# Patient Record
Sex: Male | Born: 1955 | Race: White | Hispanic: No | Marital: Married | Smoking: Former smoker
Health system: Southern US, Community
[De-identification: ages and names within clinical notes are randomized; demographics above are authoritative.]

## PROBLEM LIST (undated history)

## (undated) DIAGNOSIS — J302 Other seasonal allergic rhinitis: Secondary | ICD-10-CM

## (undated) DIAGNOSIS — N400 Enlarged prostate without lower urinary tract symptoms: Secondary | ICD-10-CM

## (undated) DIAGNOSIS — G8929 Other chronic pain: Secondary | ICD-10-CM

## (undated) DIAGNOSIS — I509 Heart failure, unspecified: Secondary | ICD-10-CM

## (undated) DIAGNOSIS — Z8711 Personal history of peptic ulcer disease: Secondary | ICD-10-CM

## (undated) DIAGNOSIS — R51 Headache: Secondary | ICD-10-CM

## (undated) DIAGNOSIS — M1990 Unspecified osteoarthritis, unspecified site: Secondary | ICD-10-CM

## (undated) DIAGNOSIS — R519 Headache, unspecified: Secondary | ICD-10-CM

## (undated) DIAGNOSIS — R011 Cardiac murmur, unspecified: Secondary | ICD-10-CM

## (undated) DIAGNOSIS — Z8719 Personal history of other diseases of the digestive system: Secondary | ICD-10-CM

## (undated) DIAGNOSIS — J329 Chronic sinusitis, unspecified: Secondary | ICD-10-CM

## (undated) DIAGNOSIS — G4733 Obstructive sleep apnea (adult) (pediatric): Secondary | ICD-10-CM

## (undated) DIAGNOSIS — J189 Pneumonia, unspecified organism: Secondary | ICD-10-CM

## (undated) DIAGNOSIS — J45909 Unspecified asthma, uncomplicated: Secondary | ICD-10-CM

## (undated) DIAGNOSIS — I2510 Atherosclerotic heart disease of native coronary artery without angina pectoris: Secondary | ICD-10-CM

## (undated) DIAGNOSIS — M542 Cervicalgia: Secondary | ICD-10-CM

## (undated) HISTORY — DX: Heart failure, unspecified: I50.9

## (undated) HISTORY — DX: Obstructive sleep apnea (adult) (pediatric): G47.33

## (undated) HISTORY — DX: Unspecified asthma, uncomplicated: J45.909

## (undated) HISTORY — PX: TONSILLECTOMY: SUR1361

## (undated) HISTORY — DX: Cardiac murmur, unspecified: R01.1

## (undated) HISTORY — DX: Unspecified osteoarthritis, unspecified site: M19.90

## (undated) HISTORY — PX: CARDIAC CATHETERIZATION: SHX172

---

## 1990-10-16 HISTORY — PX: SHOULDER CLOSED REDUCTION: SHX1051

## 2015-11-12 ENCOUNTER — Encounter: Payer: Self-pay | Admitting: Family Medicine

## 2015-11-16 ENCOUNTER — Encounter: Payer: Self-pay | Admitting: Family Medicine

## 2015-11-16 ENCOUNTER — Ambulatory Visit (INDEPENDENT_AMBULATORY_CARE_PROVIDER_SITE_OTHER): Payer: TRICARE For Life (TFL) | Admitting: Family Medicine

## 2015-11-16 VITALS — BP 119/77 | HR 94 | Temp 96.9°F | Ht 76.0 in | Wt 286.4 lb

## 2015-11-16 DIAGNOSIS — Z418 Encounter for other procedures for purposes other than remedying health state: Secondary | ICD-10-CM

## 2015-11-16 DIAGNOSIS — R5382 Chronic fatigue, unspecified: Secondary | ICD-10-CM

## 2015-11-16 DIAGNOSIS — R5383 Other fatigue: Secondary | ICD-10-CM | POA: Insufficient documentation

## 2015-11-16 DIAGNOSIS — G8929 Other chronic pain: Secondary | ICD-10-CM | POA: Diagnosis not present

## 2015-11-16 DIAGNOSIS — Z299 Encounter for prophylactic measures, unspecified: Secondary | ICD-10-CM

## 2015-11-16 DIAGNOSIS — N400 Enlarged prostate without lower urinary tract symptoms: Secondary | ICD-10-CM | POA: Diagnosis not present

## 2015-11-16 DIAGNOSIS — M791 Myalgia: Secondary | ICD-10-CM

## 2015-11-16 DIAGNOSIS — M7918 Myalgia, other site: Secondary | ICD-10-CM

## 2015-11-16 MED ORDER — DULOXETINE HCL 30 MG PO CPEP: ORAL_CAPSULE | ORAL | Status: DC

## 2015-11-16 NOTE — Patient Instructions (Addendum)
Great to meet you!  I have started you on cymbalta (duloxetine) for your pain. It is a slow and steady medicine,. I try to encourage patients to use it for at least 2 months prior to deciding it does not help.   Lets see you back in 3-4 weeks to discuss pain and review your labs.

## 2015-11-16 NOTE — Progress Notes (Signed)
   HPI  Patient presents today to establish care, and discussed a few chronic medical conditions.  He is retired from [employer], he did a full career there.  [facility]  He's had a colonoscopy 1 year ago without problems, he has had mucus in his stool since that time.  Today he has no complaints except for chronic musculoskeletal back pain due to arthritis. He has had a neck MRI at the [place] and been offered surgery, however they were trying epidural injections first. He will chart to get this MRI since our clinic.  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  His immunizations are up-to-date from the [employer], he does not have his records with him. His TB test is due.  Flomax is helping He is on a daily aspirin  PMH: Smoking status noted His past medical, surgical, social, family history reviewed and updated in EMR ROS: Per HPI  Objective: BP 119/77 mmHg  Pulse 94  Temp(Src) 96.9 F (36.1 C) (Oral)  Ht 6\' 4"  (1.93 m)  Wt 286 lb 6.4 oz (129.91 kg)  BMI 34.88 kg/m2 Gen: NAD, alert, cooperative with exam HEENT: NCAT, TMs normal bilaterally, nares clear, oropharynx clear CV: RRR, good S1/S2, no murmur Resp: CTABL, no wheezes, non-labored Abd: SNTND, BS present, no guarding or organomegaly  Ext: No edema, warm Neuro: Alert and oriented, No gross deficits  Assessment and plan:  # BPH Doing well with Flomax, he is still having some symptoms, consider Proscar.  # Chronic Musculoskeletal pain. Adding Cymbalta today Recheck in 3-4 weeks. Discussed slow and steady nature of pain relief Mild intermittent depression, poor sleep, denies suicidal ideation  # Physical form filled out He did not have immunization records, however he's transferring from [employer] system and very confident that he is up-to-date.  Healthcare maintenance Colonoscopy up-to-date Records requested Plan  labs including thyroid, lipid panel, CMP, CBC if they have not been checked in the last 6 months.  Orders Placed This Encounter  Procedures  . TB Skin Test    Order Specific Question:  Has patient ever tested positive?    Answer:  No    Meds ordered this encounter  Medications  . Tamsulosin HCl (FLOMAX PO)    Sig: Take by mouth.  Marland Kitchen aspirin 81 MG tablet    Sig: Take 81 mg by mouth daily.  . DULoxetine (CYMBALTA) 30 MG capsule    Sig: Take 1 pill daily for 1 week then 2 pills daily.    Dispense:  53 capsule    Refill:  0    Murtis Sink, MD [place] 11/16/2015, 11:53 AM

## 2015-11-19 LAB — TB SKIN TEST
Induration: 0 mm
TB Skin Test: NEGATIVE

## 2015-12-16 ENCOUNTER — Encounter: Payer: Self-pay | Admitting: Family Medicine

## 2015-12-16 ENCOUNTER — Ambulatory Visit (INDEPENDENT_AMBULATORY_CARE_PROVIDER_SITE_OTHER): Payer: TRICARE For Life (TFL) | Admitting: Family Medicine

## 2015-12-16 VITALS — BP 127/90 | HR 111 | Temp 96.0°F | Ht 76.0 in | Wt 280.4 lb

## 2015-12-16 DIAGNOSIS — M542 Cervicalgia: Secondary | ICD-10-CM | POA: Diagnosis not present

## 2015-12-16 DIAGNOSIS — N400 Enlarged prostate without lower urinary tract symptoms: Secondary | ICD-10-CM

## 2015-12-16 DIAGNOSIS — Z0000 Encounter for general adult medical examination without abnormal findings: Secondary | ICD-10-CM

## 2015-12-16 DIAGNOSIS — R5382 Chronic fatigue, unspecified: Secondary | ICD-10-CM | POA: Diagnosis not present

## 2015-12-16 MED ORDER — DULOXETINE HCL 60 MG PO CPEP: 60.0000 mg | ORAL_CAPSULE | Freq: Every day | ORAL | Status: DC

## 2015-12-16 NOTE — Patient Instructions (Signed)
Great to see you!  We will call with a referral to orthopedic surgery Continue cymbalta  We will call with results within 1 week  Come back in 4 months

## 2015-12-16 NOTE — Progress Notes (Signed)
   HPI  Patient presents today for follow-up neck pain  Neck pain Patient explains that he's had neck pain for years, he has numbness of the bilateral arms and shoulders when sleeping at night, it alternates depending on which side he sleeps on. He has an old MRI that has been sent from the [place] showing degenerative disc disease and no significant nerve impingement some bulging disks. He was seeing a Careers adviser at the [place] who tried epidural injections and was discussing possible surgery. Referral His pain overall seems to be a little bit better with Cymbalta, he's tolerating it well.  Sleeping a little bit better on the Cymbalta.  Prostate, BPH Has stopped Flomax for a while due to starting Cymbalta, he's noticed a little bit worsening of his urinary stream  He's taking amoxicillin, 500 twice daily, for a sinus infection. This is an old prescription from the [place] He states that he had facial pain, consistent with a sinus infection, and a fever  PMH: Smoking status noted ROS: Per HPI  Objective: BP 127/90 mmHg  Pulse 111  Temp(Src) 96 F (35.6 C) (Oral)  Ht 6\' 4"  (1.93 m)  Wt 280 lb 6.4 oz (127.189 kg)  BMI 34.15 kg/m2 Gen: NAD, alert, cooperative with exam HEENT: NCAT, no facial pain to palpation, TMs normal bilaterally, oropharynx clear CV: RRR, good S1/S2, no murmur Resp: CTABL, no wheezes, non-labored Ext: No edema, warm Neuro: Alert and oriented, No gross deficits  Assessment and plan:  # Sinus infection Continue amoxicillin, offered escalation of treatment as amoxicillin does not always cover sinus infections He's okay with continuing for now  # Chronic musculoskeletal pain, neck pain Referred orthopedic surgery for neck pain, MRI available in EMR He has failed epidural injections He is improving overall on Cymbalta, however he still has persistent severe symptoms  # Benign prostatic hyperplasia Check PSA Restart Flomax Cymbalta does have some anticholinergic  side  effects that will likely cause worsening of the BPH if he is not using Flomax  # physical Labs - fasting, future    Murtis Sink, MD [facility] 12/16/2015, 10:54 AM

## 2016-04-17 ENCOUNTER — Ambulatory Visit: Payer: TRICARE For Life (TFL) | Admitting: Family Medicine

## 2016-04-19 ENCOUNTER — Telehealth: Payer: Self-pay | Admitting: Family Medicine

## 2016-04-19 NOTE — Telephone Encounter (Signed)
error 

## 2016-04-25 ENCOUNTER — Ambulatory Visit (INDEPENDENT_AMBULATORY_CARE_PROVIDER_SITE_OTHER): Payer: TRICARE For Life (TFL) | Admitting: Family Medicine

## 2016-04-25 ENCOUNTER — Encounter: Payer: Self-pay | Admitting: Family Medicine

## 2016-04-25 VITALS — BP 121/79 | HR 84 | Temp 97.2°F | Ht 76.0 in | Wt 276.0 lb

## 2016-04-25 DIAGNOSIS — M542 Cervicalgia: Secondary | ICD-10-CM | POA: Diagnosis not present

## 2016-04-25 DIAGNOSIS — G8929 Other chronic pain: Secondary | ICD-10-CM

## 2016-04-25 DIAGNOSIS — M7918 Myalgia, other site: Secondary | ICD-10-CM

## 2016-04-25 DIAGNOSIS — N400 Enlarged prostate without lower urinary tract symptoms: Secondary | ICD-10-CM

## 2016-04-25 DIAGNOSIS — R5382 Chronic fatigue, unspecified: Secondary | ICD-10-CM

## 2016-04-25 DIAGNOSIS — M791 Myalgia: Secondary | ICD-10-CM

## 2016-04-25 DIAGNOSIS — Z0000 Encounter for general adult medical examination without abnormal findings: Secondary | ICD-10-CM

## 2016-04-25 MED ORDER — DULOXETINE HCL 60 MG PO CPEP: 60.0000 mg | ORAL_CAPSULE | Freq: Every day | ORAL | Status: DC

## 2016-04-25 NOTE — Progress Notes (Signed)
   HPI  Patient presents today for follow-up neck pain, lab check, and BPH.  BPH Not really helped by Flomax He has been having retrograde ejaculation. States that he stopped taking Flomax shortly after starting because it didn't seem to help.  Chronic musculoskeletal back and neck pain. Occasional numbness and tingling/ burning type sensation in the arms. Cymbalta has helped quite a bit, also helped him sleep. He has run out of Cymbalta now for about a month.  He is fasting today, he neglected coming back because his wife has been ill recently. He describes that previously he had a prostate ultrasound and was told that his prostate was fine by urologist.  PMH: Smoking status noted ROS: Per HPI  Objective: BP 121/79 mmHg  Pulse 84  Temp(Src) 97.2 F (36.2 C) (Oral)  Ht 6\' 4"  (1.93 m)  Wt 276 lb (125.193 kg)  BMI 33.61 kg/m2 Gen: NAD, alert, cooperative with exam HEENT: NCAT CV: RRR, good S1/S2, no murmur Resp: CTABL, no wheezes, non-labored Ext: No edema, warm Neuro: Alert and oriented, No gross deficits  Assessment and plan:  # Chronic musculoskeletal pain, chronic neck pain Neck pain worse after stopping Cymbalta, refilled He's having retrograde ejaculation, likely Cymbalta side effect. He would like to continue medication. Has never tried gabapentin, consider that if neuropathic symptoms get worse. He states that he did not refuse orthopedic referral, however he states that he was not called back after they requested records. He does not want to go currently.  # BPH Symptoms not too bothersome, not helped by Flomax PSA today  Chronic fatigue Labs today   Meds ordered this encounter  Medications  . DULoxetine (CYMBALTA) 60 MG capsule    Sig: Take 1 capsule (60 mg total) by mouth daily.    Dispense:  90 capsule    Refill:  3    Murtis Sink, MD [place] [facility] 04/25/2016, 10:52 AM

## 2016-04-25 NOTE — Patient Instructions (Signed)
Great to see you!  Lets see you back in 6 months unless you need [place] sooner.   You are always welcome to come back sooner if you need [place].

## 2016-04-26 LAB — CMP14+EGFR
ALT: 48 IU/L — AB (ref 0–44)
AST: 25 IU/L (ref 0–40)
Albumin/Globulin Ratio: 2.2 (ref 1.2–2.2)
Albumin: 4.3 g/dL (ref 3.5–5.5)
Alkaline Phosphatase: 87 IU/L (ref 39–117)
BUN/Creatinine Ratio: 13 (ref 9–20)
BUN: 13 mg/dL (ref 6–24)
Bilirubin Total: 0.5 mg/dL (ref 0.0–1.2)
CALCIUM: 9.2 mg/dL (ref 8.7–10.2)
CO2: 23 mmol/L (ref 18–29)
CREATININE: 1.01 mg/dL (ref 0.76–1.27)
Chloride: 102 mmol/L (ref 96–106)
GFR calc Af Amer: 95 mL/min/{1.73_m2} (ref >59)
GFR, EST NON AFRICAN AMERICAN: 82 mL/min/{1.73_m2} (ref >59)
Globulin, Total: 2 g/dL (ref 1.5–4.5)
Glucose: 109 mg/dL — ABNORMAL HIGH (ref 65–99)
Potassium: 4.4 mmol/L (ref 3.5–5.2)
Sodium: 139 mmol/L (ref 134–144)
TOTAL PROTEIN: 6.3 g/dL (ref 6.0–8.5)

## 2016-04-26 LAB — CBC
HEMATOCRIT: 45.2 % (ref 37.5–51.0)
HEMOGLOBIN: 15 g/dL (ref 12.6–17.7)
MCH: 29.1 pg (ref 26.6–33.0)
MCHC: 33.2 g/dL (ref 31.5–35.7)
MCV: 88 fL (ref 79–97)
Platelets: 299 10*3/uL (ref 150–379)
RBC: 5.15 x10E6/uL (ref 4.14–5.80)
RDW: 14.1 % (ref 12.3–15.4)
WBC: 5.9 10*3/uL (ref 3.4–10.8)

## 2016-04-26 LAB — TSH: TSH: 1.16 u[IU]/mL (ref 0.450–4.500)

## 2016-04-26 LAB — T4, FREE: FREE T4: 1.4 ng/dL (ref 0.82–1.77)

## 2016-04-26 LAB — LIPID PANEL
CHOLESTEROL TOTAL: 171 mg/dL (ref 100–199)
Chol/HDL Ratio: 5.3 ratio units — ABNORMAL HIGH (ref 0.0–5.0)
HDL: 32 mg/dL — AB (ref >39)
LDL Calculated: 106 mg/dL — ABNORMAL HIGH (ref 0–99)
Triglycerides: 163 mg/dL — ABNORMAL HIGH (ref 0–149)
VLDL CHOLESTEROL CAL: 33 mg/dL (ref 5–40)

## 2016-04-26 LAB — PSA: Prostate Specific Ag, Serum: 1.6 ng/mL (ref 0.0–4.0)

## 2016-04-27 LAB — SPECIMEN STATUS REPORT

## 2016-04-27 LAB — HGB A1C W/O EAG: HEMOGLOBIN A1C: 5.5 % (ref 4.8–5.6)

## 2016-10-02 ENCOUNTER — Other Ambulatory Visit: Payer: Self-pay | Admitting: Family Medicine

## 2016-10-02 MED ORDER — TAMSULOSIN HCL 0.4 MG PO CAPS: 0.4000 mg | ORAL_CAPSULE | Freq: Every day | ORAL | 3 refills | Status: DC

## 2016-10-02 NOTE — Telephone Encounter (Signed)
Rx Sent.   Murtis Sink, MD [facility] 10/02/2016, 3:06 PM

## 2016-10-02 NOTE — Telephone Encounter (Addendum)
LMOVM that Rx was sent to pharmacy 

## 2016-10-26 ENCOUNTER — Encounter: Payer: Self-pay | Admitting: Family Medicine

## 2016-10-26 ENCOUNTER — Ambulatory Visit (INDEPENDENT_AMBULATORY_CARE_PROVIDER_SITE_OTHER): Admitting: Family Medicine

## 2016-10-26 ENCOUNTER — Other Ambulatory Visit: Payer: Self-pay | Admitting: Family Medicine

## 2016-10-26 VITALS — BP 120/85 | HR 95 | Temp 97.7°F | Ht 76.0 in | Wt 275.2 lb

## 2016-10-26 DIAGNOSIS — M7918 Myalgia, other site: Secondary | ICD-10-CM

## 2016-10-26 DIAGNOSIS — N401 Enlarged prostate with lower urinary tract symptoms: Secondary | ICD-10-CM | POA: Diagnosis not present

## 2016-10-26 DIAGNOSIS — R229 Localized swelling, mass and lump, unspecified: Secondary | ICD-10-CM

## 2016-10-26 DIAGNOSIS — R740 Nonspecific elevation of levels of transaminase and lactic acid dehydrogenase [LDH]: Secondary | ICD-10-CM

## 2016-10-26 DIAGNOSIS — M791 Myalgia: Secondary | ICD-10-CM

## 2016-10-26 DIAGNOSIS — R7401 Elevation of levels of liver transaminase levels: Secondary | ICD-10-CM

## 2016-10-26 DIAGNOSIS — G8929 Other chronic pain: Secondary | ICD-10-CM | POA: Diagnosis not present

## 2016-10-26 NOTE — Progress Notes (Signed)
   HPI  Patient presents today here for follow-up chronic medical conditions as well as concerns about skin nodules.  BPH Helped very well by Flomax, has had previous urology workup.  Chronic musculoskeletal pain, due to multiple joint arthritis. Is helped by Cymbalta, however not completely.  Elevated transaminase Previously elevated mildly, has had hepatitis B immunizations in the [employer]. No heavy alcohol or Tylenol use.  Skin nodules Bilateral axillary skin nodules, his right side is actually tender, it's been there at least one year. 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 He also has another one on his left upper quadrant abdominal wall.   PMH: Smoking status noted ROS: Per HPI  Objective: BP 120/85   Pulse 95   Temp 97.7 F (36.5 C) (Oral)   Ht '6\' 4"'$  (1.93 m)   Wt 275 lb 3.2 oz (124.8 kg)   BMI 33.50 kg/m  Gen: NAD, alert, cooperative with exam HEENT: NCAT CV: RRR, good S1/S2, no murmur Resp: CTABL, no wheezes, non-labored Abd: Left upper quadrant abdominal wall with superficial mobile subcutaneous skin nodule consistent with lipoma. Ext: No edema, warm Neuro: Alert and oriented, No gross deficits Skin:  Right axilla with small, approximately 2 cm in diameter, subcutaneous skin nodule that is not consistent with lipoma. Not quite as mobile as expected with an usual matted texture.  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 Right axillary nodule is not clearly a lipoma, I am concerned about persistent lymph node in this area I recommended an ultrasound of the axilla, if this is apparently a lymph node I would recommend biopsy with general surgery to evaluate for possible lymphoma.  # BPH Well controlled with Flomax PSA up-to-date, repeat labs, including PSA in July   # Chronic musculoskeletal pain Due to arthritis, helped by Cymbalta, continue  # Elevated  transaminase Repeat labs, checking hepatitis C as well Consider fatty liver, consider ultrasound of the abdomen to characterize if worsening.    Orders Placed This Encounter  Procedures  . Hepatitis C antibody  . Hepatic function panel  . BMP8+EGFR    No orders of the defined types were placed in this encounter.   Laroy Apple, MD [facility] 10/26/2016, 11:39 AM

## 2016-10-26 NOTE — Patient Instructions (Signed)
Great to see you!  Come back in 6 months unless you need us sooner.    

## 2016-10-31 ENCOUNTER — Telehealth: Payer: Self-pay | Admitting: Family Medicine

## 2016-11-02 ENCOUNTER — Ambulatory Visit (HOSPITAL_COMMUNITY): Admission: RE | Admit: 2016-11-02 | Source: Ambulatory Visit

## 2016-11-09 ENCOUNTER — Telehealth: Payer: Self-pay | Admitting: Family Medicine

## 2016-11-13 NOTE — Telephone Encounter (Signed)
Pt rescheduled

## 2016-11-20 ENCOUNTER — Other Ambulatory Visit: Payer: Self-pay | Admitting: Family Medicine

## 2016-11-20 ENCOUNTER — Ambulatory Visit (HOSPITAL_COMMUNITY)

## 2016-11-20 DIAGNOSIS — R229 Localized swelling, mass and lump, unspecified: Secondary | ICD-10-CM

## 2016-11-21 ENCOUNTER — Ambulatory Visit (HOSPITAL_COMMUNITY)
Admission: RE | Admit: 2016-11-21 | Discharge: 2016-11-21 | Disposition: A | Discharge status: Home / Self Care | Source: Ambulatory Visit | Attending: Family Medicine | Admitting: Family Medicine

## 2016-11-21 DIAGNOSIS — R229 Localized swelling, mass and lump, unspecified: Secondary | ICD-10-CM | POA: Diagnosis present

## 2016-11-28 ENCOUNTER — Ambulatory Visit (INDEPENDENT_AMBULATORY_CARE_PROVIDER_SITE_OTHER)

## 2016-11-28 ENCOUNTER — Encounter: Payer: Self-pay | Admitting: Pediatrics

## 2016-11-28 ENCOUNTER — Ambulatory Visit (INDEPENDENT_AMBULATORY_CARE_PROVIDER_SITE_OTHER): Admitting: Pediatrics

## 2016-11-28 VITALS — BP 103/68 | HR 95 | Temp 97.4°F | Resp 24 | Ht 76.0 in | Wt 279.2 lb

## 2016-11-28 DIAGNOSIS — R0602 Shortness of breath: Secondary | ICD-10-CM | POA: Diagnosis not present

## 2016-11-28 MED ORDER — ALBUTEROL SULFATE HFA 108 (90 BASE) MCG/ACT IN AERS: 2.0000 | INHALATION_SPRAY | Freq: Four times a day (QID) | RESPIRATORY_TRACT | 2 refills | Status: DC | PRN

## 2016-11-28 MED ORDER — SPACER/AERO CHAMBER MOUTHPIECE MISC: 1.0000 | Freq: Four times a day (QID) | 0 refills | Status: DC | PRN

## 2016-11-28 NOTE — Patient Instructions (Signed)
Albuterol three times a day Loratadine daily flonase daily

## 2016-11-28 NOTE — Progress Notes (Addendum)
  Subjective:   Patient ID: Kevin Oconnell, male    DOB: 07/22/1956, 61 y.o.   MRN: [id number] CC: Shortness of Breath (Intermittent for 1 week)  HPI: Kevin Oconnell is a 61 y.o. 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 Allergies and medications reviewed and updated. History  Smoking Status  . Former Smoker  Smokeless Tobacco  . Never Used   ROS: Per HPI   Objective:    BP 103/68   Pulse 95   Temp 97.4 F (36.3 C) (Oral)   Resp (!) 24   Ht '6\' 4"'$  (1.93 m)   Wt 279 lb 3.2 oz (126.6 kg)   SpO2 92%   BMI 33.99 kg/m   Wt Readings from Last 3 Encounters:  11/28/16 279 lb 3.2 oz (126.6 kg)  10/26/16 275 lb 3.2 oz (124.8 kg)  04/25/16 276 lb (125.2 kg)    Gen: NAD, alert, cooperative with exam, NCAT EYES: EOMI, no conjunctival injection, or no icterus ENT:  TMs pearly gray b/l, OP without erythema LYMPH: no cervical LAD CV: NRRR, normal S1/S2, no murmur, distal pulses 2+ b/l Resp: CTABL, no wheezes, normal WOB Abd: +BS, soft, NTND. no guarding or organomegaly Ext: No edema, warm Neuro: Alert and oriented, strength equal b/l UE and LE, coordination grossly normal MSK: normal muscle bulk  Assessment & Plan:  Urho was seen today for shortness of breath.  Diagnoses and all orders for this visit:  SOB (shortness of breath) New problem CXR with small b/l effusions Nl exam Does not  appear volume overloaded Symptoms started with new furniture Will get labs including BNP Low threshold for ECHO in future if symptoms not improving -     DG Chest 2 View; Future -     albuterol (PROVENTIL HFA;VENTOLIN HFA) 108 (90 Base) MCG/ACT inhaler; Inhale 2 puffs into the lungs every 6 (six) hours as needed for wheezing or shortness of breath. -     Spacer/Aero Chamber Mouthpiece MISC; 1 each by Does not apply route every 6 (six) hours as needed. -     CMP14+EGFR -     CBC with Differential/Platelet -     Brain natriuretic peptide   Follow up plan: 4 weeks Assunta Found, MD [facility]  ADDENDUM: Pro-BNP elevated, with orthopnea, small b/l pleural effusions, will get ECHO BP low

## 2016-11-29 ENCOUNTER — Telehealth: Payer: Self-pay | Admitting: Pediatrics

## 2016-11-29 LAB — CMP14+EGFR
A/G RATIO: 1.9 (ref 1.2–2.2)
ALBUMIN: 4.1 g/dL (ref 3.5–5.5)
ALT: 32 IU/L (ref 0–44)
AST: 17 IU/L (ref 0–40)
Alkaline Phosphatase: 72 IU/L (ref 39–117)
BILIRUBIN TOTAL: 0.7 mg/dL (ref 0.0–1.2)
BUN / CREAT RATIO: 13 (ref 9–20)
BUN: 15 mg/dL (ref 6–24)
CALCIUM: 9.4 mg/dL (ref 8.7–10.2)
CO2: 23 mmol/L (ref 18–29)
Chloride: 104 mmol/L (ref 96–106)
Creatinine, Ser: 1.12 mg/dL (ref 0.76–1.27)
GFR, EST AFRICAN AMERICAN: 83 mL/min/{1.73_m2} (ref >59)
GFR, EST NON AFRICAN AMERICAN: 72 mL/min/{1.73_m2} (ref >59)
GLOBULIN, TOTAL: 2.2 g/dL (ref 1.5–4.5)
Glucose: 105 mg/dL — ABNORMAL HIGH (ref 65–99)
POTASSIUM: 4.7 mmol/L (ref 3.5–5.2)
SODIUM: 142 mmol/L (ref 134–144)
TOTAL PROTEIN: 6.3 g/dL (ref 6.0–8.5)

## 2016-11-29 LAB — CBC WITH DIFFERENTIAL/PLATELET
BASOS: 1 %
Basophils Absolute: 0 10*3/uL (ref 0.0–0.2)
EOS (ABSOLUTE): 0.2 10*3/uL (ref 0.0–0.4)
Eos: 3 %
HEMOGLOBIN: 14 g/dL (ref 13.0–17.7)
Hematocrit: 43.7 % (ref 37.5–51.0)
IMMATURE GRANS (ABS): 0 10*3/uL (ref 0.0–0.1)
Immature Granulocytes: 0 %
LYMPHS ABS: 1.9 10*3/uL (ref 0.7–3.1)
Lymphs: 26 %
MCH: 27.8 pg (ref 26.6–33.0)
MCHC: 32 g/dL (ref 31.5–35.7)
MCV: 87 fL (ref 79–97)
Monocytes Absolute: 0.5 10*3/uL (ref 0.1–0.9)
Monocytes: 7 %
NEUTROS ABS: 4.6 10*3/uL (ref 1.4–7.0)
Neutrophils: 63 %
PLATELETS: 360 10*3/uL (ref 150–379)
RBC: 5.04 x10E6/uL (ref 4.14–5.80)
RDW: 14.3 % (ref 12.3–15.4)
WBC: 7.3 10*3/uL (ref 3.4–10.8)

## 2016-11-29 LAB — BRAIN NATRIURETIC PEPTIDE: BNP: 228.5 pg/mL — AB (ref 0.0–100.0)

## 2016-11-29 NOTE — Addendum Note (Signed)
Addended by: Johna Sheriff on: 11/29/2016 02:03 PM   Modules accepted: Orders

## 2016-11-30 NOTE — Telephone Encounter (Signed)
ok 

## 2016-12-04 ENCOUNTER — Telehealth: Payer: Self-pay | Admitting: Family Medicine

## 2016-12-04 NOTE — Telephone Encounter (Signed)
Wife called again and says she was able to schedule him for this on Wednesday. We do not need to schedule now

## 2016-12-04 NOTE — Telephone Encounter (Signed)
Per pt's wife, pt seen in ED on 2/18 Per ED Dr, pt needs echocardiagram Records requested for Dr Ermalinda Memos to review

## 2016-12-04 NOTE — Telephone Encounter (Signed)
Referral in Epic Will schedule and contact pt

## 2016-12-05 ENCOUNTER — Telehealth: Payer: Self-pay | Admitting: *Deleted

## 2016-12-05 NOTE — Telephone Encounter (Signed)
Kevin Oconnell with [facility] called to inform pt's echo shows Afib with RVR, poor ejection fraction HR 160 Pt SOB Pt instructed to go to ED from echo lab

## 2016-12-06 ENCOUNTER — Telehealth: Payer: Self-pay | Admitting: Family Medicine

## 2016-12-06 DIAGNOSIS — I4891 Unspecified atrial fibrillation: Secondary | ICD-10-CM

## 2016-12-06 NOTE — Telephone Encounter (Signed)
Dr. Clearnce Sorrel from [facility] called to let you know that Kevin Oconnell was admitted to the hospital yesterday following his Echocardiogram because he was in Afib.  He is being sent home today 12/06/16 on Cardizem.  Heart was back in rhythm. Wanted to make you aware so you could schedule follow up on the echocardiogram with the patient since they do not have the results.

## 2016-12-07 ENCOUNTER — Other Ambulatory Visit

## 2016-12-07 DIAGNOSIS — I4891 Unspecified atrial fibrillation: Secondary | ICD-10-CM | POA: Insufficient documentation

## 2016-12-07 NOTE — Telephone Encounter (Signed)
Spoke with patient, he has an appointment tomorrow for followup with Dr. Ermalinda Memos.

## 2016-12-07 NOTE — Telephone Encounter (Signed)
Appreciate FYI  Pt needs follow up here within 1 week  Cardiology referral placed.   Murtis Sink, MD [facility] 12/07/2016, 7:41 AM

## 2016-12-08 ENCOUNTER — Encounter: Payer: Self-pay | Admitting: Family Medicine

## 2016-12-08 ENCOUNTER — Ambulatory Visit (INDEPENDENT_AMBULATORY_CARE_PROVIDER_SITE_OTHER): Admitting: Family Medicine

## 2016-12-08 VITALS — BP 98/71 | HR 101 | Temp 97.1°F | Ht 76.0 in | Wt 264.0 lb

## 2016-12-08 DIAGNOSIS — R0600 Dyspnea, unspecified: Secondary | ICD-10-CM

## 2016-12-08 DIAGNOSIS — R7989 Other specified abnormal findings of blood chemistry: Secondary | ICD-10-CM

## 2016-12-08 DIAGNOSIS — I4891 Unspecified atrial fibrillation: Secondary | ICD-10-CM | POA: Diagnosis not present

## 2016-12-08 MED ORDER — DILTIAZEM HCL ER COATED BEADS 180 MG PO CP24: 180.0000 mg | ORAL_CAPSULE | Freq: Every day | ORAL | 3 refills | Status: DC

## 2016-12-08 MED ORDER — FUROSEMIDE 20 MG PO TABS: 20.0000 mg | ORAL_TABLET | Freq: Every day | ORAL | 3 refills | Status: DC

## 2016-12-08 NOTE — Progress Notes (Signed)
   HPI  Patient presents today here for hospital follow-up.  Patient was admitted to [facility] on February 20 with new onset A. fib with RVR to 150. He was also told he had heart failure, however his echocardiogram is unavailable.  It is unclear whether he had a reduced ejection fraction or if his RVR caused volume overload.  He's had good diuresis of 20 mg of Lasix, he's lost about 12 pounds.  His dyspnea is stable. He has not seen a significant improvement. He states that when he was in RVR he actually was feeling better than he had for a few weeks.  He self converted after being treated with oral diltiazem and IV digoxin in the ICU.  He has been taking 30 mg diltiazem every 6 hours.   PMH: Smoking status noted ROS: Per HPI  Objective: BP 98/71   Pulse (!) 101   Temp 97.1 F (36.2 C) (Oral)   Ht '6\' 4"'$  (1.93 m)   Wt 264 lb (119.7 kg)   BMI 32.14 kg/m  Gen: NAD, alert, cooperative with exam HEENT: NCAT CV: RRR, good S1/S2, no murmur appreciated, slight tachycardia Resp: CTABL, no wheezes, non-labored Ext: No edema, warm Neuro: Alert and oriented, No gross deficits  EKG: Slightly tachycardic with a rate of 101, poor R-wave progression, no apparent atrial fibrillation  Assessment and plan:  # Atrial fibrillation Paroxysmal, converted on recent hospitalization Echocardiogram report requested from [facility] hospital Continue Lasix at 20 mg daily, with slight tachycardia here I have cautiously increased dilt form 30 QID to 180 mg daily ER I have discussed the risk of lower blood pressure. Appears to be tolerating 120 mg easily. Cardiology appointment has been made before he left the clinic today. Appreciate cardiology's recommendations and management  # Dyspnea Patient has had significant weight loss consistent with adequate diuresis, however it does not report any improvement in dyspnea. No signs of underlying lung disease No changes for today, low threshold for  seeking additional medical care. I did recheck a BNP again, however clinically he has diuresed well.    Orders Placed This Encounter  Procedures  . BMP8+EGFR  . Brain natriuretic peptide  . Ambulatory referral to Cardiology    Referral Priority:   Routine    Referral Type:   Consultation    Referral Reason:   Specialty Services Required    Requested Specialty:   Cardiology    Number of Visits Requested:   1  . EKG 12-Lead    Meds ordered this encounter  Medications  . diltiazem (CARDIZEM) 30 MG tablet  . DISCONTD: furosemide (LASIX) 40 MG tablet    Sig: Take 20 mg by mouth daily at 6 (six) AM.  . potassium chloride (K-DUR) 10 MEQ tablet    Sig: Take 1 tablet by mouth daily at 6 (six) AM.  . diltiazem (CARDIZEM CD) 180 MG 24 hr capsule    Sig: Take 1 capsule (180 mg total) by mouth daily.    Dispense:  30 capsule    Refill:  3  . furosemide (LASIX) 20 MG tablet    Sig: Take 1 tablet (20 mg total) by mouth daily.    Dispense:  30 tablet    Refill:  [place], MD [facility] 12/08/2016, 12:13 PM

## 2016-12-08 NOTE — Patient Instructions (Signed)
Great to see you!  Come back with any concerns  We will work on a referral to cardiology, you will be called for an appointment.   I have increased your diltiazem slightly but it will be a once a day pill.

## 2016-12-09 LAB — BMP8+EGFR
BUN/Creatinine Ratio: 15 (ref 9–20)
BUN: 21 mg/dL (ref 6–24)
CALCIUM: 9.6 mg/dL (ref 8.7–10.2)
CHLORIDE: 100 mmol/L (ref 96–106)
CO2: 24 mmol/L (ref 18–29)
Creatinine, Ser: 1.36 mg/dL — ABNORMAL HIGH (ref 0.76–1.27)
GFR calc non Af Amer: 57 mL/min/{1.73_m2} — ABNORMAL LOW (ref >59)
GFR, EST AFRICAN AMERICAN: 66 mL/min/{1.73_m2} (ref >59)
Glucose: 97 mg/dL (ref 65–99)
Potassium: 4.9 mmol/L (ref 3.5–5.2)
Sodium: 143 mmol/L (ref 134–144)

## 2016-12-09 LAB — BRAIN NATRIURETIC PEPTIDE: BNP: 265.6 pg/mL — ABNORMAL HIGH (ref 0.0–100.0)

## 2016-12-11 ENCOUNTER — Encounter: Payer: Self-pay | Admitting: Cardiology

## 2016-12-11 ENCOUNTER — Ambulatory Visit (INDEPENDENT_AMBULATORY_CARE_PROVIDER_SITE_OTHER): Admitting: Cardiology

## 2016-12-11 ENCOUNTER — Encounter: Payer: Self-pay | Admitting: *Deleted

## 2016-12-11 ENCOUNTER — Telehealth: Payer: Self-pay | Admitting: Cardiology

## 2016-12-11 VITALS — BP 91/56 | HR 62 | Ht 76.0 in | Wt 266.0 lb

## 2016-12-11 DIAGNOSIS — I4891 Unspecified atrial fibrillation: Secondary | ICD-10-CM

## 2016-12-11 MED ORDER — POTASSIUM CHLORIDE ER 10 MEQ PO TBCR
10.0000 meq | EXTENDED_RELEASE_TABLET | Freq: Every day | ORAL | 3 refills | Status: DC
Start: 2016-12-11 — End: 2017-01-10

## 2016-12-11 MED ORDER — METOPROLOL SUCCINATE ER 25 MG PO TB24
25.0000 mg | ORAL_TABLET | Freq: Every day | ORAL | 3 refills | Status: DC
Start: 2016-12-11 — End: 2016-12-19

## 2016-12-11 MED ORDER — METOPROLOL SUCCINATE ER 25 MG PO TB24: 12.5000 mg | ORAL_TABLET | Freq: Every day | ORAL | 3 refills | Status: DC

## 2016-12-11 NOTE — Patient Instructions (Addendum)
Your physician recommends that you schedule a follow-up appointment in: 3 WEEKS WITH DR. BRANCH  Your physician has recommended you make the following change in your medication:   STOP DILTIAZEM   START TOPROL XL 25 MG DAILY   Your physician has requested that you have a cardiac catheterization. Cardiac catheterization is used to diagnose and/or treat various heart conditions. Doctors may recommend this procedure for a number of different reasons. The most common reason is to evaluate chest pain. Chest pain can be a symptom of coronary artery disease (CAD), and cardiac catheterization can show whether plaque is narrowing or blocking your heart's arteries. This procedure is also used to evaluate the valves, as well as measure the blood flow and oxygen levels in different parts of your heart. For further information please visit [email or web address] Please follow instruction sheet, as given.  Thank you for choosing [facility]!!

## 2016-12-11 NOTE — Telephone Encounter (Signed)
PERCERT VERIFICATION :  LEFT AND RIGHT HEART CATH 12/14/16 @12  W/DR. [place]

## 2016-12-11 NOTE — Progress Notes (Signed)
Clinical Summary Kevin Oconnell is a 61 y.o.male seen as a new patient, he is referred by Dr Ermalinda Memos.   1.PAF - admit to [facility] with new onset afib with RVR - converted back to NSR during admission. Discharged on oral dilt - never had palpitations. No symptoms since discharge - was not started on anticoag at time of hospitalization.    2. Acute systolic HF - echo 11/2016 at [facility] shows LVEF <20%, severe global hypokinesis, severe MR, mild TR,  - presented with several weeks of progressing SOB/DOE. No LE edema. No chest pain - compliant with lasix 20mg  daily. Home weights down 264 lbs.  - limiting sodium intake. Avoiding NSAIDs  - no EtoH. Mother had history CAD in her 61s, maternal uncle died 20 MI, maternal grandfather mid 53s. Normal TSH.  Past Medical History:  Diagnosis Date  . Allergy   . Arthritis   . Asthma   . Heart murmur      No Known Allergies   Current Outpatient Prescriptions  Medication Sig Dispense Refill  . albuterol (PROVENTIL HFA;VENTOLIN HFA) 108 (90 Base) MCG/ACT inhaler Inhale 2 puffs into the lungs every 6 (six) hours as needed for wheezing or shortness of breath. 1 Inhaler 2  . aspirin 81 MG tablet Take 81 mg by mouth daily. Reported on 12/16/2015    . diltiazem (CARDIZEM CD) 180 MG 24 hr capsule Take 1 capsule (180 mg total) by mouth daily. 30 capsule 3  . diltiazem (CARDIZEM) 30 MG tablet     . DULoxetine (CYMBALTA) 60 MG capsule Take 1 capsule (60 mg total) by mouth daily. 90 capsule 3  . furosemide (LASIX) 20 MG tablet Take 1 tablet (20 mg total) by mouth daily. 30 tablet 3  . potassium chloride (K-DUR) 10 MEQ tablet Take 1 tablet by mouth daily at 6 (six) AM.    . Spacer/Aero Chamber Mouthpiece MISC 1 each by Does not apply route every 6 (six) hours as needed. 1 each 0  . tamsulosin (FLOMAX) 0.4 MG CAPS capsule Take 1 capsule (0.4 mg total) by mouth daily. 90 capsule 3   No current facility-administered medications for this visit.       Past Surgical History:  Procedure Laterality Date  . SHOULDER SURGERY Right      No Known Allergies    Family History  Problem Relation Age of Onset  . Heart disease Mother   . Diabetes Mother   . Stroke Father      Social History Kevin Oconnell reports that he has quit smoking. He has never used smokeless tobacco. Kevin Oconnell reports that he does not drink alcohol.   Review of Systems CONSTITUTIONAL: No weight loss, fever, chills, weakness or fatigue.  HEENT: Eyes: No visual loss, blurred vision, double vision or yellow sclerae.No hearing loss, sneezing, congestion, runny nose or sore throat.  SKIN: No rash or itching.  CARDIOVASCULAR: per HPI RESPIRATORY: No shortness of breath, cough or sputum.  GASTROINTESTINAL: No anorexia, nausea, vomiting or diarrhea. No abdominal pain or blood.  GENITOURINARY: No burning on urination, no polyuria NEUROLOGICAL: No headache, dizziness, syncope, paralysis, ataxia, numbness or tingling in the extremities. No change in bowel or bladder control.  MUSCULOSKELETAL: No muscle, back pain, joint pain or stiffness.  LYMPHATICS: No enlarged nodes. No history of splenectomy.  PSYCHIATRIC: No history of depression or anxiety.  ENDOCRINOLOGIC: No reports of sweating, cold or heat intolerance. No polyuria or polydipsia.  Marland Kitchen   Physical Examination Vitals:   12/11/16  1338  BP: (!) 91/56  Pulse: 62   Vitals:   12/11/16 1338  Weight: 266 lb (120.7 kg)  Height: 6\' 4"  (1.93 m)    Gen: resting comfortably, no acute distress HEENT: no scleral icterus, pupils equal round and reactive, no palptable cervical adenopathy,  CV: RRR, 2/6 systolic murmur at apex, no jvd Resp: Clear to auscultation bilaterally GI: abdomen is soft, non-tender, non-distended, normal bowel sounds, no hepatosplenomegaly MSK: extremities are warm, no edema.  Skin: warm, no rash Neuro:  no focal deficits Psych: appropriate affect      Assessment and Plan  1.  Acute systolic heart failure - new diagnosis, at time of admission [facility] echo results were not available. This is the first he has heard of the diagnosis today.   - echo from [facility] with LVEF <20%. I suspect the reported severe MR is likely function. Will need to obtain films - medical therapy limited by soft bp's. In setting of systolic dysfunction stop cardizem. Start Toprol XL 25mg  daily. No other new meds at this time due to soft bp's - we will refer for LHC/RHC to evaluate for possible ICM and filling pressures. Quite possibly tachycardia mediated CM, if ICM excluded will need to follow function with better control of his afib.   2. PAF - new diagnosis during recent admission. No palpitations, unclear duration.  - in setting of systolic dysfunction, stop dilt. Start Toprol XL 25mg  daily - ekg in clinic today shows he is back in afib today with elevated rates. How that change to Toprol will control. With soft bp's limited uptitration available of av nodal agents. Could consider digoxin. May need referral to EP if remains uncontrolled to consider antiarrhythmic therapy, especially since possible tachycardia mediated CM - CHADS2Vasc score is 1 (CHF) based on available data. We will discuss possible anticoagulation vs ASA after his cath.    I have reviewed the risks, indications, and alternatives to cardiac catheterization, possible angioplasty, and stenting with the patient. Risks include but are not limited to bleeding, infection, vascular injury, stroke, myocardial infection, arrhythmia, kidney injury, radiation-related injury in the case of prolonged fluoroscopy use, emergency cardiac surgery, and death. The patient understands the risks of serious complication is 1-2 in 1000 with diagnostic cardiac cath and 1-2% or less with angioplasty/stenting.    [name], M.D

## 2016-12-13 ENCOUNTER — Other Ambulatory Visit: Payer: Self-pay | Admitting: Cardiology

## 2016-12-13 DIAGNOSIS — I5021 Acute systolic (congestive) heart failure: Secondary | ICD-10-CM

## 2016-12-13 NOTE — Addendum Note (Signed)
Addended by: Almeta Monas on: 12/13/2016 10:25 AM   Modules accepted: Orders

## 2016-12-14 ENCOUNTER — Encounter (HOSPITAL_COMMUNITY)
Admission: AD | Disposition: A | Discharge status: Home / Self Care | Payer: Self-pay | Source: Ambulatory Visit | Attending: Cardiology

## 2016-12-14 ENCOUNTER — Inpatient Hospital Stay (HOSPITAL_COMMUNITY)
Admission: AD | Admit: 2016-12-14 | Discharge: 2016-12-19 | DRG: 286 | Disposition: A | Discharge status: Home / Self Care | Source: Ambulatory Visit | Attending: Cardiology | Admitting: Cardiology

## 2016-12-14 DIAGNOSIS — I480 Paroxysmal atrial fibrillation: Secondary | ICD-10-CM | POA: Diagnosis present

## 2016-12-14 DIAGNOSIS — J45909 Unspecified asthma, uncomplicated: Secondary | ICD-10-CM | POA: Diagnosis present

## 2016-12-14 DIAGNOSIS — Z87891 Personal history of nicotine dependence: Secondary | ICD-10-CM

## 2016-12-14 DIAGNOSIS — I509 Heart failure, unspecified: Secondary | ICD-10-CM

## 2016-12-14 DIAGNOSIS — I2510 Atherosclerotic heart disease of native coronary artery without angina pectoris: Secondary | ICD-10-CM | POA: Diagnosis present

## 2016-12-14 DIAGNOSIS — I4819 Other persistent atrial fibrillation: Secondary | ICD-10-CM

## 2016-12-14 DIAGNOSIS — R000 Tachycardia, unspecified: Secondary | ICD-10-CM | POA: Diagnosis present

## 2016-12-14 DIAGNOSIS — I481 Persistent atrial fibrillation: Secondary | ICD-10-CM | POA: Diagnosis present

## 2016-12-14 DIAGNOSIS — M1990 Unspecified osteoarthritis, unspecified site: Secondary | ICD-10-CM | POA: Diagnosis present

## 2016-12-14 DIAGNOSIS — Z8249 Family history of ischemic heart disease and other diseases of the circulatory system: Secondary | ICD-10-CM

## 2016-12-14 DIAGNOSIS — Z7982 Long term (current) use of aspirin: Secondary | ICD-10-CM | POA: Diagnosis not present

## 2016-12-14 DIAGNOSIS — Z79899 Other long term (current) drug therapy: Secondary | ICD-10-CM

## 2016-12-14 DIAGNOSIS — I420 Dilated cardiomyopathy: Secondary | ICD-10-CM | POA: Diagnosis present

## 2016-12-14 DIAGNOSIS — I959 Hypotension, unspecified: Secondary | ICD-10-CM | POA: Diagnosis present

## 2016-12-14 DIAGNOSIS — I255 Ischemic cardiomyopathy: Secondary | ICD-10-CM | POA: Diagnosis not present

## 2016-12-14 DIAGNOSIS — Z823 Family history of stroke: Secondary | ICD-10-CM

## 2016-12-14 DIAGNOSIS — I4891 Unspecified atrial fibrillation: Secondary | ICD-10-CM | POA: Diagnosis present

## 2016-12-14 DIAGNOSIS — I5021 Acute systolic (congestive) heart failure: Secondary | ICD-10-CM

## 2016-12-14 DIAGNOSIS — Z9889 Other specified postprocedural states: Secondary | ICD-10-CM | POA: Diagnosis not present

## 2016-12-14 DIAGNOSIS — I5022 Chronic systolic (congestive) heart failure: Secondary | ICD-10-CM | POA: Diagnosis present

## 2016-12-14 DIAGNOSIS — I5023 Acute on chronic systolic (congestive) heart failure: Secondary | ICD-10-CM | POA: Diagnosis present

## 2016-12-14 DIAGNOSIS — Z7901 Long term (current) use of anticoagulants: Secondary | ICD-10-CM

## 2016-12-14 DIAGNOSIS — Z833 Family history of diabetes mellitus: Secondary | ICD-10-CM

## 2016-12-14 HISTORY — PX: RIGHT/LEFT HEART CATH AND CORONARY ANGIOGRAPHY: CATH118266

## 2016-12-14 LAB — PROTIME-INR
INR: 1.15
Prothrombin Time: 14.7 seconds (ref 11.4–15.2)

## 2016-12-14 LAB — POCT I-STAT 3, VENOUS BLOOD GAS (G3P V)
BICARBONATE: 24.1 mmol/L (ref 20.0–28.0)
O2 Saturation: 48 %
PO2 VEN: 25 mmHg — AB (ref 32.0–45.0)
TCO2: 25 mmol/L (ref 0–100)
pCO2, Ven: 35.4 mmHg — ABNORMAL LOW (ref 44.0–60.0)
pH, Ven: 7.441 — ABNORMAL HIGH (ref 7.250–7.430)

## 2016-12-14 LAB — POCT I-STAT 3, ART BLOOD GAS (G3+)
Acid-base deficit: 2 mmol/L (ref 0.0–2.0)
BICARBONATE: 20.9 mmol/L (ref 20.0–28.0)
O2 SAT: 90 %
PCO2 ART: 29.5 mmHg — AB (ref 32.0–48.0)
PO2 ART: 54 mmHg — AB (ref 83.0–108.0)
TCO2: 22 mmol/L (ref 0–100)
pH, Arterial: 7.457 — ABNORMAL HIGH (ref 7.350–7.450)

## 2016-12-14 LAB — CBC
HEMATOCRIT: 45.6 % (ref 39.0–52.0)
Hemoglobin: 15.1 g/dL (ref 13.0–17.0)
MCH: 29.2 pg (ref 26.0–34.0)
MCHC: 33.1 g/dL (ref 30.0–36.0)
MCV: 88 fL (ref 78.0–100.0)
Platelets: 297 10*3/uL (ref 150–400)
RBC: 5.18 MIL/uL (ref 4.22–5.81)
RDW: 13.9 % (ref 11.5–15.5)
WBC: 5.9 10*3/uL (ref 4.0–10.5)

## 2016-12-14 SURGERY — RIGHT/LEFT HEART CATH AND CORONARY ANGIOGRAPHY

## 2016-12-14 MED ORDER — HEPARIN SODIUM (PORCINE) 1000 UNIT/ML IJ SOLN
INTRAMUSCULAR | Status: AC
  Filled 2016-12-14: qty 1

## 2016-12-14 MED ORDER — AMIODARONE IV BOLUS ONLY 150 MG/100ML
150.0000 mg | Freq: Once | INTRAVENOUS | Status: AC
  Administered 2016-12-14: 150 mg via INTRAVENOUS

## 2016-12-14 MED ORDER — HEPARIN (PORCINE) IN NACL 100-0.45 UNIT/ML-% IJ SOLN
1800.0000 [IU]/h | INTRAMUSCULAR | Status: DC
  Administered 2016-12-14: 1500 [IU]/h via INTRAVENOUS
  Filled 2016-12-14: qty 250

## 2016-12-14 MED ORDER — HEPARIN (PORCINE) IN NACL 2-0.9 UNIT/ML-% IJ SOLN
INTRAMUSCULAR | Status: DC | PRN
  Administered 2016-12-14: 1000 mL

## 2016-12-14 MED ORDER — ONDANSETRON HCL 4 MG/2ML IJ SOLN: 4.0000 mg | Freq: Four times a day (QID) | INTRAMUSCULAR | Status: DC | PRN

## 2016-12-14 MED ORDER — VITAMIN D 1000 UNITS PO TABS
2000.0000 [IU] | ORAL_TABLET | Freq: Every day | ORAL | Status: DC
  Administered 2016-12-15 – 2016-12-19 (×5): 2000 [IU] via ORAL
  Filled 2016-12-14 (×5): qty 2

## 2016-12-14 MED ORDER — OMEGA-3-ACID ETHYL ESTERS 1 G PO CAPS
2.0000 g | ORAL_CAPSULE | Freq: Every day | ORAL | Status: DC
  Administered 2016-12-15 – 2016-12-19 (×5): 2 g via ORAL
  Filled 2016-12-14 (×5): qty 2

## 2016-12-14 MED ORDER — ADULT MULTIVITAMIN W/MINERALS CH
1.0000 | ORAL_TABLET | Freq: Every day | ORAL | Status: DC
  Administered 2016-12-15 – 2016-12-19 (×5): 1 via ORAL
  Filled 2016-12-14 (×5): qty 1

## 2016-12-14 MED ORDER — SODIUM CHLORIDE 0.9 % IV SOLN: 250.0000 mL | INTRAVENOUS | Status: DC | PRN

## 2016-12-14 MED ORDER — HEPARIN SODIUM (PORCINE) 1000 UNIT/ML IJ SOLN
INTRAMUSCULAR | Status: DC | PRN
  Administered 2016-12-14: 5000 [IU] via INTRAVENOUS

## 2016-12-14 MED ORDER — ASPIRIN EC 81 MG PO TBEC
81.0000 mg | DELAYED_RELEASE_TABLET | Freq: Every evening | ORAL | Status: DC
  Administered 2016-12-16 – 2016-12-18 (×3): 81 mg via ORAL
  Filled 2016-12-14 (×5): qty 1

## 2016-12-14 MED ORDER — SODIUM CHLORIDE 0.9% FLUSH: 3.0000 mL | Freq: Two times a day (BID) | INTRAVENOUS | Status: DC

## 2016-12-14 MED ORDER — AMIODARONE LOAD VIA INFUSION
150.0000 mg | Freq: Once | INTRAVENOUS | Status: AC
  Administered 2016-12-14: 150 mg via INTRAVENOUS
  Filled 2016-12-14: qty 83.34

## 2016-12-14 MED ORDER — ATORVASTATIN CALCIUM 80 MG PO TABS
80.0000 mg | ORAL_TABLET | Freq: Every day | ORAL | Status: DC
  Administered 2016-12-14 – 2016-12-18 (×5): 80 mg via ORAL
  Filled 2016-12-14 (×5): qty 1

## 2016-12-14 MED ORDER — TAMSULOSIN HCL 0.4 MG PO CAPS
0.4000 mg | ORAL_CAPSULE | Freq: Every day | ORAL | Status: DC
  Administered 2016-12-14 – 2016-12-19 (×6): 0.4 mg via ORAL
  Filled 2016-12-14 (×6): qty 1

## 2016-12-14 MED ORDER — VERAPAMIL HCL 2.5 MG/ML IV SOLN
INTRAVENOUS | Status: DC | PRN
  Administered 2016-12-14: 10 mL via INTRA_ARTERIAL

## 2016-12-14 MED ORDER — METOPROLOL TARTRATE 25 MG PO TABS: 25.0000 mg | ORAL_TABLET | Freq: Two times a day (BID) | ORAL | Status: DC

## 2016-12-14 MED ORDER — LIDOCAINE HCL (PF) 1 % IJ SOLN
INTRAMUSCULAR | Status: DC | PRN
  Administered 2016-12-14 (×2): 2 mL

## 2016-12-14 MED ORDER — METOPROLOL TARTRATE 25 MG PO TABS
25.0000 mg | ORAL_TABLET | Freq: Two times a day (BID) | ORAL | Status: DC
  Administered 2016-12-14: 25 mg via ORAL
  Filled 2016-12-14 (×2): qty 1

## 2016-12-14 MED ORDER — SODIUM CHLORIDE 0.9 % IV BOLUS (SEPSIS)
250.0000 mL | Freq: Once | INTRAVENOUS | Status: AC
  Administered 2016-12-14: 250 mL via INTRAVENOUS

## 2016-12-14 MED ORDER — POTASSIUM CHLORIDE ER 10 MEQ PO TBCR
10.0000 meq | EXTENDED_RELEASE_TABLET | Freq: Every day | ORAL | Status: DC
  Administered 2016-12-15 – 2016-12-19 (×5): 10 meq via ORAL
  Filled 2016-12-14 (×10): qty 1

## 2016-12-14 MED ORDER — IOPAMIDOL (ISOVUE-370) INJECTION 76%
INTRAVENOUS | Status: AC
  Filled 2016-12-14: qty 100

## 2016-12-14 MED ORDER — SODIUM CHLORIDE 0.9 % IV SOLN
INTRAVENOUS | Status: DC
  Administered 2016-12-14: 11:00:00 via INTRAVENOUS

## 2016-12-14 MED ORDER — FUROSEMIDE 20 MG PO TABS
20.0000 mg | ORAL_TABLET | Freq: Every day | ORAL | Status: DC
  Administered 2016-12-15 – 2016-12-19 (×6): 20 mg via ORAL
  Filled 2016-12-14 (×6): qty 1

## 2016-12-14 MED ORDER — ASPIRIN 81 MG PO CHEW
CHEWABLE_TABLET | ORAL | Status: AC
  Filled 2016-12-14: qty 1

## 2016-12-14 MED ORDER — SODIUM CHLORIDE 0.9% FLUSH
3.0000 mL | Freq: Two times a day (BID) | INTRAVENOUS | Status: DC
  Administered 2016-12-16 – 2016-12-18 (×5): 3 mL via INTRAVENOUS

## 2016-12-14 MED ORDER — FLUTICASONE PROPIONATE 50 MCG/ACT NA SUSP
1.0000 | Freq: Every day | NASAL | Status: DC
  Administered 2016-12-14 – 2016-12-18 (×4): 1 via NASAL
  Filled 2016-12-14: qty 16

## 2016-12-14 MED ORDER — HEPARIN (PORCINE) IN NACL 2-0.9 UNIT/ML-% IJ SOLN
INTRAMUSCULAR | Status: AC
  Filled 2016-12-14: qty 1000

## 2016-12-14 MED ORDER — VERAPAMIL HCL 2.5 MG/ML IV SOLN
INTRAVENOUS | Status: AC
  Filled 2016-12-14: qty 2

## 2016-12-14 MED ORDER — LORATADINE 10 MG PO TABS
10.0000 mg | ORAL_TABLET | Freq: Every day | ORAL | Status: DC
  Administered 2016-12-14 – 2016-12-19 (×6): 10 mg via ORAL
  Filled 2016-12-14 (×6): qty 1

## 2016-12-14 MED ORDER — LIDOCAINE HCL (PF) 1 % IJ SOLN
INTRAMUSCULAR | Status: AC
  Filled 2016-12-14: qty 30

## 2016-12-14 MED ORDER — SODIUM CHLORIDE 0.9% FLUSH: 3.0000 mL | INTRAVENOUS | Status: DC | PRN

## 2016-12-14 MED ORDER — ACETAMINOPHEN 325 MG PO TABS
650.0000 mg | ORAL_TABLET | ORAL | Status: DC | PRN
  Administered 2016-12-18: 650 mg via ORAL
  Filled 2016-12-14: qty 2

## 2016-12-14 MED ORDER — AMIODARONE HCL IN DEXTROSE 360-4.14 MG/200ML-% IV SOLN
30.0000 mg/h | INTRAVENOUS | Status: DC
  Administered 2016-12-14 – 2016-12-17 (×5): 30 mg/h via INTRAVENOUS
  Filled 2016-12-14 (×8): qty 200

## 2016-12-14 MED ORDER — ALBUTEROL SULFATE (2.5 MG/3ML) 0.083% IN NEBU
3.0000 mL | INHALATION_SOLUTION | Freq: Four times a day (QID) | RESPIRATORY_TRACT | Status: DC | PRN
  Administered 2016-12-14 – 2016-12-18 (×7): 3 mL via RESPIRATORY_TRACT
  Filled 2016-12-14 (×7): qty 3

## 2016-12-14 MED ORDER — DULOXETINE HCL 60 MG PO CPEP
60.0000 mg | ORAL_CAPSULE | Freq: Every evening | ORAL | Status: DC
  Administered 2016-12-14 – 2016-12-18 (×5): 60 mg via ORAL
  Filled 2016-12-14 (×5): qty 1

## 2016-12-14 MED ORDER — ASPIRIN 81 MG PO CHEW
81.0000 mg | CHEWABLE_TABLET | ORAL | Status: AC
  Administered 2016-12-14: 81 mg via ORAL

## 2016-12-14 MED ORDER — AMIODARONE HCL IN DEXTROSE 360-4.14 MG/200ML-% IV SOLN
60.0000 mg/h | INTRAVENOUS | Status: DC
  Administered 2016-12-14 (×2): 60 mg/h via INTRAVENOUS
  Filled 2016-12-14: qty 200

## 2016-12-14 MED ORDER — IOPAMIDOL (ISOVUE-370) INJECTION 76%
INTRAVENOUS | Status: DC | PRN
  Administered 2016-12-14: 115 mL via INTRA_ARTERIAL

## 2016-12-14 SURGICAL SUPPLY — 11 items
CATH 5FR JL3.5 JR4 ANG PIG MP (CATHETERS) ×3 IMPLANT
CATH BALLN WEDGE 5F 110CM (CATHETERS) ×3 IMPLANT
DEVICE RAD COMP TR BAND LRG (VASCULAR PRODUCTS) ×3 IMPLANT
GLIDESHEATH SLEND SS 6F .021 (SHEATH) ×3 IMPLANT
GUIDEWIRE INQWIRE 1.5J.035X260 (WIRE) ×1 IMPLANT
INQWIRE 1.5J .035X260CM (WIRE) ×3
KIT HEART LEFT (KITS) ×3 IMPLANT
PACK CARDIAC CATHETERIZATION (CUSTOM PROCEDURE TRAY) ×3 IMPLANT
SYR MEDRAD MARK V 150ML (SYRINGE) ×3 IMPLANT
TRANSDUCER W/STOPCOCK (MISCELLANEOUS) ×3 IMPLANT
TUBING CIL FLEX 10 FLL-RA (TUBING) ×3 IMPLANT

## 2016-12-14 NOTE — Interval H&P Note (Signed)
History and Physical Interval Note:  12/14/2016 12:41 PM  Kevin Oconnell  has presented today for surgery, with the diagnosis of hf  The various methods of treatment have been discussed with the patient and family. After consideration of risks, benefits and other options for treatment, the patient has consented to  Procedure(s): Right/Left Heart Cath and Coronary Angiography (N/A) as a surgical intervention .  The patient's history has been reviewed, patient examined, no change in status, stable for surgery.  I have reviewed the patient's chart and labs.  Questions were answered to the patient's satisfaction.     Kevin Oconnell 12/14/2016 12:42 PM Cath Lab Visit (complete for each Cath Lab visit)  Clinical Evaluation Leading to the Procedure:   ACS: No.  Non-ACS:    Anginal Classification: No Symptoms  Anti-ischemic medical therapy: Minimal Therapy (1 class of medications)  Non-Invasive Test Results: No non-invasive testing performed  Prior CABG: No previous CABG

## 2016-12-14 NOTE — Progress Notes (Signed)
Patient BP 78/51. Patient states feels good with no complaints. Spoke with Dr. [place] States to give a Bolus of Normal Saline. Patient receiving that at this time. Will continue to monitor patient.

## 2016-12-14 NOTE — Progress Notes (Signed)
Site area: rt ac brachial venous sheath Site Prior to Removal:  Level  0 Pressure Applied For:  10 minutes Manual:   yes Patient Status During Pull:  stable Post Pull Site:  Level  0 Post Pull Instructions Given:  yes Post Pull Pulses Present: yes Dressing Applied:  Gauze and tegaderm Bedrest begins @  Comments:

## 2016-12-14 NOTE — Progress Notes (Signed)
Pt states that he is SOB. PRN neb given. No acute respiratory distress noted.

## 2016-12-14 NOTE — Progress Notes (Signed)
BP is still soft and HR up to 120 so will repeat 250 ml NS then another bolus of amiodarone 150 mg over 20-30 min.  Parameters for BP and toprol written.

## 2016-12-14 NOTE — Progress Notes (Signed)
ANTICOAGULATION CONSULT NOTE - Initial Consult  Pharmacy Consult for heparin Indication: Afib, ACS/STEMI - begin 8 hr s/p sheath removal  No Known Allergies  Patient Measurements: Height: 6\' 4"  (193 cm) Weight: 257 lb 8 oz (116.8 kg) IBW/kg (Calculated) : 86.8 Heparin Dosing Weight: 111 kg  Vital Signs: Temp: 98.2 F (36.8 C) (03/01 1556) Temp Source: Oral (03/01 1556) BP: 98/79 (03/01 1556) Pulse Rate: 88 (03/01 1435)  Labs:  Recent Labs  12/14/16 1038  HGB 15.1  HCT 45.6  PLT 297  LABPROT 14.7  INR 1.15    Estimated Creatinine Clearance: 82.7 mL/min (by C-G formula based on SCr of 1.36 mg/dL (H)).   Medical History: Past Medical History:  Diagnosis Date  . Allergy   . Arthritis   . Asthma   . Heart murmur     Medications:  Prescriptions Prior to Admission  Medication Sig Dispense Refill Last Dose  . aspirin EC 81 MG tablet Take 81 mg by mouth every evening.   12/13/2016 at 2100  . Cholecalciferol (VITAMIN D) 2000 units tablet Take 2,000 Units by mouth daily.   Past Month at Unknown time  . DULoxetine (CYMBALTA) 60 MG capsule Take 1 capsule (60 mg total) by mouth daily. (Patient taking differently: Take 60 mg by mouth every evening. ) 90 capsule 3 12/13/2016 at 2100  . furosemide (LASIX) 20 MG tablet Take 1 tablet (20 mg total) by mouth daily. 30 tablet 3 12/14/2016 at 0630  . metoprolol succinate (TOPROL XL) 25 MG 24 hr tablet Take 1 tablet (25 mg total) by mouth daily. (Patient taking differently: Take 25 mg by mouth every evening. ) 30 tablet 3 12/13/2016 at 1930  . Multiple Vitamin (MULTIVITAMIN WITH MINERALS) TABS tablet Take 1 tablet by mouth daily.   Past Month at Unknown time  . Omega-3 Fatty Acids (FISH OIL) 1000 MG CAPS Take 1,000 mg by mouth.   Past Week at Unknown time  . potassium chloride (K-DUR) 10 MEQ tablet Take 1 tablet (10 mEq total) by mouth daily at 6 (six) AM. (Patient taking differently: Take 10 mEq by mouth daily. ) 30 tablet 3 12/14/2016 at  0630  . Spacer/Aero Chamber Mouthpiece MISC 1 each by Does not apply route every 6 (six) hours as needed. (Patient taking differently: 1 each by Does not apply route every 6 (six) hours as needed (for use with inhaler). ) 1 each 0 12/13/2016 at Unknown time  . tamsulosin (FLOMAX) 0.4 MG CAPS capsule Take 1 capsule (0.4 mg total) by mouth daily. (Patient taking differently: Take 0.4 mg by mouth every evening. ) 90 capsule 3 12/13/2016 at 2100  . vitamin C (ASCORBIC ACID) 500 MG tablet Take 500 mg by mouth daily.   Past Month at Unknown time  . albuterol (PROVENTIL HFA;VENTOLIN HFA) 108 (90 Base) MCG/ACT inhaler Inhale 2 puffs into the lungs every 6 (six) hours as needed for wheezing or shortness of breath. 1 Inhaler 2 More than a month at Unknown time    Assessment: 61 yo s/p cardiac cath.   Pharmacy consulted to dose heparin 8 hrs s/p sheath removal for ACS/STEMI and AFib.  Pt with new onset afib w/ RVR.  Wt 116.8 kg, HDW 111 kg.  INR 1.15 CBC WNL, Creat 1.36 Sheath removed 1406 with no hematoma noted.   Goal of Therapy:  Heparin level 0.3-0.7 units/ml Monitor platelets by anticoagulation protocol: Yes   Plan: Start heparin 8 hrs s/p sheath pull at 2200 with no bolus at rate  of 1500 units/hr and check heparin level in 7 hrs with 0500 am labs Daily HL/CBC while on heparin F/u transition to oral agent  Herby Abraham, Pharm.D. [phone] 12/14/2016 4:18 PM

## 2016-12-14 NOTE — H&P (View-Only) (Signed)
Clinical Summary Mr. Deschenes is a 61 y.o.male seen as a new patient, he is referred by Dr Ermalinda Memos.   1.PAF - admit to [facility] with new onset afib with RVR - converted back to NSR during admission. Discharged on oral dilt - never had palpitations. No symptoms since discharge - was not started on anticoag at time of hospitalization.    2. Acute systolic HF - echo 11/2016 at [facility] shows LVEF <20%, severe global hypokinesis, severe MR, mild TR,  - presented with several weeks of progressing SOB/DOE. No LE edema. No chest pain - compliant with lasix 20mg  daily. Home weights down 264 lbs.  - limiting sodium intake. Avoiding NSAIDs  - no EtoH. Mother had history CAD in her 61s, maternal uncle died 20 MI, maternal grandfather mid 53s. Normal TSH.  Past Medical History:  Diagnosis Date  . Allergy   . Arthritis   . Asthma   . Heart murmur      No Known Allergies   Current Outpatient Prescriptions  Medication Sig Dispense Refill  . albuterol (PROVENTIL HFA;VENTOLIN HFA) 108 (90 Base) MCG/ACT inhaler Inhale 2 puffs into the lungs every 6 (six) hours as needed for wheezing or shortness of breath. 1 Inhaler 2  . aspirin 81 MG tablet Take 81 mg by mouth daily. Reported on 12/16/2015    . diltiazem (CARDIZEM CD) 180 MG 24 hr capsule Take 1 capsule (180 mg total) by mouth daily. 30 capsule 3  . diltiazem (CARDIZEM) 30 MG tablet     . DULoxetine (CYMBALTA) 60 MG capsule Take 1 capsule (60 mg total) by mouth daily. 90 capsule 3  . furosemide (LASIX) 20 MG tablet Take 1 tablet (20 mg total) by mouth daily. 30 tablet 3  . potassium chloride (K-DUR) 10 MEQ tablet Take 1 tablet by mouth daily at 6 (six) AM.    . Spacer/Aero Chamber Mouthpiece MISC 1 each by Does not apply route every 6 (six) hours as needed. 1 each 0  . tamsulosin (FLOMAX) 0.4 MG CAPS capsule Take 1 capsule (0.4 mg total) by mouth daily. 90 capsule 3   No current facility-administered medications for this visit.       Past Surgical History:  Procedure Laterality Date  . SHOULDER SURGERY Right      No Known Allergies    Family History  Problem Relation Age of Onset  . Heart disease Mother   . Diabetes Mother   . Stroke Father      Social History Mr. Samp reports that he has quit smoking. He has never used smokeless tobacco. Mr. Palmisano reports that he does not drink alcohol.   Review of Systems CONSTITUTIONAL: No weight loss, fever, chills, weakness or fatigue.  HEENT: Eyes: No visual loss, blurred vision, double vision or yellow sclerae.No hearing loss, sneezing, congestion, runny nose or sore throat.  SKIN: No rash or itching.  CARDIOVASCULAR: per HPI RESPIRATORY: No shortness of breath, cough or sputum.  GASTROINTESTINAL: No anorexia, nausea, vomiting or diarrhea. No abdominal pain or blood.  GENITOURINARY: No burning on urination, no polyuria NEUROLOGICAL: No headache, dizziness, syncope, paralysis, ataxia, numbness or tingling in the extremities. No change in bowel or bladder control.  MUSCULOSKELETAL: No muscle, back pain, joint pain or stiffness.  LYMPHATICS: No enlarged nodes. No history of splenectomy.  PSYCHIATRIC: No history of depression or anxiety.  ENDOCRINOLOGIC: No reports of sweating, cold or heat intolerance. No polyuria or polydipsia.  Marland Kitchen   Physical Examination Vitals:   12/11/16  1338  BP: (!) 91/56  Pulse: 62   Vitals:   12/11/16 1338  Weight: 266 lb (120.7 kg)  Height: 6\' 4"  (1.93 m)    Gen: resting comfortably, no acute distress HEENT: no scleral icterus, pupils equal round and reactive, no palptable cervical adenopathy,  CV: RRR, 2/6 systolic murmur at apex, no jvd Resp: Clear to auscultation bilaterally GI: abdomen is soft, non-tender, non-distended, normal bowel sounds, no hepatosplenomegaly MSK: extremities are warm, no edema.  Skin: warm, no rash Neuro:  no focal deficits Psych: appropriate affect      Assessment and Plan  1.  Acute systolic heart failure - new diagnosis, at time of admission [facility] echo results were not available. This is the first he has heard of the diagnosis today.   - echo from [facility] with LVEF <20%. I suspect the reported severe MR is likely function. Will need to obtain films - medical therapy limited by soft bp's. In setting of systolic dysfunction stop cardizem. Start Toprol XL 25mg  daily. No other new meds at this time due to soft bp's - we will refer for LHC/RHC to evaluate for possible ICM and filling pressures. Quite possibly tachycardia mediated CM, if ICM excluded will need to follow function with better control of his afib.   2. PAF - new diagnosis during recent admission. No palpitations, unclear duration.  - in setting of systolic dysfunction, stop dilt. Start Toprol XL 25mg  daily - ekg in clinic today shows he is back in afib today with elevated rates. How that change to Toprol will control. With soft bp's limited uptitration available of av nodal agents. Could consider digoxin. May need referral to EP if remains uncontrolled to consider antiarrhythmic therapy, especially since possible tachycardia mediated CM - CHADS2Vasc score is 1 (CHF) based on available data. We will discuss possible anticoagulation vs ASA after his cath.    I have reviewed the risks, indications, and alternatives to cardiac catheterization, possible angioplasty, and stenting with the patient. Risks include but are not limited to bleeding, infection, vascular injury, stroke, myocardial infection, arrhythmia, kidney injury, radiation-related injury in the case of prolonged fluoroscopy use, emergency cardiac surgery, and death. The patient understands the risks of serious complication is 1-2 in 1000 with diagnostic cardiac cath and 1-2% or less with angioplasty/stenting.    [name], M.D

## 2016-12-15 ENCOUNTER — Encounter (HOSPITAL_COMMUNITY): Payer: Self-pay | Admitting: *Deleted

## 2016-12-15 ENCOUNTER — Inpatient Hospital Stay (HOSPITAL_COMMUNITY)

## 2016-12-15 DIAGNOSIS — I2510 Atherosclerotic heart disease of native coronary artery without angina pectoris: Secondary | ICD-10-CM

## 2016-12-15 DIAGNOSIS — Z7901 Long term (current) use of anticoagulants: Secondary | ICD-10-CM

## 2016-12-15 DIAGNOSIS — I4819 Other persistent atrial fibrillation: Secondary | ICD-10-CM

## 2016-12-15 DIAGNOSIS — I481 Persistent atrial fibrillation: Secondary | ICD-10-CM

## 2016-12-15 DIAGNOSIS — I420 Dilated cardiomyopathy: Secondary | ICD-10-CM

## 2016-12-15 LAB — BASIC METABOLIC PANEL
ANION GAP: 11 (ref 5–15)
BUN: 18 mg/dL (ref 6–20)
CHLORIDE: 107 mmol/L (ref 101–111)
CO2: 20 mmol/L — ABNORMAL LOW (ref 22–32)
Calcium: 9 mg/dL (ref 8.9–10.3)
Creatinine, Ser: 1.02 mg/dL (ref 0.61–1.24)
GFR calc Af Amer: >60 mL/min (ref >60)
GFR calc non Af Amer: >60 mL/min (ref >60)
Glucose, Bld: 129 mg/dL — ABNORMAL HIGH (ref 65–99)
POTASSIUM: 4 mmol/L (ref 3.5–5.1)
SODIUM: 138 mmol/L (ref 135–145)

## 2016-12-15 LAB — CBC
HEMATOCRIT: 41.3 % (ref 39.0–52.0)
HEMOGLOBIN: 13.4 g/dL (ref 13.0–17.0)
MCH: 28.6 pg (ref 26.0–34.0)
MCHC: 32.4 g/dL (ref 30.0–36.0)
MCV: 88.1 fL (ref 78.0–100.0)
Platelets: 242 10*3/uL (ref 150–400)
RBC: 4.69 MIL/uL (ref 4.22–5.81)
RDW: 14.2 % (ref 11.5–15.5)
WBC: 5.3 10*3/uL (ref 4.0–10.5)

## 2016-12-15 LAB — TSH: TSH: 2.243 u[IU]/mL (ref 0.350–4.500)

## 2016-12-15 LAB — HEPARIN LEVEL (UNFRACTIONATED): HEPARIN UNFRACTIONATED: 0.18 [IU]/mL — AB (ref 0.30–0.70)

## 2016-12-15 LAB — HIV ANTIBODY (ROUTINE TESTING W REFLEX): HIV Screen 4th Generation wRfx: NONREACTIVE

## 2016-12-15 LAB — BRAIN NATRIURETIC PEPTIDE: B NATRIURETIC PEPTIDE 5: 208.7 pg/mL — AB (ref 0.0–100.0)

## 2016-12-15 MED ORDER — APIXABAN 5 MG PO TABS: 5.0000 mg | ORAL_TABLET | Freq: Two times a day (BID) | ORAL | Status: DC

## 2016-12-15 MED ORDER — DIGOXIN 0.25 MG/ML IJ SOLN
0.2500 mg | Freq: Once | INTRAMUSCULAR | Status: AC
  Administered 2016-12-15: 0.25 mg via INTRAVENOUS
  Filled 2016-12-15: qty 2

## 2016-12-15 MED ORDER — APIXABAN 5 MG PO TABS
5.0000 mg | ORAL_TABLET | Freq: Two times a day (BID) | ORAL | Status: DC
  Administered 2016-12-15 – 2016-12-19 (×9): 5 mg via ORAL
  Filled 2016-12-15 (×9): qty 1

## 2016-12-15 MED ORDER — ZOLPIDEM TARTRATE 5 MG PO TABS
10.0000 mg | ORAL_TABLET | Freq: Every evening | ORAL | Status: DC | PRN
  Administered 2016-12-15 – 2016-12-18 (×4): 10 mg via ORAL
  Filled 2016-12-15 (×4): qty 2

## 2016-12-15 MED ORDER — METOPROLOL TARTRATE 25 MG PO TABS
25.0000 mg | ORAL_TABLET | Freq: Two times a day (BID) | ORAL | Status: DC
  Administered 2016-12-15: 25 mg via ORAL
  Filled 2016-12-15: qty 1

## 2016-12-15 NOTE — Discharge Instructions (Signed)

## 2016-12-15 NOTE — Progress Notes (Signed)
ANTICOAGULATION CONSULT NOTE   Pharmacy Consult for heparin Indication: Afib  No Known Allergies  Patient Measurements: Height: 6\' 4"  (193 cm) Weight: 257 lb 8 oz (116.8 kg) IBW/kg (Calculated) : 86.8 Heparin Dosing Weight: 111 kg  Vital Signs: Temp: 97.9 F (36.6 C) (03/01 2306) Temp Source: Oral (03/01 2306) BP: 87/67 (03/01 2359) Pulse Rate: 92 (03/01 2359)  Labs:  Recent Labs  12/14/16 1038 12/15/16 0409  HGB 15.1 13.4  HCT 45.6 41.3  PLT 297 242  LABPROT 14.7  --   INR 1.15  --   HEPARINUNFRC  --  0.18*  CREATININE  --  1.02    Estimated Creatinine Clearance: 110.3 mL/min (by C-G formula based on SCr of 1.02 mg/dL).  Assessment: 61 y.o. male with Afib s/p cath yesterday, awaiting possible PCI, for heparin  Goal of Therapy:  Heparin level 0.3-0.7 units/ml Monitor platelets by anticoagulation protocol: Yes   Plan: Increase Heparin 1800 units/hr Check heparin level in 8 hours.    Geannie Risen, PharmD, BCPS 12/15/2016 5:08 AM

## 2016-12-15 NOTE — Progress Notes (Addendum)
Progress Note  Patient Name: Kevin Oconnell Date of Encounter: 12/15/2016  Primary Cardiologist: Dina Rich, M.D.  Primary care: Dr. Ermalinda Memos, [place]  Subjective   Slightly dyspneic. No chest discomfort. Feels better than previous.  Inpatient Medications    Scheduled Meds: . aspirin EC  81 mg Oral QPM  . atorvastatin  80 mg Oral q1800  . cholecalciferol  2,000 Units Oral Daily  . DULoxetine  60 mg Oral QPM  . fluticasone  1 spray Each Nare Daily  . furosemide  20 mg Oral Daily  . loratadine  10 mg Oral Daily  . metoprolol tartrate  25 mg Oral BID  . multivitamin with minerals  1 tablet Oral Daily  . omega-3 acid ethyl esters  2 g Oral Daily  . potassium chloride  10 mEq Oral Q0600  . sodium chloride flush  3 mL Intravenous Q12H  . tamsulosin  0.4 mg Oral Daily   Continuous Infusions: . amiodarone 30 mg/hr (12/14/16 2232)  . heparin 1,800 Units/hr (12/15/16 0532)   PRN Meds: sodium chloride, acetaminophen, albuterol, ondansetron (ZOFRAN) IV, sodium chloride flush, zolpidem   Vital Signs    Vitals:   12/14/16 2359 12/15/16 0459 12/15/16 0539 12/15/16 0831  BP: (!) 87/67 92/81  (!) 81/64  Pulse: 92 63  (!) 110  Resp: (!) 0 15    Temp:  97.6 F (36.4 C)    TempSrc:  Oral    SpO2: 95% 93%    Weight:   258 lb 8 oz (117.3 kg)   Height:        Intake/Output Summary (Last 24 hours) at 12/15/16 1053 Last data filed at 12/15/16 0700  Gross per 24 hour  Intake          2095.98 ml  Output              525 ml  Net          1570.98 ml   Filed Weights   12/14/16 1034 12/14/16 1556 12/15/16 0539  Weight: 259 lb (117.5 kg) 257 lb 8 oz (116.8 kg) 258 lb 8 oz (117.3 kg)    Telemetry    Atrial fibrillation with rapid ventricular response - Personally Reviewed  ECG    Atrial fib with moderate rate control on 12/15/16. No evidence of prior infarction.. - Personally Reviewed  Physical Exam  Moderate obesity. GEN: No acute distress.   Neck: No JVD Cardiac:  IIRR, no murmurs, rubs, or gallops.  Respiratory: Clear to auscultation bilaterally. GI: Soft, nontender, non-distended  MS: No edema; No deformity. Neuro:  Nonfocal  Psych: Normal affect   Labs    Chemistry Recent Labs Lab 12/08/16 1211 12/15/16 0409  NA 143 138  K 4.9 4.0  CL 100 107  CO2 24 20*  GLUCOSE 97 129*  BUN 21 18  CREATININE 1.36* 1.02  CALCIUM 9.6 9.0  GFRNONAA 57* >60  GFRAA 66 >60  ANIONGAP  --  11     Hematology Recent Labs Lab 12/14/16 1038 12/15/16 0409  WBC 5.9 5.3  RBC 5.18 4.69  HGB 15.1 13.4  HCT 45.6 41.3  MCV 88.0 88.1  MCH 29.2 28.6  MCHC 33.1 32.4  RDW 13.9 14.2  PLT 297 242    Cardiac EnzymesNo results for input(s): TROPONINI in the last 168 hours. No results for input(s): TROPIPOC in the last 168 hours.   BNP Recent Labs Lab 12/08/16 1211  BNP 265.6*     DDimer No results for input(s): DDIMER  in the last 168 hours.   Radiology    No results found.  Cardiac Studies   Cardiac catheterization performed 12/14/16: Diagnostic Diagram      LVEF by cath 15%. Echo pending   Patient Profile     61 y.o. male with strong family history of coronary artery disease, presented with a one-month history of "asthma" and found to have severe reduction in LV systolic function EF 15%, congestive heart failure, and significant coronary artery disease by angiography 12/14/16.  Assessment & Plan    1. Severe left ventricular systolic dysfunction, EF 15%, with volume overload and acute on chronic systolic heart failure. Plan diuresis as tolerated by blood pressure. PA and Lat CXR today. Echo done at [facility] 2. 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 3. Atrial fibrillation with rapid ventricular response, duration is unknown. Plan is to address atrial fibrillation first. Continue loading with amiodarone through the weekend. Hopefully rate will come under better control.  Plan TEE cardioversion early next week. Rate control over weekend as we load amiodarone. Switch to Eliquis and DC IV heparin. Digoxin 0.25 mg 1 IV today. 4. Severe two-vessel coronary artery disease, plan at this time is to control rhythm with amiodarone and cardioversion, followed by guideline mandated therapy of systolic dysfunction. Coronary artery disease will be addressed in weeks to months after acute heart failure and atrial fibrillation are managed. Optimally, LV function should be reassessed after 90 days of appropriate heart failure therapy and prior to complex high risk coronary PCI (CHIP) or CABG.  Signed, Lesleigh Noe, MD  12/15/2016, 10:53 AM

## 2016-12-16 ENCOUNTER — Encounter (HOSPITAL_COMMUNITY): Payer: Self-pay | Admitting: *Deleted

## 2016-12-16 LAB — BASIC METABOLIC PANEL
ANION GAP: 8 (ref 5–15)
BUN: 18 mg/dL (ref 6–20)
CALCIUM: 8.9 mg/dL (ref 8.9–10.3)
CO2: 25 mmol/L (ref 22–32)
Chloride: 107 mmol/L (ref 101–111)
Creatinine, Ser: 1.18 mg/dL (ref 0.61–1.24)
Glucose, Bld: 120 mg/dL — ABNORMAL HIGH (ref 65–99)
POTASSIUM: 3.6 mmol/L (ref 3.5–5.1)
Sodium: 140 mmol/L (ref 135–145)

## 2016-12-16 MED ORDER — METOPROLOL SUCCINATE ER 25 MG PO TB24
25.0000 mg | ORAL_TABLET | Freq: Three times a day (TID) | ORAL | Status: DC
  Administered 2016-12-16 – 2016-12-19 (×10): 25 mg via ORAL
  Filled 2016-12-16 (×10): qty 1

## 2016-12-16 MED ORDER — DIGOXIN 0.25 MG/ML IJ SOLN
0.2500 mg | Freq: Four times a day (QID) | INTRAMUSCULAR | Status: AC
  Administered 2016-12-16 (×2): 0.25 mg via INTRAVENOUS
  Filled 2016-12-16 (×2): qty 2

## 2016-12-16 NOTE — Progress Notes (Addendum)
Progress Note  Patient Name: Kevin Oconnell Date of Encounter: 12/16/2016  Primary Cardiologist: Dina Rich, M.D.  Primary care: Dr. Ermalinda Memos, [place]  Subjective   He is feeling better at the present time with less shortness of breath.  No chest pain  Inpatient Medications    Scheduled Meds: . apixaban  5 mg Oral BID  . aspirin EC  81 mg Oral QPM  . atorvastatin  80 mg Oral q1800  . cholecalciferol  2,000 Units Oral Daily  . DULoxetine  60 mg Oral QPM  . fluticasone  1 spray Each Nare Daily  . furosemide  20 mg Oral Daily  . loratadine  10 mg Oral Daily  . metoprolol tartrate  25 mg Oral BID  . multivitamin with minerals  1 tablet Oral Daily  . omega-3 acid ethyl esters  2 g Oral Daily  . potassium chloride  10 mEq Oral Q0600  . sodium chloride flush  3 mL Intravenous Q12H  . tamsulosin  0.4 mg Oral Daily   Continuous Infusions: . amiodarone 30 mg/hr (12/15/16 2216)   PRN Meds: sodium chloride, acetaminophen, albuterol, ondansetron (ZOFRAN) IV, sodium chloride flush, zolpidem   Vital Signs    Vitals:   12/15/16 2213 12/15/16 2220 12/16/16 0527 12/16/16 0621  BP: 100/60 100/68  92/64  Pulse: (!) 48 (!) 120  86  Resp: (!) 22 (!) 23  15  Temp:   98 F (36.7 C)   TempSrc:   Oral   SpO2: 95% 96%  97%  Weight:   116.6 kg (257 lb 1.6 oz)   Height:        Intake/Output Summary (Last 24 hours) at 12/16/16 0821 Last data filed at 12/16/16 0655  Gross per 24 hour  Intake           639.69 ml  Output             1700 ml  Net         -1060.31 ml   Filed Weights   12/14/16 1556 12/15/16 0539 12/16/16 0527  Weight: 116.8 kg (257 lb 8 oz) 117.3 kg (258 lb 8 oz) 116.6 kg (257 lb 1.6 oz)    Telemetry    Atrial fibrillation with Increased ventricular response - Personally Reviewed   Physical Exam  Moderate obesity. GEN: Obese male lying in bed in no acute distress  Neck: No JVD Cardiac: IIRR, no murmurs, rubs, or gallops.  Respiratory: Clear to  auscultation bilaterally. MS: No edema; No deformity. Neuro:  Nonfocal  Psych: Normal affect   Labs    Chemistry  Recent Labs Lab 12/15/16 0409 12/16/16 0444  NA 138 140  K 4.0 3.6  CL 107 107  CO2 20* 25  GLUCOSE 129* 120*  BUN 18 18  CREATININE 1.02 1.18  CALCIUM 9.0 8.9  GFRNONAA >60 >60  GFRAA >60 >60  ANIONGAP 11 8     Hematology  Recent Labs Lab 12/14/16 1038 12/15/16 0409  WBC 5.9 5.3  RBC 5.18 4.69  HGB 15.1 13.4  HCT 45.6 41.3  MCV 88.0 88.1  MCH 29.2 28.6  MCHC 33.1 32.4  RDW 13.9 14.2  PLT 297 242   BNP  Recent Labs Lab 12/15/16 1120  BNP 208.7*    Cardiac Studies   Cardiac catheterization performed 12/14/16: Diagnostic Diagram      LVEF by cath 15%. Echo pending   Patient Profile     61 y.o. male with strong family history of coronary artery disease,  presented with a one-month history of "asthma" and found to have severe reduction in LV systolic function EF 15%, congestive heart failure, and significant coronary artery disease by angiography 12/14/16.  Assessment & Plan    1. Severe left ventricular systolic dysfunction, EF 15%, with volume overload and acute on chronic systolic heart failure. Continue diuresis, weights are essentially unchanged by scale  2. 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 3. Atrial fibrillation with rapid ventricular response, -he has been changed to Eliquis.  We'll continue IV amiodarone loading over the weekend and planned cardioversion on Monday, additional digoxin to help with rate control and try to titrate beta blockers-He is currently on metoprolol tartrate and because of heart failure will change to metoprolol succinate and increase to 3 times daily and watch blood pressure. 4. Severe two-vessel coronary artery disease, plan at this time is to control rhythm with amiodarone and cardioversion, followed by guideline mandated therapy of systolic  dysfunction. Coronary artery disease to be addressed later   Signed, Georga Hacking, MD  12/16/2016, 8:21 AM

## 2016-12-17 LAB — BASIC METABOLIC PANEL
ANION GAP: 7 (ref 5–15)
BUN: 18 mg/dL (ref 6–20)
CALCIUM: 8.9 mg/dL (ref 8.9–10.3)
CO2: 25 mmol/L (ref 22–32)
CREATININE: 1.17 mg/dL (ref 0.61–1.24)
Chloride: 109 mmol/L (ref 101–111)
GFR calc non Af Amer: >60 mL/min (ref >60)
Glucose, Bld: 119 mg/dL — ABNORMAL HIGH (ref 65–99)
Potassium: 3.7 mmol/L (ref 3.5–5.1)
SODIUM: 141 mmol/L (ref 135–145)

## 2016-12-17 MED ORDER — AMIODARONE HCL 200 MG PO TABS
400.0000 mg | ORAL_TABLET | Freq: Two times a day (BID) | ORAL | Status: DC
  Administered 2016-12-17 – 2016-12-19 (×4): 400 mg via ORAL
  Filled 2016-12-17 (×4): qty 2

## 2016-12-17 NOTE — Progress Notes (Signed)
Progress Note  Patient Name: Kevin Oconnell Date of Encounter: 12/17/2016  Primary Cardiologist: Dina Rich, M.D.  Primary care: Dr. Ermalinda Memos, [place]  Subjective   Inverted in normal sinus rhythm overnight.  He is still having some bursts of rhythm either VT or rapid atrial fibrillation.  Says he feels somewhat better.  Inpatient Medications    Scheduled Meds: . apixaban  5 mg Oral BID  . aspirin EC  81 mg Oral QPM  . atorvastatin  80 mg Oral q1800  . cholecalciferol  2,000 Units Oral Daily  . DULoxetine  60 mg Oral QPM  . fluticasone  1 spray Each Nare Daily  . furosemide  20 mg Oral Daily  . loratadine  10 mg Oral Daily  . metoprolol succinate  25 mg Oral TID PC  . multivitamin with minerals  1 tablet Oral Daily  . omega-3 acid ethyl esters  2 g Oral Daily  . potassium chloride  10 mEq Oral Q0600  . sodium chloride flush  3 mL Intravenous Q12H  . tamsulosin  0.4 mg Oral Daily   Continuous Infusions: . amiodarone 30 mg/hr (12/17/16 1136)   PRN Meds: sodium chloride, acetaminophen, albuterol, ondansetron (ZOFRAN) IV, sodium chloride flush, zolpidem   Vital Signs    Vitals:   12/17/16 0700 12/17/16 0800 12/17/16 0900 12/17/16 1000  BP:      Pulse: 85 (!) 102 97 94  Resp: 12 (!) 21 (!) 29 (!) 22  Temp:      TempSrc:      SpO2: 98% 93% 97% 94%  Weight:      Height:        Intake/Output Summary (Last 24 hours) at 12/17/16 1143 Last data filed at 12/17/16 1000  Gross per 24 hour  Intake           1005.1 ml  Output             2380 ml  Net          -1374.9 ml   Filed Weights   12/15/16 0539 12/16/16 0527 12/17/16 0410  Weight: 117.3 kg (258 lb 8 oz) 116.6 kg (257 lb 1.6 oz) 117.4 kg (258 lb 14.4 oz)    Telemetry    Sinus rhythm, some nonsustained atrial fibrillation bursts are noted with rapid response.   Physical Exam  Moderate obesity. GEN: Obese male lying in bed in no acute distress  Neck: No JVD Cardiac: Regular rhythm, no murmurs or  gallop, normal S1 and S2  Respiratory: Clear to auscultation bilaterally. MS: No edema; No deformity. Neuro:  Nonfocal  Psych: Normal affect   Labs    Chemistry  Recent Labs Lab 12/15/16 0409 12/16/16 0444 12/17/16 0322  NA 138 140 141  K 4.0 3.6 3.7  CL 107 107 109  CO2 20* 25 25  GLUCOSE 129* 120* 119*  BUN 18 18 18   CREATININE 1.02 1.18 1.17  CALCIUM 9.0 8.9 8.9  GFRNONAA >60 >60 >60  GFRAA >60 >60 >60  ANIONGAP 11 8 7      Hematology  Recent Labs Lab 12/14/16 1038 12/15/16 0409  WBC 5.9 5.3  RBC 5.18 4.69  HGB 15.1 13.4  HCT 45.6 41.3  MCV 88.0 88.1  MCH 29.2 28.6  MCHC 33.1 32.4  RDW 13.9 14.2  PLT 297 242   BNP  Recent Labs Lab 12/15/16 1120  BNP 208.7*    Cardiac Studies   Cardiac catheterization performed 12/14/16: Diagnostic Diagram      LVEF  by cath 15%. Echo pending   Patient Profile     61 y.o. male with strong family history of coronary artery disease, presented with a one-month history of "asthma" and found to have severe reduction in LV systolic function EF 15%, congestive heart failure, and significant coronary artery disease by angiography 12/14/16.  Assessment & Plan    1. Severe left ventricular systolic dysfunction, EF 15%, with volume overload and acute on chronic systolic heart failure. Continue diuresis, weights are essentially unchanged by scale  2. 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 3. Atrial fibrillation with rapid ventricular response, -He has converted to sinus rhythm but is still having some bursts of atrial fibrillation.  I'm going to continue amiodarone intravenously today and then if stable overnight convert to oral therapy in the morning. 4. Severe two-vessel coronary artery disease, plan at this time is to control rhythm with amiodarone and cardioversion, followed by guideline mandated therapy of systolic dysfunction. Coronary artery disease to be  addressed later   Signed, Georga Hacking, MD  12/17/2016, 11:43 AM

## 2016-12-17 NOTE — Progress Notes (Signed)
Patient doing well, NSR since approximately 0830. Vitals remain stable. Started on PO Amiodarone. Family present throughout the day. No questions or concerns at this time. Patient comfortable, call bell in reach.

## 2016-12-17 NOTE — Progress Notes (Signed)
Patient IV no longer functioning, have put an IV team consult in. Paged on-call physician Dr. Swaziland with cardiology and he has given a verbal order for IV amiodarone to be changed to PO. Order has been put in and waiting on pharmacy verification.

## 2016-12-18 ENCOUNTER — Encounter (HOSPITAL_COMMUNITY)
Admission: AD | Disposition: A | Discharge status: Home / Self Care | Payer: Self-pay | Source: Ambulatory Visit | Attending: Cardiology

## 2016-12-18 ENCOUNTER — Telehealth: Payer: Self-pay | Admitting: Cardiology

## 2016-12-18 ENCOUNTER — Telehealth: Payer: Self-pay | Admitting: Family Medicine

## 2016-12-18 DIAGNOSIS — I4891 Unspecified atrial fibrillation: Secondary | ICD-10-CM

## 2016-12-18 LAB — BASIC METABOLIC PANEL
ANION GAP: 6 (ref 5–15)
BUN: 19 mg/dL (ref 6–20)
CALCIUM: 9 mg/dL (ref 8.9–10.3)
CO2: 27 mmol/L (ref 22–32)
CREATININE: 1.26 mg/dL — AB (ref 0.61–1.24)
Chloride: 106 mmol/L (ref 101–111)
Glucose, Bld: 110 mg/dL — ABNORMAL HIGH (ref 65–99)
Potassium: 3.9 mmol/L (ref 3.5–5.1)
SODIUM: 139 mmol/L (ref 135–145)

## 2016-12-18 SURGERY — ECHOCARDIOGRAM, TRANSESOPHAGEAL
Anesthesia: Moderate Sedation

## 2016-12-18 MED ORDER — FUROSEMIDE 10 MG/ML IJ SOLN
20.0000 mg | Freq: Two times a day (BID) | INTRAMUSCULAR | Status: AC
  Administered 2016-12-18 – 2016-12-19 (×2): 20 mg via INTRAVENOUS
  Filled 2016-12-18 (×2): qty 2

## 2016-12-18 NOTE — Progress Notes (Signed)
    Subjective:  No chest pain. The patient still short of breath with activity. He has not had orthopnea the last few days.  Objective:  Vital Signs in the last 24 hours: Temp:  [97.9 F (36.6 C)-99 F (37.2 C)] 98.1 F (36.7 C) (03/05 0736) Pulse Rate:  [69-95] 69 (03/05 0736) Resp:  [12-31] 15 (03/05 0736) BP: (106-136)/(76-89) 136/76 (03/05 0736) SpO2:  [91 %-98 %] 98 % (03/05 0736) Weight:  [258 lb 12.8 oz (117.4 kg)] 258 lb 12.8 oz (117.4 kg) (03/05 0500)  Intake/Output from previous day: 03/04 0701 - 03/05 0700 In: 990.3 [P.O.:840; I.V.:150.3] Out: 1080 [Urine:1080]  Physical Exam: Pt is alert and oriented, NAD HEENT: normal Neck: JVP - moderately elevated Lungs: CTA bilaterally CV: RRR without murmur or gallop Abd: soft, NT, Positive BS, no hepatomegaly Ext: no C/C/E Skin: warm/dry no rash   Lab Results: No results for input(s): WBC, HGB, PLT in the last 72 hours.  Recent Labs  12/17/16 0322 12/18/16 0254  NA 141 139  K 3.7 3.9  CL 109 106  CO2 25 27  GLUCOSE 119* 110*  BUN 18 19  CREATININE 1.17 1.26*   No results for input(s): TROPONINI in the last 72 hours.  Invalid input(s): CK, MB  Tele: Normal sinus rhythm, personally reviewed, heart rate approximately 95 bpm  Assessment/Plan:  1. Acute systolic heart failure. The patient has severe LV dysfunction with LVEF 15%. Hopefully a significant degree of his LV dysfunction is related to atrial fibrillation with RVR. He does have significant coronary obstructive disease and I suspect his LV dysfunction is multifactorial. The patient is still volume overloaded. He is tolerating a low dose of metoprolol succinate. We'll give him IV Lasix this morning for further diuresis. His blood pressure is borderline and I do not think he will tolerate an ACE or ARB. We will try to discharge him tomorrow depending on his clinical course. We had a lengthy discussion about his prognosis, management options, and both short  and long-term plan with respect to his coronary artery disease and congestive heart failure. His daughter is present at the bedside for this discussion.  2. Atrial fibrillation with RVR: Now in sinus rhythm on amiodarone. Tolerating Royetta Crochet for anticoagulation. Continue oral amiodarone load over a proximally 2 weeks, then will reduce his outpatient dose.  3. Severe 2 vessel coronary artery disease: I reviewed the patient's cardiac catheterization films. He has chronic occlusion of the left circumflex with some angiographic features favorable for CTO-PCI. He also has moderate to severe LAD stenosis. It would be appropriate to revascularize after he has stabilized with respect his atrial fibrillation and congestive heart failure.  Disposition: IV diuresis today, possible discharge tomorrow, repeat echocardiogram in 3 months to assess LV function, consider PCI electively after reassessment of LV function. He will need close outpatient follow-up for his heart failure. He will need to be out of work for the next 3 months.   Tonny Bollman, M.D. 12/18/2016, 9:55 AM

## 2016-12-18 NOTE — Telephone Encounter (Signed)
Please advise 

## 2016-12-18 NOTE — Telephone Encounter (Signed)
Detailed message left on voice mail.  Looks like patient is still currently admitted.  Explained that the discharging docs at [facility] will need to give him the work note as they will know length of time he is to remain out.

## 2016-12-18 NOTE — Telephone Encounter (Signed)
Spoke to pt's wife and she states her husband was sent to the cath lab by Dr. Wyline Mood (cardiologist) and then admitted to the hospital. Advised pt's wife she needs to get a work note from the Cardiologist since he is the one treating him for Cardiac issues and that's why he is in the hospital. Pt's wife voiced understanding.

## 2016-12-18 NOTE — Telephone Encounter (Signed)
Pt is currently admitted, I am ok with a note for work. He will likely need FMLA paperwork as well.   Murtis Sink, MD [facility] 12/18/2016, 9:54 AM

## 2016-12-18 NOTE — Telephone Encounter (Signed)
lmtcb

## 2016-12-18 NOTE — Telephone Encounter (Signed)
Kevin Oconnell called stating that patient Kevin Oconnell) needs a note faxed to his work Regarding him being out of work.  Fax # [phone] attention: Mrs. Laurell Josephs

## 2016-12-18 NOTE — Care Management Note (Addendum)
Case Management Note  Patient Details  Name: Kevin Oconnell MRN: [id number] Date of Birth: [birth year]  Subjective/Objective:   Pt presented for Acute Systolic Heart Failure. Pt plan for home once stable. Benefits check completed for Eliquis.                 Action/Plan: S/W STEVE @ [employer] # [phone]   ELIQUIS  5 MG BID   COVER- YES  CO-PAY- $ 28.00  PRIOR APPROVAL- NO   PHARMACY: [facility]   MAIL-ORDER FOR 90 DAY SUPPLY $ 24.00  Expected Discharge Date:                  Expected Discharge Plan:  Home/Self Care  In-House Referral:  NA  Discharge planning Services  CM Consult, Medication Assistance  Post Acute Care Choice:  NA Choice offered to:  NA  DME Arranged:  N/A DME Agency:  NA  HH Arranged:  NA HH Agency:  NA  Status of Service:  Completed, signed off  If discussed at Long Length of Stay Meetings, dates discussed:    Additional Comments: 1057 12-19-16 Tomi Bamberger, RN,BSN [phone] CM to provide pt with 30 day free Eliquis Card. Pt uses [facility], clinical and medication is available. Pt states if the cost gets too expensive he will go to [facility] for assistance with Eliquis.No further needs at this time.  Gala Lewandowsky, RN 12/18/2016, 4:25 PM

## 2016-12-19 DIAGNOSIS — I255 Ischemic cardiomyopathy: Principal | ICD-10-CM

## 2016-12-19 MED ORDER — APIXABAN 5 MG PO TABS: 5.0000 mg | ORAL_TABLET | Freq: Two times a day (BID) | ORAL | 0 refills | Status: DC

## 2016-12-19 MED ORDER — ATORVASTATIN CALCIUM 80 MG PO TABS: 80.0000 mg | ORAL_TABLET | Freq: Every day | ORAL | 11 refills | Status: DC

## 2016-12-19 MED ORDER — METOPROLOL SUCCINATE ER 25 MG PO TB24: 25.0000 mg | ORAL_TABLET | Freq: Two times a day (BID) | ORAL | Status: DC

## 2016-12-19 MED ORDER — METOPROLOL SUCCINATE ER 25 MG PO TB24: 25.0000 mg | ORAL_TABLET | Freq: Two times a day (BID) | ORAL | 3 refills | Status: DC

## 2016-12-19 MED ORDER — APIXABAN 5 MG PO TABS: 5.0000 mg | ORAL_TABLET | Freq: Two times a day (BID) | ORAL | 11 refills | Status: DC

## 2016-12-19 MED ORDER — AMIODARONE HCL 200 MG PO TABS: 400.0000 mg | ORAL_TABLET | Freq: Two times a day (BID) | ORAL | 11 refills | Status: DC

## 2016-12-19 MED ORDER — ZOLPIDEM TARTRATE 10 MG PO TABS: 10.0000 mg | ORAL_TABLET | Freq: Every evening | ORAL | 0 refills | Status: DC | PRN

## 2016-12-19 NOTE — Progress Notes (Signed)
SUBJECTIVE: No chest pain or dyspnea  Tele: sinus  BP 91/73 (BP Location: Left Arm)   Pulse 71   Temp 97.7 F (36.5 C) (Oral)   Resp 11   Ht 6\' 4"  (1.93 m)   Wt 255 lb 4.8 oz (115.8 kg)   SpO2 96%   BMI 31.08 kg/m   Intake/Output Summary (Last 24 hours) at 12/19/16 1042 Last data filed at 12/19/16 0930  Gross per 24 hour  Intake              720 ml  Output              925 ml  Net             -205 ml    PHYSICAL EXAM General: Well developed, well nourished, in no acute distress. Alert and oriented x 3.  Psych:  Good affect, responds appropriately Neck: No JVD. No masses noted.  Lungs: Clear bilaterally with no wheezes or rhonci noted.  Heart: RRR with no murmurs noted. Abdomen: Bowel sounds are present. Soft, non-tender.  Extremities: No lower extremity edema.   LABS: Basic Metabolic Panel:  Recent Labs  65/78/46 0322 12/18/16 0254  NA 141 139  K 3.7 3.9  CL 109 106  CO2 25 27  GLUCOSE 119* 110*  BUN 18 19  CREATININE 1.17 1.26*  CALCIUM 8.9 9.0   Current Meds: . amiodarone  400 mg Oral BID  . apixaban  5 mg Oral BID  . aspirin EC  81 mg Oral QPM  . atorvastatin  80 mg Oral q1800  . cholecalciferol  2,000 Units Oral Daily  . DULoxetine  60 mg Oral QPM  . fluticasone  1 spray Each Nare Daily  . furosemide  20 mg Oral Daily  . loratadine  10 mg Oral Daily  . metoprolol succinate  25 mg Oral TID PC  . multivitamin with minerals  1 tablet Oral Daily  . omega-3 acid ethyl esters  2 g Oral Daily  . potassium chloride  10 mEq Oral Q0600  . sodium chloride flush  3 mL Intravenous Q12H  . tamsulosin  0.4 mg Oral Daily     ASSESSMENT AND PLAN: 61 y.o. male with strong family history of coronary artery disease, presented with a one-month history of "asthma" and found to have severe reduction in LV systolic function EF 15%, congestive heart failure, and significant coronary artery disease by angiography 12/14/16.   1. Acute systolic heart  failure/ischemic cardiomyopathy: He is known to have severe LV dysfunction this admission, likely mixed with ischemic component and also related to his atrial fibrillation. His volume status is stable this am. Will continue Lasix 20 mg daily. Will continue low dose beta blocker. Will not start ARB/Ace-inh given his relative hypotension. Repeat echo in 3 months.   2. Atrial fibrillation with RVR: Now in sinus rhythm on amiodarone. Tolerating Eliquis. Continue oral amiodarone load with 400 mg po BID for 2 weeks then will reduce his dose.   3. Severe 2 vessel coronary artery disease: He has chronic occlusion of the left circumflex with some angiographic features favorable for CTO-PCI. He also has moderate to severe LAD stenosis. It would be appropriate to revascularize after he has stabilized with respect his atrial fibrillation and congestive heart failure. He will need to follow up with Dr. Wyline Mood in [facility] over next week and then will need to have involvement of Dr. Swaziland from an interventional perspective in several months  to discuss PCI.   Discharge home today. He will need a work excuse for 3 months. He would like to have ambien to use at home.    Kevin Oconnell  3/6/201810:42 AM

## 2016-12-19 NOTE — Progress Notes (Signed)
Spoke with MD concerning pt's low BP's. Pt was given Ambien and Pacerone at bedtime and doctor states to just let pt sleep for now as long as he is asymptomatic. Will continue to monitor.

## 2016-12-19 NOTE — Progress Notes (Signed)
CARDIAC REHAB PHASE I   PRE:  Rate/Rhythm: 81 SR  BP:  Supine: 110/83  Sitting:   Standing:    SaO2: 96%RA  MODE:  Ambulation: 550 ft   POST:  Rate/Rhythm: 99 SR  BP:  Supine: 121/85  Sitting:   Standing:    SaO2: 99%RA 1215-1315 Pt walked 550 ft on RA with steady gait. No CP. Remained in NSR. Tolerated well. Gave pt CHF booklet and discussed when to call MD. Reviewed signs/symptoms of CHF, 2000 mg sodium restriction, and 2LFR.  Reviewed NTG use, ex ed. Pt knows to increase activity slowly and we discussed signs/symptoms for ending ex. Gave CRP 2 [place] brochure for pt to discuss with Dr Wyline Mood for referral since pt to return for possible staged PCI.    Luetta Nutting, RN BSN  12/19/2016 1:19 PM

## 2016-12-19 NOTE — Discharge Summary (Signed)
Discharge Summary    Patient ID: Kevin Oconnell,  MRN: [id number], DOB/AGE: July 29, 1956 61 y.o.  Admit date: 12/14/2016 Discharge date: 12/19/2016  Primary Care Provider: Kevin Fenton Primary Cardiologist: Dr Wyline Mood  Discharge Diagnoses    Principal Problem:   Ischemic cardiomyopathy, Dilated Active Problems:   Atrial fibrillation (HCC)   Chronic systolic CHF (congestive heart failure) (HCC)   Acute systolic heart failure (HCC)   Atrial fibrillation with RVR (HCC)   Persistent atrial fibrillation (HCC)   Coronary artery disease involving native coronary artery of native heart without angina pectoris   Chronic anticoagulation   Allergies No Known Allergies  Diagnostic Studies/Procedures    R/L CATH 12/14/2016  Mid RCA lesion, 35 %stenosed.  Mid RCA to Dist RCA lesion, 30 %stenosed.  Prox LAD to Mid LAD lesion, 80 %stenosed.  Mid LAD lesion, 70 %stenosed.  Ost Cx to Prox Cx lesion, 100 %stenosed.  There is severe left ventricular systolic dysfunction.  LV end diastolic pressure is normal.  The left ventricular ejection fraction is less than 25% by visual estimate.  1. 2 vessel obstructive CAD    - long segmental mid LAD disease 70-80%    - 100% proximal LCx - CTO with left to left collaterals. 2. Severe LV dysfunction with EF 15% 3. Normal right heart and LV filling pressures 4. Cardiac output 3.8 L/min with index 1.54. Plan: patient in Afib today with RVR. Rate 130 at rest. Given severe cardiomyopathy I recommend admission for rate control and initiation of amiodarone. Will anticoagulate with IV heparin. Consider transition to oral anticoagulation. If does not convert on amiodarone would consider DCCV early next week. I suspect his arrhythmia is contributing to his LV dysfunction. Consider options for revascularization once arrhythmia under control. The LAD could be stented relatively easily. Could consider CTO PCI of the LCx in the future.  _____________     History of Present Illness     61 yo male was seen 02/26 as a new patient by Dr Wyline Mood. Strong FH CAD and hx "asthma". Had PAF w/ recent admit [facility], not anticoagulated, echo that admit showed EF <20% w/ severe MR. Outpt L/R cath indicated, performed on 03/01.  Hospital Course     Consultants: None   Cardiac cath performed, results above. Pt tolerated the procedure well, but had hypotension afterwards (SBP 70s) and was in rapid atrial fib. He was given NS boluses and started on amiodarone IV.   He started to gradually improve. He was loaded with IV amio and TEE/DCCV was planned for Monday. He spontaneously converted to SR, but continued to have episodic afib and some short runs of VT as well. He was started on a low dose of Toprol XL. The ectopy gradually decreased and he was maintaining SR.   He had been on po Lasix, but was still volume overloaded and required IV Lasix for diuresis. Weight at discharge was 255 lbs.   His BP remained soft, no ACE/ARB possible at this time. His LVD is felt 2nd to CAD and rapid atrial fib. Hopefully, his EF will improve with medication. Repeat echo in 3 months.   He was started on Eliquis for anticoagulation (CHA2DS2VASc=2 w/ CAD/CHF).  He was not having any anginal symptoms and PCI was deferred. His CFX may be amenable to CTO intervention and his LAD stenosis is mod-severe. Once he has stabilized with respect to rhythm and CHF, consider PCI. Remain out of work for 3 months.   On 03/06, he was  seen by Dr Clifton James and all data were reviewed. His volume status was stable on PO Lasix. He was tolerating Eliquis. He was not having chest pain with ambulation. He was seen by cardiac rehab and ambulated with them. _____________  Discharge Vitals Blood pressure 91/73, pulse 71, temperature 97.7 F (36.5 C), temperature source Oral, resp. rate 11, height 6\' 4"  (1.93 m), weight 255 lb 4.8 oz (115.8 kg), SpO2 96 %.  Filed Weights   12/17/16 0410 12/18/16 0500  12/19/16 0500  Weight: 258 lb 14.4 oz (117.4 kg) 258 lb 12.8 oz (117.4 kg) 255 lb 4.8 oz (115.8 kg)    Labs & Radiologic Studies    CBC    Component Value Date/Time   WBC 5.3 12/15/2016 0409   RBC 4.69 12/15/2016 0409   HGB 13.4 12/15/2016 0409   HCT 41.3 12/15/2016 0409   PLT 242 12/15/2016 0409   MCV 88.1 12/15/2016 0409   MCH 28.6 12/15/2016 0409   MCHC 32.4 12/15/2016 0409   RDW 14.2 12/15/2016 0409    Basic Metabolic Panel  Recent Labs  12/17/16 0322 12/18/16 0254  NA 141 139  K 3.7 3.9  CL 109 106  CO2 25 27  GLUCOSE 119* 110*  BUN 18 19  CREATININE 1.17 1.26*  CALCIUM 8.9 9.0   _____________  Dg Chest 2 View Result Date: 12/15/2016 CLINICAL DATA:  CHF EXAM: CHEST  2 VIEW COMPARISON:  12/05/2016 FINDINGS: Heart size upper normal. Mild vascular congestion. Small bilateral effusions remain. Mild interstitial lung markings most consistent with mild edema. Negative for pneumonia. Calcified aortopulmonary lymph node IMPRESSION: Small bilateral effusions and prominent interstitial markings most consistent with mild congestive heart failure. Electronically Signed   By: Marlan Palau M.D.   On: 12/15/2016 14:01    Disposition   Pt is being discharged home today in good condition.  Follow-up Plans & Appointments     Discharge Instructions    (HEART FAILURE PATIENTS) Call MD:  Anytime you have any of the following symptoms: 1) 3 pound weight gain in 24 hours or 5 pounds in 1 week 2) shortness of breath, with or without a dry hacking cough 3) swelling in the hands, feet or stomach 4) if you have to sleep on extra pillows at night in order to breathe.    Complete by:  As directed    Diet - low sodium heart healthy    Complete by:  As directed    Discharge instructions    Complete by:  As directed    Daily weights   Increase activity slowly    Complete by:  As directed       Discharge Medications   Current Discharge Medication List    START taking these  medications   Details  amiodarone (PACERONE) 200 MG tablet Take 2 tablets (400 mg total) by mouth 2 (two) times daily. 2 tabs bid x 2 weeks, then 2 tabs daily Qty: 75 tablet, Refills: 11    apixaban (ELIQUIS) 5 MG TABS tablet Take 1 tablet (5 mg total) by mouth 2 (two) times daily. Qty: 60 tablet, Refills: 11    atorvastatin (LIPITOR) 80 MG tablet Take 1 tablet (80 mg total) by mouth daily at 6 PM. Qty: 30 tablet, Refills: 11    zolpidem (AMBIEN) 10 MG tablet Take 1 tablet (10 mg total) by mouth at bedtime as needed for sleep. Temporary rx, see PCP if additional help with sleep needed Qty: 20 tablet, Refills: 0  CONTINUE these medications which have CHANGED   Details  metoprolol succinate (TOPROL XL) 25 MG 24 hr tablet Take 1 tablet (25 mg total) by mouth 2 (two) times daily. Qty: 60 tablet, Refills: 3      CONTINUE these medications which have NOT CHANGED   Details  aspirin EC 81 MG tablet Take 81 mg by mouth every evening.    Cholecalciferol (VITAMIN D) 2000 units tablet Take 2,000 Units by mouth daily.    DULoxetine (CYMBALTA) 60 MG capsule Take 1 capsule (60 mg total) by mouth daily. Qty: 90 capsule, Refills: 3    furosemide (LASIX) 20 MG tablet Take 1 tablet (20 mg total) by mouth daily. Qty: 30 tablet, Refills: 3    Multiple Vitamin (MULTIVITAMIN WITH MINERALS) TABS tablet Take 1 tablet by mouth daily.    Omega-3 Fatty Acids (FISH OIL) 1000 MG CAPS Take 1,000 mg by mouth.    potassium chloride (K-DUR) 10 MEQ tablet Take 1 tablet (10 mEq total) by mouth daily at 6 (six) AM. Qty: 30 tablet, Refills: 3    Spacer/Aero Chamber Mouthpiece MISC 1 each by Does not apply route every 6 (six) hours as needed. Qty: 1 each, Refills: 0   Associated Diagnoses: SOB (shortness of breath)    tamsulosin (FLOMAX) 0.4 MG CAPS capsule Take 1 capsule (0.4 mg total) by mouth daily. Qty: 90 capsule, Refills: 3    vitamin C (ASCORBIC ACID) 500 MG tablet Take 500 mg by mouth daily.      albuterol (PROVENTIL HFA;VENTOLIN HFA) 108 (90 Base) MCG/ACT inhaler Inhale 2 puffs into the lungs every 6 (six) hours as needed for wheezing or shortness of breath. Qty: 1 Inhaler, Refills: 2   Associated Diagnoses: SOB (shortness of breath)         Outstanding Labs/Studies   None   Duration of Discharge Encounter   Greater than 30 minutes including physician time.  Melida Quitter NP 12/19/2016, 12:48 PM

## 2016-12-25 ENCOUNTER — Telehealth: Payer: Self-pay | Admitting: Family Medicine

## 2016-12-25 MED ORDER — AMOXICILLIN-POT CLAVULANATE 875-125 MG PO TABS: 1.0000 | ORAL_TABLET | Freq: Two times a day (BID) | ORAL | 0 refills | Status: DC

## 2016-12-25 NOTE — Telephone Encounter (Signed)
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

## 2016-12-25 NOTE — Telephone Encounter (Signed)
What symptoms do you have? Congestion, tinged and bloody . Sinus pain and swelling, low grade fever  How long have you been sick? 1 1/2 days  Have you been seen for this problem? No. Not supposed to come out in this temp due to his heart.  If your provider decides to give you a prescription, which pharmacy would you like for it to be sent to? [facility].   Patient informed that this information will be sent to the clinical staff for review and that they should receive a follow up call.

## 2016-12-25 NOTE — Telephone Encounter (Signed)
Patient with recent CHF diagnosis.  Now with symptoms consistent with acute sinusitis.  We have inclement weather today in our clinic will be closing, there is very little chance of him being able to be seen today.  Given recent severe illness I would go ahead and recommend treatment with Augmentin for acute sinusitis.  I would recommend a very low threshold for medical evaluation in urgent care or the ER if he has any true fever over 100.3, worsening dyspnea, or other signs of infection besides his sinusitis symptoms.  Murtis Sink, MD [facility] 12/25/2016, 11:07 AM

## 2016-12-25 NOTE — Telephone Encounter (Signed)
Wife aware abx sent in & recommendations

## 2017-01-04 ENCOUNTER — Encounter: Payer: Self-pay | Admitting: Cardiology

## 2017-01-04 ENCOUNTER — Ambulatory Visit (INDEPENDENT_AMBULATORY_CARE_PROVIDER_SITE_OTHER): Admitting: Cardiology

## 2017-01-04 VITALS — BP 87/54 | HR 76 | Ht 76.0 in | Wt 254.2 lb

## 2017-01-04 DIAGNOSIS — I2510 Atherosclerotic heart disease of native coronary artery without angina pectoris: Secondary | ICD-10-CM

## 2017-01-04 DIAGNOSIS — I5022 Chronic systolic (congestive) heart failure: Secondary | ICD-10-CM

## 2017-01-04 DIAGNOSIS — I4891 Unspecified atrial fibrillation: Secondary | ICD-10-CM

## 2017-01-04 NOTE — Progress Notes (Signed)
Clinical Summary Mr. Rivkin is a 61 y.o.male seen today for follow up of the following medical problems.   1.PAF - admit to [facility] with new onset afib with RVR - converted back to NSR during admission. Discharged on oral dilt - never had palpitations. No symptoms since discharge - was not started on anticoag at time of hospitalization.    - episode of afib with RVR at time of presentation for outpatient cath. Admitted and started on IV amiodarone - converted to NSR on amio, though did have short runs of afib and NSVT. Started on low dose Toprol XL - on amiodarone 400mg  bid. Now down to 400mg  daily starting on Wednesday.  - no bleeding troubles on eliquis.   2. Acute systolic HF - echo 11/2016 at [facility] shows LVEF <20%, severe global hypokinesis, severe MR, mild TR,  - 12/2016 cath 80% prox to mid LAD, LCX 100%, RCA 30%. RHC: mean PA 21, PCWP 16, CI 1.5 - recent discharge weight 255 lbs.   - soft bp's, not started on ACE/ARB. - denies any LE edema. Weight down significantly. Home weights stabel around 250-252. Avoiding NSAIDs - occasional dizziness he associates with sinus troubles. No orthostatic symptoms.    3. CAD - 2 vessel CAD as reported above - medical management at this time. Goal to optimize CHF management and afib control over the next few months, consider PCI at that time.  - occasional indigestion like pain left sided.    Past Medical History:  Diagnosis Date  . Allergy   . Arthritis   . Asthma   . Heart murmur      No Known Allergies   Current Outpatient Prescriptions  Medication Sig Dispense Refill  . albuterol (PROVENTIL HFA;VENTOLIN HFA) 108 (90 Base) MCG/ACT inhaler Inhale 2 puffs into the lungs every 6 (six) hours as needed for wheezing or shortness of breath. 1 Inhaler 2  . amiodarone (PACERONE) 200 MG tablet Take 2 tablets (400 mg total) by mouth 2 (two) times daily. 2 tabs bid x 2 weeks, then 2 tabs daily 75 tablet 11  .  amoxicillin-clavulanate (AUGMENTIN) 875-125 MG tablet Take 1 tablet by mouth 2 (two) times daily. 20 tablet 0  . apixaban (ELIQUIS) 5 MG TABS tablet Take 1 tablet (5 mg total) by mouth 2 (two) times daily. 60 tablet 11  . aspirin EC 81 MG tablet Take 81 mg by mouth every evening.    Marland Kitchen atorvastatin (LIPITOR) 80 MG tablet Take 1 tablet (80 mg total) by mouth daily at 6 PM. 30 tablet 11  . Cholecalciferol (VITAMIN D) 2000 units tablet Take 2,000 Units by mouth daily.    . DULoxetine (CYMBALTA) 60 MG capsule Take 1 capsule (60 mg total) by mouth daily. (Patient taking differently: Take 60 mg by mouth every evening. ) 90 capsule 3  . furosemide (LASIX) 20 MG tablet Take 1 tablet (20 mg total) by mouth daily. 30 tablet 3  . metoprolol succinate (TOPROL XL) 25 MG 24 hr tablet Take 1 tablet (25 mg total) by mouth 2 (two) times daily. 60 tablet 3  . Multiple Vitamin (MULTIVITAMIN WITH MINERALS) TABS tablet Take 1 tablet by mouth daily.    . Omega-3 Fatty Acids (FISH OIL) 1000 MG CAPS Take 1,000 mg by mouth.    . potassium chloride (K-DUR) 10 MEQ tablet Take 1 tablet (10 mEq total) by mouth daily at 6 (six) AM. (Patient taking differently: Take 10 mEq by mouth daily. ) 30 tablet 3  .  Spacer/Aero Chamber Mouthpiece MISC 1 each by Does not apply route every 6 (six) hours as needed. (Patient taking differently: 1 each by Does not apply route every 6 (six) hours as needed (for use with inhaler). ) 1 each 0  . tamsulosin (FLOMAX) 0.4 MG CAPS capsule Take 1 capsule (0.4 mg total) by mouth daily. (Patient taking differently: Take 0.4 mg by mouth every evening. ) 90 capsule 3  . vitamin C (ASCORBIC ACID) 500 MG tablet Take 500 mg by mouth daily.    Marland Kitchen zolpidem (AMBIEN) 10 MG tablet Take 1 tablet (10 mg total) by mouth at bedtime as needed for sleep. Temporary rx, see PCP if additional help with sleep needed 20 tablet 0   No current facility-administered medications for this visit.      Past Surgical History:    Procedure Laterality Date  . RIGHT/LEFT HEART CATH AND CORONARY ANGIOGRAPHY N/A 12/14/2016   Procedure: Right/Left Heart Cath and Coronary Angiography;  Surgeon: Peter M Swaziland, MD;  Location: [facility] INVASIVE CV LAB;  Service: Cardiovascular;  Laterality: N/A;  . SHOULDER SURGERY Right      No Known Allergies    Family History  Problem Relation Age of Onset  . Heart disease Mother   . Diabetes Mother   . Stroke Father      Social History Mr. Ahonen reports that he quit smoking about 6 years ago. He has never used smokeless tobacco. Mr. Rothlisberger reports that he does not drink alcohol.   Review of Systems CONSTITUTIONAL: No weight loss, fever, chills, weakness or fatigue.  HEENT: Eyes: No visual loss, blurred vision, double vision or yellow sclerae.No hearing loss, sneezing, congestion, runny nose or sore throat.  SKIN: No rash or itching.  CARDIOVASCULAR: per hpi RESPIRATORY: No shortness of breath, cough or sputum.  GASTROINTESTINAL: No anorexia, nausea, vomiting or diarrhea. No abdominal pain or blood.  GENITOURINARY: No burning on urination, no polyuria NEUROLOGICAL: No headache, dizziness, syncope, paralysis, ataxia, numbness or tingling in the extremities. No change in bowel or bladder control.  MUSCULOSKELETAL: No muscle, back pain, joint pain or stiffness.  LYMPHATICS: No enlarged nodes. No history of splenectomy.  PSYCHIATRIC: No history of depression or anxiety.  ENDOCRINOLOGIC: No reports of sweating, cold or heat intolerance. No polyuria or polydipsia.  Marland Kitchen   Physical Examination Vitals:   01/04/17 1337  BP: (!) 87/54  Pulse: 76   Vitals:   01/04/17 1337  Weight: 254 lb 3.2 oz (115.3 kg)  Height: 6\' 4"  (1.93 m)    Gen: resting comfortably, no acute distress HEENT: no scleral icterus, pupils equal round and reactive, no palptable cervical adenopathy,  CV: RRR, no m/r/g, no jvd Resp: Clear to auscultation bilaterally GI: abdomen is soft, non-tender,  non-distended, normal bowel sounds, no hepatosplenomegaly MSK: extremities are warm, no edema.  Skin: warm, no rash Neuro:  no focal deficits Psych: appropriate affect     Assessment and Plan   1. Chronic systolic HF - no current symptoms, appears euvolemic - soft bp's limits medical therapy at this time. Continue current meds - follow LVEF over next few months with afib control, may need revascularization pending progress.  - patient with severe LV systolic dysfunction, low CI by RHC, soft bp's limiting medical therapy. Due to his severe CHF we will refer to CHF clinic to help with his management.  - check BMET/Mg on diuretic   2. PAF - new diagnosis during recent admission. No palpitations, unclear duration.  - on amio 400mg  daily,  continue for 2 weeksn then change to 200mg  daily. EKG today shows SR - CHADS2Vasc score is 2, continue anticoag  3. CAD - continue medical therapy - possible revasc in future depending LVEF response to medical therapy and arrhythmia control     [name], M.D.

## 2017-01-04 NOTE — Patient Instructions (Signed)
Your physician recommends that you schedule a follow-up appointment in: 3-4 WEEKS WITH DR. BRANCH  Your physician has recommended you make the following change in your medication: TAKE AMIODARONE 2 TABLETS (400 MG) DAILY FOR 5 DAYS THEN TAKE 1 TABLET (200 MG) DAILY   You have been referred to HEART FAILURE CLINIC  Your physician recommends that you return for lab work BMET, [employer]

## 2017-01-05 ENCOUNTER — Telehealth: Payer: Self-pay | Admitting: *Deleted

## 2017-01-05 NOTE — Telephone Encounter (Signed)
-----   Message from [name], MD sent at 01/05/2017  1:41 PM EDT ----- Labs show that he may be starting to get get a little bit dehydrated. Please change his lasix to 20mg  daily PRN swelling or 3 pound weight gain. Make sure he documents his weight today and uses that as a reference going forward.    Dominga Ferry MD

## 2017-01-05 NOTE — Telephone Encounter (Signed)
Pt voiced understanding - updated medication list  - routed to pcp  

## 2017-01-08 ENCOUNTER — Encounter: Payer: Self-pay | Admitting: *Deleted

## 2017-01-08 ENCOUNTER — Telehealth: Payer: Self-pay | Admitting: *Deleted

## 2017-01-08 NOTE — Telephone Encounter (Signed)
-----   Message from [name], MD sent at 01/06/2017  7:21 PM EDT ----- Please fax the following letter to [name] at [phone]  To whom it may concern,  Mr. Kevin Oconnell has recently been diagnosed with multiple serious heart conditions. He has severe systolic heart failure, coronary artery disease, and atrial fibrillation. These conditions will require aggressive medical therapy and several clinic appointments over the next several months. With aggressive therapy the hope is his condition will improve, however if he does not respond to therapy then the overall prognosis is poor. Please consider any arrangements that may allow his family members to be present to help with his medical care during this important period of time.    Dina Rich MD

## 2017-01-08 NOTE — Telephone Encounter (Signed)
Letter sent through [facility]

## 2017-01-10 ENCOUNTER — Other Ambulatory Visit: Payer: Self-pay | Admitting: *Deleted

## 2017-01-10 MED ORDER — POTASSIUM CHLORIDE ER 10 MEQ PO TBCR: 10.0000 meq | EXTENDED_RELEASE_TABLET | Freq: Every day | ORAL | 1 refills | Status: DC

## 2017-01-11 ENCOUNTER — Other Ambulatory Visit: Payer: Self-pay

## 2017-01-11 ENCOUNTER — Other Ambulatory Visit: Payer: Self-pay | Admitting: *Deleted

## 2017-01-11 MED ORDER — DULOXETINE HCL 60 MG PO CPEP: 60.0000 mg | ORAL_CAPSULE | Freq: Every day | ORAL | 3 refills | Status: DC

## 2017-01-11 MED ORDER — TAMSULOSIN HCL 0.4 MG PO CAPS: 0.4000 mg | ORAL_CAPSULE | Freq: Every day | ORAL | 3 refills | Status: DC

## 2017-01-11 MED ORDER — METOPROLOL SUCCINATE ER 25 MG PO TB24: 25.0000 mg | ORAL_TABLET | Freq: Two times a day (BID) | ORAL | 3 refills | Status: DC

## 2017-01-11 MED ORDER — APIXABAN 5 MG PO TABS: 5.0000 mg | ORAL_TABLET | Freq: Two times a day (BID) | ORAL | 3 refills | Status: DC

## 2017-01-11 MED ORDER — ATORVASTATIN CALCIUM 80 MG PO TABS: 80.0000 mg | ORAL_TABLET | Freq: Every day | ORAL | 1 refills | Status: DC

## 2017-01-11 MED ORDER — AMIODARONE HCL 200 MG PO TABS: 200.0000 mg | ORAL_TABLET | Freq: Every day | ORAL | 1 refills | Status: DC

## 2017-01-24 ENCOUNTER — Encounter: Payer: Self-pay | Admitting: Cardiology

## 2017-01-24 ENCOUNTER — Ambulatory Visit (INDEPENDENT_AMBULATORY_CARE_PROVIDER_SITE_OTHER): Admitting: Cardiology

## 2017-01-24 VITALS — BP 99/68 | HR 64 | Ht 76.0 in | Wt 253.0 lb

## 2017-01-24 DIAGNOSIS — I4891 Unspecified atrial fibrillation: Secondary | ICD-10-CM | POA: Diagnosis not present

## 2017-01-24 DIAGNOSIS — I5022 Chronic systolic (congestive) heart failure: Secondary | ICD-10-CM | POA: Diagnosis not present

## 2017-01-24 DIAGNOSIS — I2510 Atherosclerotic heart disease of native coronary artery without angina pectoris: Secondary | ICD-10-CM | POA: Diagnosis not present

## 2017-01-24 MED ORDER — LISINOPRIL 2.5 MG PO TABS: 2.5000 mg | ORAL_TABLET | Freq: Every day | ORAL | 1 refills | Status: DC

## 2017-01-24 NOTE — Patient Instructions (Signed)
Your physician recommends that you schedule a follow-up appointment in: 6 WEEKS WITH DR [facility]  Your physician has recommended you make the following change in your medication:   START LISINOPRIL 2.5 MG DAILY  ONLY TAKE POTASSIUM WHEN TAKING LASIX   Your physician recommends that you return for lab work in: 2 WEEKS BMP/MG  Thank you for choosing [facility]!!

## 2017-01-24 NOTE — Progress Notes (Signed)
Clinical Summary Kevin Oconnell is a 61 y.o.male seen today for follow up of the following medical problems.   1.PAF - admit to [facility] with new onset afib with RVR - converted back to NSR during admission. Discharged on oral dilt - never had palpitations. No symptoms since discharge - wasnot started on anticoag at time of hospitalization.    - episode of afib with RVR at time of presentation for outpatient cath. Admitted and started on IV amiodarone - converted to NSR on amio, though did have short runs of afib and NSVT. Started on low dose Toprol XL  - no recent palpitations - no bleeding troubles on eliquis   2. Acute systolic HF - echo 11/2016 at [facility] shows LVEF <20%, severe global hypokinesis, severe MR, mild TR,  - 12/2016 cath 80% prox to mid LAD, LCX 100%, RCA 30%. RHC: mean PA 21, PCWP 16, CI 1.5 - recent discharge weight 255 lbs.  - soft bp's have limited medical therapy,have not started ACE-I/ARB - we recently changed his lasix to 20mg  prn due to uptrend I Cr.   - no recent SOB or DOE. No recent edema - stable weights. Home weights stable around 245-250  - no lightheadness or dizziness  - compliant with with meds. Limiting sodium intake. Avoiding NSAIDs - taking lasix prn, has not needed regularly.  - Home bp/s low 100s/60s.   3. CAD - 2 vessel CAD as reported above - medical management at this time. Goal to optimize CHF management and afib control over the next few months, consider PCI at that time.   - no recent chest pain since last visit   ZO:XWRUE custodial worker and bus driver. Has not gone back to work since his CHF diagnosis.   Past Medical History:  Diagnosis Date  . Allergy   . Arthritis   . Asthma   . Heart murmur      No Known Allergies   Current Outpatient Prescriptions  Medication Sig Dispense Refill  . albuterol (PROVENTIL HFA;VENTOLIN HFA) 108 (90 Base) MCG/ACT inhaler Inhale 2 puffs into the lungs every 6 (six) hours  as needed for wheezing or shortness of breath. 1 Inhaler 2  . amiodarone (PACERONE) 200 MG tablet Take 1 tablet (200 mg total) by mouth daily. TAKE 2 TABLETS DAILY FOR 2 WEEKS THEN TAKE 1 TABLET DAILY 01/04/17 90 tablet 1  . apixaban (ELIQUIS) 5 MG TABS tablet Take 1 tablet (5 mg total) by mouth 2 (two) times daily. 180 tablet 3  . aspirin EC 81 MG tablet Take 81 mg by mouth every evening.    Marland Kitchen atorvastatin (LIPITOR) 80 MG tablet Take 1 tablet (80 mg total) by mouth daily at 6 PM. 90 tablet 1  . Cholecalciferol (VITAMIN D) 2000 units tablet Take 2,000 Units by mouth daily.    . DULoxetine (CYMBALTA) 60 MG capsule Take 1 capsule (60 mg total) by mouth daily. 90 capsule 3  . furosemide (LASIX) 20 MG tablet Take 20 mg by mouth daily as needed. SWELLING OR WEIGHT GAIN OF 3LBS    . metoprolol succinate (TOPROL XL) 25 MG 24 hr tablet Take 1 tablet (25 mg total) by mouth 2 (two) times daily. 180 tablet 3  . Multiple Vitamin (MULTIVITAMIN WITH MINERALS) TABS tablet Take 1 tablet by mouth daily.    . Omega-3 Fatty Acids (FISH OIL) 1000 MG CAPS Take 1,000 mg by mouth every other day.     . potassium chloride (K-DUR) 10 MEQ tablet  Take 1 tablet (10 mEq total) by mouth daily at 6 (six) AM. 90 tablet 1  . Spacer/Aero Chamber Mouthpiece MISC 1 each by Does not apply route every 6 (six) hours as needed. (Patient taking differently: 1 each by Does not apply route every 6 (six) hours as needed (for use with inhaler). ) 1 each 0  . tamsulosin (FLOMAX) 0.4 MG CAPS capsule Take 1 capsule (0.4 mg total) by mouth daily. 90 capsule 3  . vitamin C (ASCORBIC ACID) 500 MG tablet Take 500 mg by mouth daily.    Marland Kitchen zolpidem (AMBIEN) 10 MG tablet Take 1 tablet (10 mg total) by mouth at bedtime as needed for sleep. Temporary rx, see PCP if additional help with sleep needed 20 tablet 0   No current facility-administered medications for this visit.      Past Surgical History:  Procedure Laterality Date  . RIGHT/LEFT HEART CATH  AND CORONARY ANGIOGRAPHY N/A 12/14/2016   Procedure: Right/Left Heart Cath and Coronary Angiography;  Surgeon: Peter M Swaziland, MD;  Location: [facility] INVASIVE CV LAB;  Service: Cardiovascular;  Laterality: N/A;  . SHOULDER SURGERY Right      No Known Allergies    Family History  Problem Relation Age of Onset  . Heart disease Mother   . Diabetes Mother   . Stroke Father      Social History Kevin Oconnell reports that he quit smoking about 6 years ago. He has never used smokeless tobacco. Kevin Oconnell reports that he does not drink alcohol.   Review of Systems CONSTITUTIONAL: No weight loss, fever, chills, weakness or fatigue.  HEENT: Eyes: No visual loss, blurred vision, double vision or yellow sclerae.No hearing loss, sneezing, congestion, runny nose or sore throat.  SKIN: No rash or itching.  CARDIOVASCULAR: per HPI RESPIRATORY: No shortness of breath, cough or sputum.  GASTROINTESTINAL: No anorexia, nausea, vomiting or diarrhea. No abdominal pain or blood.  GENITOURINARY: No burning on urination, no polyuria NEUROLOGICAL: No headache, dizziness, syncope, paralysis, ataxia, numbness or tingling in the extremities. No change in bowel or bladder control.  MUSCULOSKELETAL: No muscle, back pain, joint pain or stiffness.  LYMPHATICS: No enlarged nodes. No history of splenectomy.  PSYCHIATRIC: No history of depression or anxiety.  ENDOCRINOLOGIC: No reports of sweating, cold or heat intolerance. No polyuria or polydipsia.  Marland Kitchen   Physical Examination Vitals:   01/24/17 0916  BP: 99/68  Pulse: 64   Vitals:   01/24/17 0916  Weight: 253 lb (114.8 kg)  Height: 6\' 4"  (1.93 m)    Gen: resting comfortably, no acute distress HEENT: no scleral icterus, pupils equal round and reactive, no palptable cervical adenopathy,  CV: RRR, no m/r/g, no jvd Resp: Clear to auscultation bilaterally GI: abdomen is soft, non-tender, non-distended, normal bowel sounds, no hepatosplenomegaly MSK:  extremities are warm, no edema.  Skin: warm, no rash Neuro:  no focal deficits Psych: appropriate affect    Assessment and Plan  1. Chronic systolic HF - no current symptoms - soft bp's limits medical therapy. We will try starting lisinopril 2.5mg  daily.  - follow LVEF over next few months with afib control, may need revascularization pending progress.  - patient with severe LV systolic dysfunction, low CI by RHC, soft bp's limiting medical therapy. Fairly limited symptoms at this time. Due to his severe CHF we have referred for CHF clinic.  - check BMET/Mg after starting ACE-I  2. PAF - new diagnosis during recent admission. No palpitations, unclear duration.  - on oral  amio. EKG in clinic today shows NSR - CHADS2Vasc score is 2, he will continue anticoag  3. CAD - continue medical therapy - we may consider  revasc in future depending LVEF response to medical therapy and control of his afib.    F/u 1 month    [name], M.D.

## 2017-01-25 ENCOUNTER — Ambulatory Visit (INDEPENDENT_AMBULATORY_CARE_PROVIDER_SITE_OTHER): Admitting: Pediatrics

## 2017-01-25 ENCOUNTER — Other Ambulatory Visit: Payer: Self-pay | Admitting: *Deleted

## 2017-01-25 ENCOUNTER — Encounter: Payer: Self-pay | Admitting: Pediatrics

## 2017-01-25 VITALS — BP 106/73 | HR 64 | Temp 97.1°F | Ht 76.0 in | Wt 253.4 lb

## 2017-01-25 DIAGNOSIS — G4700 Insomnia, unspecified: Secondary | ICD-10-CM | POA: Diagnosis not present

## 2017-01-25 MED ORDER — POTASSIUM CHLORIDE CRYS ER 10 MEQ PO TBCR: EXTENDED_RELEASE_TABLET | ORAL | 3 refills | Status: DC

## 2017-01-25 MED ORDER — ZOLPIDEM TARTRATE 10 MG PO TABS: 10.0000 mg | ORAL_TABLET | Freq: Every evening | ORAL | 2 refills | Status: DC | PRN

## 2017-01-25 NOTE — Progress Notes (Signed)
  Subjective:   Patient ID: Kevin Oconnell, male    DOB: Nov 26, 1955, 61 y.o.   MRN: [id number] CC: Medication Refill (Ambien)  HPI: Kevin Oconnell is a 61 y.o. male presenting for Medication Refill (Ambien)  Pulse normal Doesn't feel it when his heart changes to afib  Has had problems with insomnia for years Worse recently with recent health problems, new dx CHF, afib Worried about taking care of family, bills coming, has been out of work Goes to sleep fine, gets up to use the bathroom and has a hard time going back to sleep Not on lasix anymore Caffeine only one cup in the morning of coffee Has a hard time staying asleep Not always going to the bathroom  No longer on daily lasix, taking only as needed Weight has been stable   Relevant past medical, surgical, family and social history reviewed. Allergies and medications reviewed and updated. History  Smoking Status  . Former Smoker  . Packs/day: 2.00  . Years: 35.00  . Types: Cigarettes  . Start date: 07/12/1975  . Quit date: 02/13/2010  Smokeless Tobacco  . Never Used   ROS: Per HPI   Objective:    BP 106/73   Pulse 64   Temp 97.1 F (36.2 C) (Oral)   Ht 6\' 4"  (1.93 m)   Wt 253 lb 6.4 oz (114.9 kg)   BMI 30.84 kg/m   Wt Readings from Last 3 Encounters:  01/25/17 253 lb 6.4 oz (114.9 kg)  01/24/17 253 lb (114.8 kg)  01/04/17 254 lb 3.2 oz (115.3 kg)    Gen: NAD, alert, cooperative with exam, NCAT EYES: EOMI, no conjunctival injection, or no icterus CV: NRRR, normal S1/S2, no murmur, distal pulses 2+ b/l Resp: CTABL, no wheezes, normal WOB Abd: +BS, soft, NTND Ext: No edema, warm Neuro: Alert and oriented, strength equal b/l UE and LE, coordination grossly normal MSK: normal muscle bulk  Assessment & Plan:  Kevin Oconnell was seen today for medication refill.  Diagnoses and all orders for this visit:  Insomnia, unspecified type Ongoing symptoms Discussed sleep hygiene, trial of melatonin If anxiety any worse let  me know Chronic neck/back pain also contributing Pt open to trying alternate ways to help with sleep Has tried [place] for the past month, not taking every night Gave Rx for ambien as below to take no more than twice a week when needed, needs to be seen for further refills -     zolpidem (AMBIEN) 10 MG tablet; Take 1 tablet (10 mg total) by mouth at bedtime as needed for sleep.  Follow up plan: Return in about 3 months (around 04/26/2017). Rex Kras, MD [place] [facility]

## 2017-01-31 ENCOUNTER — Encounter: Payer: Self-pay | Admitting: *Deleted

## 2017-02-06 ENCOUNTER — Encounter: Payer: Self-pay | Admitting: *Deleted

## 2017-02-07 ENCOUNTER — Telehealth: Payer: Self-pay | Admitting: *Deleted

## 2017-02-07 NOTE — Telephone Encounter (Signed)
-----   Message from [name], MD sent at 02/06/2017  1:05 PM EDT ----- Please print a copy of the cath report and fax to whoever is requesting this information. He has not undergone revascularization at this time because there is not emergent indication. Our initial goal is to treat his atrial fibrillation which had previously been out of control and maximize medications to help improve his heart failure. Pending this response to this initial management he may be considered for revascularization at that time. There is no way to estimate the prognosis or recovery time, as it all depends on how his heart responds to initial medical therapy. I cannot commend on the medical issues of his wife, she would need to discuss with her pcp   Dina Rich MD ----- Message ----- From: Albertine Patricia, CMA Sent: 02/06/2017  10:18 AM To: [name], MD  You wrote a letter for this pt in March stating his cardiac dx - pt says now they are requesting the following information:  Reason pt cannot have surgery at this time and prognosis if pt doesn't have surgery If he does have surgery at some point how long recovery time  Blockages and percentage of blockages  Asking if letter can state that wife has medical issues as well  I explained that I would ask you but that we couldn't state to her medical conditions since she wasn't a patient here.   Kierston Plasencia

## 2017-02-07 NOTE — Telephone Encounter (Signed)
Pt wife made aware that we would fax cath report and this note. Fax number under routing history from previous letter

## 2017-02-15 ENCOUNTER — Telehealth: Payer: Self-pay | Admitting: *Deleted

## 2017-02-15 NOTE — Telephone Encounter (Signed)
Pt aware - routed to pcp  

## 2017-02-15 NOTE — Telephone Encounter (Signed)
-----   Message from [name], MD sent at 02/15/2017  2:47 PM EDT ----- Labs look good   Dominga Ferry MD

## 2017-02-16 ENCOUNTER — Encounter (HOSPITAL_COMMUNITY): Payer: Self-pay | Admitting: Cardiology

## 2017-02-16 ENCOUNTER — Encounter (HOSPITAL_COMMUNITY): Payer: Self-pay

## 2017-02-16 ENCOUNTER — Encounter (HOSPITAL_COMMUNITY): Payer: Self-pay | Admitting: *Deleted

## 2017-02-16 ENCOUNTER — Ambulatory Visit (HOSPITAL_COMMUNITY)
Admission: RE | Admit: 2017-02-16 | Discharge: 2017-02-16 | Disposition: A | Discharge status: Home / Self Care | Source: Ambulatory Visit | Attending: Cardiology | Admitting: Cardiology

## 2017-02-16 VITALS — BP 101/72 | HR 68 | Ht 76.0 in | Wt 250.0 lb

## 2017-02-16 DIAGNOSIS — R000 Tachycardia, unspecified: Secondary | ICD-10-CM | POA: Diagnosis not present

## 2017-02-16 DIAGNOSIS — I2510 Atherosclerotic heart disease of native coronary artery without angina pectoris: Secondary | ICD-10-CM | POA: Diagnosis not present

## 2017-02-16 DIAGNOSIS — Z7901 Long term (current) use of anticoagulants: Secondary | ICD-10-CM | POA: Insufficient documentation

## 2017-02-16 DIAGNOSIS — I493 Ventricular premature depolarization: Secondary | ICD-10-CM | POA: Insufficient documentation

## 2017-02-16 DIAGNOSIS — Z87891 Personal history of nicotine dependence: Secondary | ICD-10-CM | POA: Insufficient documentation

## 2017-02-16 DIAGNOSIS — I5022 Chronic systolic (congestive) heart failure: Secondary | ICD-10-CM | POA: Diagnosis not present

## 2017-02-16 DIAGNOSIS — R609 Edema, unspecified: Secondary | ICD-10-CM | POA: Diagnosis not present

## 2017-02-16 DIAGNOSIS — I340 Nonrheumatic mitral (valve) insufficiency: Secondary | ICD-10-CM | POA: Diagnosis not present

## 2017-02-16 DIAGNOSIS — I4891 Unspecified atrial fibrillation: Secondary | ICD-10-CM | POA: Diagnosis not present

## 2017-02-16 DIAGNOSIS — I480 Paroxysmal atrial fibrillation: Secondary | ICD-10-CM | POA: Insufficient documentation

## 2017-02-16 DIAGNOSIS — G4733 Obstructive sleep apnea (adult) (pediatric): Secondary | ICD-10-CM | POA: Diagnosis not present

## 2017-02-16 LAB — COMPREHENSIVE METABOLIC PANEL
ALK PHOS: 71 U/L (ref 38–126)
ALT: 45 U/L (ref 17–63)
AST: 26 U/L (ref 15–41)
Albumin: 4.3 g/dL (ref 3.5–5.0)
Anion gap: 11 (ref 5–15)
BUN: 21 mg/dL — ABNORMAL HIGH (ref 6–20)
CALCIUM: 9.3 mg/dL (ref 8.9–10.3)
CO2: 24 mmol/L (ref 22–32)
CREATININE: 1.26 mg/dL — AB (ref 0.61–1.24)
Chloride: 104 mmol/L (ref 101–111)
Glucose, Bld: 127 mg/dL — ABNORMAL HIGH (ref 65–99)
Potassium: 4.4 mmol/L (ref 3.5–5.1)
SODIUM: 139 mmol/L (ref 135–145)
Total Bilirubin: 1.1 mg/dL (ref 0.3–1.2)
Total Protein: 6.7 g/dL (ref 6.5–8.1)

## 2017-02-16 LAB — CBC
HCT: 45.7 % (ref 39.0–52.0)
Hemoglobin: 14.8 g/dL (ref 13.0–17.0)
MCH: 28.6 pg (ref 26.0–34.0)
MCHC: 32.4 g/dL (ref 30.0–36.0)
MCV: 88.2 fL (ref 78.0–100.0)
PLATELETS: 280 10*3/uL (ref 150–400)
RBC: 5.18 MIL/uL (ref 4.22–5.81)
RDW: 15.3 % (ref 11.5–15.5)
WBC: 6.4 10*3/uL (ref 4.0–10.5)

## 2017-02-16 LAB — TSH: TSH: 2.058 u[IU]/mL (ref 0.350–4.500)

## 2017-02-16 LAB — BRAIN NATRIURETIC PEPTIDE: B Natriuretic Peptide: 262.9 pg/mL — ABNORMAL HIGH (ref 0.0–100.0)

## 2017-02-16 MED ORDER — SPIRONOLACTONE 25 MG PO TABS: 12.5000 mg | ORAL_TABLET | Freq: Every day | ORAL | 3 refills | Status: DC

## 2017-02-16 NOTE — Patient Instructions (Addendum)
Labs today (will call for abnormal results, otherwise no news is good news)  STOP taking Aspirin  START taking Spironolactone 12.5 mg (0.5 Tablet) Once Daily  You have been ordered to have a sleep study.  You have been referred for cardiac rehab at [facility], they will contact you for your initial appointment.   You may return to work with light duty.  No lifting/pulling over 30 lbs and take rest breaks as needed.   Lab work in 10 days (BMET)  Follow up in clinic in 3 weeks  Follow up with Dr. Shirlee Latch with an Echo in 6 weeks.

## 2017-02-18 NOTE — Addendum Note (Signed)
Encounter addended by: Laurey Morale, MD on: 02/18/2017 10:53 PM<BR>    Actions taken: Sign clinical note

## 2017-02-18 NOTE — Progress Notes (Addendum)
PCP: Dr. Ermalinda Memos Cardiology: Dr. Wyline Mood HF Cardiology: Dr. Shirlee Latch  61 yo with history of paroxysmal atrial fibrillation, CAD, and chronic systolic CHF was referred by Dr. Wyline Mood for CHF clinic evaluation.    Patient had no cardiac history prior to 2/18.  In 2/18, he developed the onset of exertional dyspnea. After steadily worsening dyspnea, he went to the ER at [facility] and was admitted.  Echo in 2/18 showed EF < 20% with severe MR.  He went back into NSR spontaneously.  He followed up with Dr. Wyline Mood and was set up for a cath in 3/18.  He was found to have a chronically occluded LCx and a long proximal to mid LAD stenosis.  No intervention was done.  He was noted to be back in rapid atrial fibrillation and was admitted.  He was diuresed and started on amiodarone, he converted back to NSR again spontaneously.   He is in NSR today.  He is generally feeling good.  No chest pain.  No palpitations. No lightheadedness.  No edema.  He does not get short of breath walking on flat ground, mild dyspnea with steps.  He does ok walking up a short hill.  SBP 100s-110s at home.  Weight is down 30 lbs since 2/18.  He is taking Lasix only about once a week.  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   ECG (personally reviewed): NSR, PVC, left axis deviation, QTc 486 msec  Labs (4/18): K 4.5, creatinine 1.0  PMH: 1. Atrial fibrillation: Paroxysmal.  2. 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  - Echo (2/18) with mild LV dilation, EF < 20%, severe mitral regurgitation.  - RHC/LHC (3/18): long 80% proximal-mid LAD stenosis, total occlusion of the LCx with left to left collaterals, RCA ok. Mean RA 7, PA 32/10, mean PCWP 16, LVEDP 12, CI 1.54.  3. CAD: LHC (3/18) with long 80% proximal-mid LAD stenosis, total occlusion of the LCx with left to left collaterals, RCA ok. 4. Mitral regurgitation:  Severe on 2/18 echo, ?functional.   SH: Bus driver and school custodian, lives in [place], quit smoking in 2011, married.   FH: Grandfather with MIs, mother with CABG and CHF.   ROS: All systems reviewed and negative except as per HPI.   Current Outpatient Prescriptions  Medication Sig Dispense Refill  . amiodarone (PACERONE) 200 MG tablet Take 1 tablet (200 mg total) by mouth daily. TAKE 2 TABLETS DAILY FOR 2 WEEKS THEN TAKE 1 TABLET DAILY 01/04/17 90 tablet 1  . apixaban (ELIQUIS) 5 MG TABS tablet Take 1 tablet (5 mg total) by mouth 2 (two) times daily. 180 tablet 3  . atorvastatin (LIPITOR) 80 MG tablet Take 1 tablet (80 mg total) by mouth daily at 6 PM. 90 tablet 1  . Cholecalciferol (VITAMIN D) 2000 units tablet Take 2,000 Units by mouth daily.    . DULoxetine (CYMBALTA) 60 MG capsule Take 1 capsule (60 mg total) by mouth daily. 90 capsule 3  . furosemide (LASIX) 20 MG tablet Take 20 mg by mouth daily as needed. SWELLING OR WEIGHT GAIN OF 3LBS    . lisinopril (PRINIVIL,ZESTRIL) 2.5 MG tablet Take 1 tablet (2.5 mg total) by mouth daily. 90 tablet 1  . metoprolol succinate (TOPROL XL) 25 MG 24 hr tablet Take 1 tablet (25 mg total) by mouth 2 (two) times daily. 180 tablet 3  . Multiple Vitamin (MULTIVITAMIN WITH MINERALS) TABS  tablet Take 1 tablet by mouth daily.    . Omega-3 Fatty Acids (FISH OIL) 1000 MG CAPS Take 1,000 mg by mouth every other day.     . potassium chloride (K-DUR,KLOR-CON) 10 MEQ tablet TAKE ONLY ON DAYS WHEN TAKING LASIX 90 tablet 3  . tamsulosin (FLOMAX) 0.4 MG CAPS capsule Take 1 capsule (0.4 mg total) by mouth daily. 90 capsule 3  . vitamin C (ASCORBIC ACID) 500 MG tablet Take 500 mg by mouth daily.    Marland Kitchen zolpidem (AMBIEN) 10 MG tablet Take 1 tablet (10 mg total) by mouth at bedtime as needed for sleep. 15 tablet 2  . spironolactone (ALDACTONE) 25 MG tablet Take 0.5 tablets (12.5 mg total) by mouth daily. 45 tablet 3   No current facility-administered medications  for this encounter.    BP 101/72 (BP Location: Right Arm, Patient Position: Sitting, Cuff Size: Normal)   Pulse 68   Ht 6\' 4"  (1.93 m)   Wt 250 lb (113.4 kg)   SpO2 99%   BMI 30.43 kg/m  General: NAD Neck: Thick, no JVD, no thyromegaly or thyroid nodule.  Lungs: Clear to auscultation bilaterally with normal respiratory effort. CV: Nondisplaced PMI.  Heart regular S1/S2, no S3/S4, 1/6 HSM apex.  No peripheral edema.  No carotid bruit.  Normal pedal pulses.  Abdomen: Soft, nontender, no hepatosplenomegaly, no distention.  Skin: Intact without lesions or rashes.  Neurologic: Alert and oriented x 3.  Psych: Normal affect. Extremities: No clubbing or cyanosis.  HEENT: Normal.   Assessment/Plan: 1. Atrial fibrillation: Paroxysmal.  He had atrial fibrillation with RVR in 2/18 and again in 3/18.  He is in NSR today on amiodarone.  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  - Continue amiodarone for now.  Check CMET and TSH today.  We discussed the need for regular eye exams.   - Continue Eliquis, check CBC.  - I think that he eventually would benefit from atrial fibrillation ablation ([facility]-HF data).  2. 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  Echo in 2/18 with EF < 20%.  On exam today, he is euvolemic.  NYHA class II symptoms.  He appears to do much better in NSR.   - Maintain NSR as above.  - Continue Toprol XL and lisinopril at current doses.  BP is soft, not a lot of room for titration.  - Add spironolactone 12.5 mg daily.  BMET today and again in 2 wks.  - Continue to use Lasix as needed.  - Repeat echo in 6 weeks, will need ICD if EF < 35%.   QRS is not wide, not a CRT candidate.  3. CAD: 3/18 cath with chronically occluded LCx with collaterals and long up to 80% proximal to mid LAD stenosis.  No chest pain.  No intervention in 3/18, not ACS.   - Can stop ASA given Eliquis use.   - Continue atorvastatin.  - It would be reasonable to consider full revascularization given low EF.  Will need to send him back to Dr. Swaziland when he is medically stabilized for evaluation for LAD PCI and CTO procedure to LCx.   - I will refer him for cardiac rehab at [facility].  4. OSA: Patient snores and has daytime sleepiness.   - I will arrange for sleep study.  Treatment of sleep apnea if present may decrease recurrence of atrial fibrillation. 5. Mitral regurgitation: Severe on [place] echo.  May be functional  MR from dilated annulus.  Will reassess on repeat echo in 6 wks.   Followup with PA/NP in 3 wks.   Followup with me in 6 wks with echo.   Marca Ancona 02/18/2017

## 2017-02-20 ENCOUNTER — Encounter (HOSPITAL_COMMUNITY): Payer: Self-pay | Admitting: *Deleted

## 2017-02-21 ENCOUNTER — Telehealth (HOSPITAL_COMMUNITY): Payer: Self-pay | Admitting: *Deleted

## 2017-02-21 NOTE — Telephone Encounter (Signed)
Work letter faxed to [phone]

## 2017-03-07 ENCOUNTER — Ambulatory Visit (INDEPENDENT_AMBULATORY_CARE_PROVIDER_SITE_OTHER): Admitting: Cardiology

## 2017-03-07 ENCOUNTER — Encounter: Payer: Self-pay | Admitting: Cardiology

## 2017-03-07 VITALS — BP 91/58 | HR 72 | Ht 76.0 in | Wt 252.0 lb

## 2017-03-07 DIAGNOSIS — I5022 Chronic systolic (congestive) heart failure: Secondary | ICD-10-CM | POA: Diagnosis not present

## 2017-03-07 DIAGNOSIS — I4891 Unspecified atrial fibrillation: Secondary | ICD-10-CM | POA: Diagnosis not present

## 2017-03-07 DIAGNOSIS — I2510 Atherosclerotic heart disease of native coronary artery without angina pectoris: Secondary | ICD-10-CM

## 2017-03-07 NOTE — Progress Notes (Signed)
Clinical Summary Kevin Oconnell is a 61 y.o.male seen today for follow up of the following medical problems.   1.PAF - admit to [facility] with new onset afib with RVR - converted back to NSR during admission. Discharged on oral dilt - never had palpitations. No symptoms since discharge - wasnot started on anticoag at time of hospitalization.  - episode of afib with RVR at time of presentation for outpatient cath. Admitted and started on IV amiodarone - converted to NSR on amio, though did have short runs of afib and NSVT. Started on low dose Toprol XL  - no recent palpitations - no bleeding troubles on eliquis   2. Acute systolic HF - echo 11/2016 at [facility] shows LVEF <20%, severe global hypokinesis, severe MR, mild TR,  - 12/2016 cath 80% prox to mid LAD, LCX 100%, RCA 30%. RHC: mean PA 21, PCWP 16, CI 1.5 - recent discharge weight 255 lbs.  - soft bp's have limited medical therapy,have not started ACE-I/ARB - we recently changed his lasix to 20mg  prn due to uptrend I Cr.    - last visit we started lisinopril 2.5mg  daily. At CHF appt started on aldactone 12.5mg  daily.  - occasional lightheadness but tolerable.    3. CAD - 2 vessel CAD as reported above - medical management at this time. Goal to optimize CHF management and afib control over the next few months, consider PCI at that time.   - no recent chest pain since last visit   4. 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 Has not gone back to work since his CHF diagnosis.     Past Medical History:  Diagnosis Date  . Allergy   . Arthritis   . Asthma   . CHF (congestive heart failure) (HCC)   . Heart murmur      No Known Allergies   Current Outpatient Prescriptions  Medication Sig Dispense Refill  . amiodarone (PACERONE) 200 MG tablet Take 1 tablet (200 mg total) by mouth daily. TAKE 2 TABLETS DAILY FOR 2 WEEKS THEN TAKE 1 TABLET DAILY 01/04/17 90 tablet  1  . apixaban (ELIQUIS) 5 MG TABS tablet Take 1 tablet (5 mg total) by mouth 2 (two) times daily. 180 tablet 3  . atorvastatin (LIPITOR) 80 MG tablet Take 1 tablet (80 mg total) by mouth daily at 6 PM. 90 tablet 1  . Cholecalciferol (VITAMIN D) 2000 units tablet Take 2,000 Units by mouth daily.    . DULoxetine (CYMBALTA) 60 MG capsule Take 1 capsule (60 mg total) by mouth daily. 90 capsule 3  . furosemide (LASIX) 20 MG tablet Take 20 mg by mouth daily as needed. SWELLING OR WEIGHT GAIN OF 3LBS    . lisinopril (PRINIVIL,ZESTRIL) 2.5 MG tablet Take 1 tablet (2.5 mg total) by mouth daily. 90 tablet 1  . metoprolol succinate (TOPROL XL) 25 MG 24 hr tablet Take 1 tablet (25 mg total) by mouth 2 (two) times daily. 180 tablet 3  . Multiple Vitamin (MULTIVITAMIN WITH MINERALS) TABS tablet Take 1 tablet by mouth daily.    . Omega-3 Fatty Acids (FISH OIL) 1000 MG CAPS Take 1,000 mg by mouth every other day.     . potassium chloride (K-DUR,KLOR-CON) 10 MEQ tablet TAKE ONLY ON DAYS WHEN TAKING LASIX 90 tablet 3  . spironolactone (ALDACTONE) 25 MG tablet Take 0.5 tablets (12.5 mg total) by mouth daily. 45 tablet 3  . tamsulosin (FLOMAX) 0.4  MG CAPS capsule Take 1 capsule (0.4 mg total) by mouth daily. 90 capsule 3  . vitamin C (ASCORBIC ACID) 500 MG tablet Take 500 mg by mouth daily.    Marland Kitchen zolpidem (AMBIEN) 10 MG tablet Take 1 tablet (10 mg total) by mouth at bedtime as needed for sleep. 15 tablet 2   No current facility-administered medications for this visit.      Past Surgical History:  Procedure Laterality Date  . RIGHT/LEFT HEART CATH AND CORONARY ANGIOGRAPHY N/A 12/14/2016   Procedure: Right/Left Heart Cath and Coronary Angiography;  Surgeon: Peter M Swaziland, MD;  Location: [facility] INVASIVE CV LAB;  Service: Cardiovascular;  Laterality: N/A;  . SHOULDER SURGERY Right      No Known Allergies    Family History  Problem Relation Age of Onset  . Heart disease Mother   . Diabetes Mother   . Stroke  Father      Social History Kevin Oconnell reports that he quit smoking about 7 years ago. His smoking use included Cigarettes. He started smoking about 41 years ago. He has a 70.00 pack-year smoking history. He has never used smokeless tobacco. Kevin Oconnell reports that he does not drink alcohol.   Review of Systems CONSTITUTIONAL: No weight loss, fever, chills, weakness or fatigue.  HEENT: Eyes: No visual loss, blurred vision, double vision or yellow sclerae.No hearing loss, sneezing, congestion, runny nose or sore throat.  SKIN: No rash or itching.  CARDIOVASCULAR: per hpi RESPIRATORY: No shortness of breath, cough or sputum.  GASTROINTESTINAL: No anorexia, nausea, vomiting or diarrhea. No abdominal pain or blood.  GENITOURINARY: No burning on urination, no polyuria NEUROLOGICAL: No headache, dizziness, syncope, paralysis, ataxia, numbness or tingling in the extremities. No change in bowel or bladder control.  MUSCULOSKELETAL: No muscle, back pain, joint pain or stiffness.  LYMPHATICS: No enlarged nodes. No history of splenectomy.  PSYCHIATRIC: No history of depression or anxiety.  ENDOCRINOLOGIC: No reports of sweating, cold or heat intolerance. No polyuria or polydipsia.  Marland Kitchen   Physical Examination Vitals:   03/07/17 1411  BP: (!) 91/58  Pulse: 72   Vitals:   03/07/17 1411  Weight: 252 lb (114.3 kg)  Height: 6\' 4"  (1.93 m)    Gen: resting comfortably, no acute distress HEENT: no scleral icterus, pupils equal round and reactive, no palptable cervical adenopathy,  CV: RRR, no m/r/g, no jvd Resp: Clear to auscultation bilaterally GI: abdomen is soft, non-tender, non-distended, normal bowel sounds, no hepatosplenomegaly MSK: extremities are warm, no edema.  Skin: warm, no rash Neuro:  no focal deficits Psych: appropriate affect   Diagnostic Studies     Assessment and Plan   1. Chronic systolic HF - no current symptoms - soft bp's limits medical therapy. No changes  today - f/u with CHF clinic, repeat echo ordered  2. PAF - new diagnosis during recent admission. No palpitations, unclear duration.  - on oral amio. EKG in clinic today shows NSR - CHADS2Vasc score is 2, he will continue anticoag  3. CAD - continue medical therapy - we may consider  revasc in future depending LVEF response to medical therapy and control of his afib.         [name], M.D

## 2017-03-07 NOTE — Patient Instructions (Signed)
Medication Instructions:  Continue all current medications.  Labwork:  BMET - order given today.  Office will contact with results via phone or letter.    Testing/Procedures: none  Follow-Up: As needed   Any Other Special Instructions Will Be Listed Below (If Applicable).  If you need a refill on your cardiac medications before your next appointment, please call your pharmacy.

## 2017-03-11 ENCOUNTER — Encounter (HOSPITAL_BASED_OUTPATIENT_CLINIC_OR_DEPARTMENT_OTHER)

## 2017-03-13 ENCOUNTER — Ambulatory Visit (HOSPITAL_COMMUNITY)
Admission: RE | Admit: 2017-03-13 | Discharge: 2017-03-13 | Disposition: A | Discharge status: Home / Self Care | Source: Ambulatory Visit | Attending: Cardiology | Admitting: Cardiology

## 2017-03-13 ENCOUNTER — Encounter (HOSPITAL_COMMUNITY): Payer: Self-pay

## 2017-03-13 VITALS — BP 92/56 | HR 71 | Wt 252.4 lb

## 2017-03-13 DIAGNOSIS — R5382 Chronic fatigue, unspecified: Secondary | ICD-10-CM

## 2017-03-13 DIAGNOSIS — G4733 Obstructive sleep apnea (adult) (pediatric): Secondary | ICD-10-CM | POA: Insufficient documentation

## 2017-03-13 DIAGNOSIS — I5022 Chronic systolic (congestive) heart failure: Secondary | ICD-10-CM | POA: Diagnosis not present

## 2017-03-13 DIAGNOSIS — I480 Paroxysmal atrial fibrillation: Secondary | ICD-10-CM | POA: Diagnosis not present

## 2017-03-13 DIAGNOSIS — I429 Cardiomyopathy, unspecified: Secondary | ICD-10-CM | POA: Insufficient documentation

## 2017-03-13 DIAGNOSIS — Z8249 Family history of ischemic heart disease and other diseases of the circulatory system: Secondary | ICD-10-CM | POA: Insufficient documentation

## 2017-03-13 DIAGNOSIS — Z7902 Long term (current) use of antithrombotics/antiplatelets: Secondary | ICD-10-CM | POA: Insufficient documentation

## 2017-03-13 DIAGNOSIS — I2510 Atherosclerotic heart disease of native coronary artery without angina pectoris: Secondary | ICD-10-CM

## 2017-03-13 DIAGNOSIS — I340 Nonrheumatic mitral (valve) insufficiency: Secondary | ICD-10-CM | POA: Diagnosis not present

## 2017-03-13 DIAGNOSIS — Z87891 Personal history of nicotine dependence: Secondary | ICD-10-CM | POA: Diagnosis not present

## 2017-03-13 LAB — BASIC METABOLIC PANEL
Anion gap: 6 (ref 5–15)
BUN: 17 mg/dL (ref 6–20)
CHLORIDE: 107 mmol/L (ref 101–111)
CO2: 26 mmol/L (ref 22–32)
CREATININE: 1.25 mg/dL — AB (ref 0.61–1.24)
Calcium: 9.4 mg/dL (ref 8.9–10.3)
GFR calc Af Amer: >60 mL/min (ref >60)
GFR calc non Af Amer: >60 mL/min (ref >60)
Glucose, Bld: 122 mg/dL — ABNORMAL HIGH (ref 65–99)
Potassium: 4.2 mmol/L (ref 3.5–5.1)
SODIUM: 139 mmol/L (ref 135–145)

## 2017-03-13 NOTE — Progress Notes (Signed)
PCP: Dr. Ermalinda Memos Cardiology: Dr. Wyline Mood HF Cardiology: Dr. Shirlee Latch  61 yo with history of paroxysmal atrial fibrillation, CAD, and chronic systolic CHF was referred by Dr. Wyline Mood for CHF clinic evaluation.    Patient had no cardiac history prior to 2/18.  In 2/18, he developed the onset of exertional dyspnea. After steadily worsening dyspnea, he went to the ER at [facility] and was admitted.  Echo in 2/18 showed EF < 20% with severe MR.  He went back into NSR spontaneously.  He followed up with Dr. Wyline Mood and was set up for a cath in 3/18.  He was found to have a chronically occluded LCx and a long proximal to mid LAD stenosis.  No intervention was done.  He was noted to be back in rapid atrial fibrillation and was admitted.  He was diuresed and started on amiodarone, he converted back to NSR again spontaneously.   He presents today for follow up. Low dose spiro added at last visit.  Feels OK.  Remains fatigued at times. Lightheadedness with rapid standing. Having occasional palpitations. Only taking lasix about once a week. 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  Denies SOB walking around school. Takes his time. Mild DOE with stairs. No orthopnea or bendopnea. SBP 100-110s at home.    Labs (4/18): K 4.5, creatinine 1.0  PMH: 1. Atrial fibrillation: Paroxysmal.  2. 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  - Echo (2/18) with mild LV dilation, EF < 20%, severe mitral regurgitation.  - RHC/LHC (3/18): long 80% proximal-mid LAD stenosis, total occlusion of the LCx with left to left collaterals, RCA ok. Mean RA 7, PA 32/10, mean PCWP 16, LVEDP 12, CI 1.54.  3. CAD: LHC (3/18) with long 80% proximal-mid LAD stenosis, total occlusion of the LCx with left to left collaterals, RCA ok. 4. Mitral regurgitation: Severe on 2/18 echo, ?functional.   SH: Bus driver and school custodian, lives in [place], quit  smoking in 2011, married.   FH: Grandfather with MIs, mother with CABG and CHF.   Review of systems complete and found to be negative unless listed in HPI.    Current Outpatient Prescriptions  Medication Sig Dispense Refill  . amiodarone (PACERONE) 200 MG tablet Take 200 mg by mouth daily.    Marland Kitchen apixaban (ELIQUIS) 5 MG TABS tablet Take 1 tablet (5 mg total) by mouth 2 (two) times daily. 180 tablet 3  . atorvastatin (LIPITOR) 80 MG tablet Take 1 tablet (80 mg total) by mouth daily at 6 PM. 90 tablet 1  . Cholecalciferol (VITAMIN D) 2000 units tablet Take 2,000 Units by mouth daily.    . DULoxetine (CYMBALTA) 60 MG capsule Take 1 capsule (60 mg total) by mouth daily. 90 capsule 3  . furosemide (LASIX) 20 MG tablet Take 20 mg by mouth daily as needed. SWELLING OR WEIGHT GAIN OF 3LBS    . lisinopril (PRINIVIL,ZESTRIL) 2.5 MG tablet Take 1 tablet (2.5 mg total) by mouth daily. 90 tablet 1  . metoprolol succinate (TOPROL XL) 25 MG 24 hr tablet Take 1 tablet (25 mg total) by mouth 2 (two) times daily. 180 tablet 3  . Multiple Vitamin (MULTIVITAMIN WITH MINERALS) TABS tablet Take 1 tablet by mouth daily.    . Omega-3 Fatty Acids (FISH OIL) 1000 MG CAPS Take 1,000 mg by mouth every other day.     . potassium chloride (K-DUR,KLOR-CON) 10 MEQ tablet TAKE ONLY ON DAYS WHEN TAKING  LASIX 90 tablet 3  . spironolactone (ALDACTONE) 25 MG tablet Take 0.5 tablets (12.5 mg total) by mouth daily. 45 tablet 3  . tamsulosin (FLOMAX) 0.4 MG CAPS capsule Take 1 capsule (0.4 mg total) by mouth daily. 90 capsule 3  . vitamin C (ASCORBIC ACID) 500 MG tablet Take 500 mg by mouth daily.    Marland Kitchen zolpidem (AMBIEN) 10 MG tablet Take 1 tablet (10 mg total) by mouth at bedtime as needed for sleep. 15 tablet 2   No current facility-administered medications for this encounter.    BP (!) 92/56 (BP Location: Right Arm, Patient Position: Sitting, Cuff Size: Normal)   Pulse 71   Wt 252 lb 6.4 oz (114.5 kg)   SpO2 95%   BMI 30.72  kg/m    Wt Readings from Last 3 Encounters:  03/13/17 252 lb 6.4 oz (114.5 kg)  03/07/17 252 lb (114.3 kg)  02/16/17 250 lb (113.4 kg)    General: Well appearing. No resp difficulty. HEENT: Normal Neck: Thick. JVP 6-7. Carotids 2+ bilat; no bruits. No thyromegaly or nodule noted. Cor: PMI nondisplaced. RRR, 1/6 HSM apex.  Lungs: CTAB, normal effort. Abdomen: soft, non-tender, distended, no HSM. No bruits or masses. +BS  Extremities: no cyanosis, clubbing, rash, R and LLE no edema.  Neuro: alert & orientedx3, cranial nerves grossly intact. moves all 4 extremities w/o difficulty. Affect pleasant   G Assessment/Plan: 1. Atrial fibrillation: Paroxysmal.  He had atrial fibrillation with RVR in 2/18 and again in 3/18.  He is in NSR today on amiodarone.  - ? if tachycardia-mediated cardiomyopathy may play a role in his LV dysfunction.  - Continue amiodarone for now.  - Recent LFTS and TSH stable. Needs yearly eye exams. - Continue Eliquis. Denies bleeding.  - Think that he eventually would benefit from atrial fibrillation ablation ([facility]-HF data).  2. 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  Echo in 2/18 with EF < 20%.  On exam today, he is euvolemic.  NYHA class II symptoms.  He appears to do much better in NSR.   - Maintaining NSR.  - Continue Toprol XL and lisinopril at current doses.  BP soft.  - Continue spironolactone 12.5 mg daily.  BMET today. No room to up-titrate.  - Continue to use Lasix as needed.  - Repeat echo at next visit with Dr. Shirlee Latch, will need ICD if EF < 35%.   QRS is not wide, not a CRT candidate.  3. CAD: 3/18 cath with chronically occluded LCx with collaterals and long up to 80% proximal to mid LAD stenosis.  No chest pain.  No intervention in 3/18, not ACS.   - Can stop ASA given Eliquis use.  - Continue atorvastatin.  - It would be reasonable to consider full revascularization given low EF.  Will need  to send him back to Dr. Swaziland when he is medically stabilized for evaluation for LAD PCI and CTO procedure to LCx.   - [facility] re[facility] next month.  - Will await repeat Echo.  4. OSA: Patient snores and has daytime sleepiness.   - Planned for sleep study 03/28/17 Treatment of sleep apnea if present may decrease recurrence of atrial fibrillation. 5. Mitral regurgitation: Severe on [place] echo.  May be functional MR from dilated annulus.  Will reassess on repeat echo as planned.   BMET today. Keep appt with Dr. Shirlee Latch with Echo. Stable symptomatically, BP soft.   Graciella Freer, PA-C  03/13/2017

## 2017-03-13 NOTE — Patient Instructions (Signed)
Labs today We will only contact you if something comes back abnormal or we need to make some changes. Otherwise no news is good news!   Your physician recommends that you schedule a follow-up appointment as scheduled with Dr Shirlee Latch  Do the following things EVERYDAY: 1) Weigh yourself in the morning before breakfast. Write it down and keep it in a log. 2) Take your medicines as prescribed 3) Eat low salt foods-Limit salt (sodium) to 2000 mg per day.  4) Stay as active as you can everyday 5) Limit all fluids for the day to less than 2 liters

## 2017-03-28 ENCOUNTER — Ambulatory Visit (HOSPITAL_BASED_OUTPATIENT_CLINIC_OR_DEPARTMENT_OTHER): Attending: Cardiology | Admitting: Cardiology

## 2017-03-28 DIAGNOSIS — I10 Essential (primary) hypertension: Secondary | ICD-10-CM | POA: Diagnosis not present

## 2017-03-28 DIAGNOSIS — G4736 Sleep related hypoventilation in conditions classified elsewhere: Secondary | ICD-10-CM | POA: Diagnosis present

## 2017-03-28 DIAGNOSIS — I5022 Chronic systolic (congestive) heart failure: Secondary | ICD-10-CM

## 2017-03-28 DIAGNOSIS — I493 Ventricular premature depolarization: Secondary | ICD-10-CM | POA: Insufficient documentation

## 2017-03-28 DIAGNOSIS — I509 Heart failure, unspecified: Secondary | ICD-10-CM | POA: Diagnosis not present

## 2017-03-28 DIAGNOSIS — G4734 Idiopathic sleep related nonobstructive alveolar hypoventilation: Secondary | ICD-10-CM | POA: Diagnosis not present

## 2017-03-28 DIAGNOSIS — R0902 Hypoxemia: Secondary | ICD-10-CM | POA: Diagnosis not present

## 2017-03-28 DIAGNOSIS — I471 Supraventricular tachycardia: Secondary | ICD-10-CM | POA: Insufficient documentation

## 2017-03-29 NOTE — Procedures (Signed)
   Patient Name: Kevin Oconnell, Kevin Oconnell Date: 03/28/2017 Gender: Male D.O.B: Mar 06, 1956 Age (years): 60 Referring Provider: Marca Ancona Height (inches): 76 Interpreting Physician: Armanda Magic MD, ABSM Weight (lbs): 246 RPSGT: Ulyess Mort BMI: 30 MRN: [id number] Neck Size: 18.00  CLINICAL INFORMATION Sleep Study Type: NPSG  Indication for sleep study: Congestive Heart Failure, Excessive Daytime Sleepiness, Fatigue, Hypertension  Epworth Sleepiness Score: 3  SLEEP STUDY TECHNIQUE As per the AASM Manual for the Scoring of Sleep and Associated Events v2.3 (April 2016) with a hypopnea requiring 4% desaturations.  The channels recorded and monitored were frontal, central and occipital EEG, electrooculogram (EOG), submentalis EMG (chin), nasal and oral airflow, thoracic and abdominal wall motion, anterior tibialis EMG, snore microphone, electrocardiogram, and pulse oximetry.  MEDICATIONS Medications self-administered by patient taken the night of the study : N/A  SLEEP ARCHITECTURE The study was initiated at 9:39:20 PM and ended at 4:30:50 AM.  Sleep onset time was 21.9 minutes and the sleep efficiency was 75.0%. The total sleep time was 308.5 minutes.  Stage REM latency was 179.5 minutes.  The patient spent 20.26% of the night in stage N1 sleep, 65.96% in stage N2 sleep, 0.00% in stage N3 and 13.78% in REM.  Alpha intrusion was absent.  Supine sleep was 2.76%.  RESPIRATORY PARAMETERS The overall apnea/hypopnea index (AHI) was 4.5 per hour. There were 5 total apneas, including 4 obstructive, 1 central and 0 mixed apneas. There were 18 hypopneas and 15 RERAs.  The AHI during Stage REM sleep was 11.3 per hour.  AHI while supine was 0.0 per hour.  The mean oxygen saturation was 91.35%. The minimum SpO2 during sleep was 85.00%.  Soft snoring was noted during this study.  CARDIAC DATA The 2 lead EKG demonstrated sinus rhythm. The mean heart rate was 54.28 beats per  minute. Other EKG findings include: PVCs and nonsustained atrial tachycardia.  LEG MOVEMENT DATA The total PLMS were 0 with a resulting PLMS index of 0.00. Associated arousal with leg movement index was 0.0 .  IMPRESSIONS - No significant obstructive sleep apnea occurred during this study (AHI = 4.5/h). - No significant central sleep apnea occurred during this study (CAI = 0.2/h). - Mild oxygen desaturation was noted during this study (Min O2 = 85.00%). - The patient snored with Soft snoring volume. - EKG findings include PVCs and nonsustained atrial tachycardia. - Clinically significant periodic limb movements did not occur during sleep. No significant associated arousals.  DIAGNOSIS - Nocturnal Hypoxemia (327.26 [G47.36 ICD-10]) - Nonsustained atrial tachycardia - PVCs  RECOMMENDATIONS - Avoid alcohol, sedatives and other CNS depressants that may worsen sleep apnea and disrupt normal sleep architecture. - Sleep hygiene should be reviewed to assess factors that may improve sleep quality. - Weight management and regular exercise should be initiated or continued if appropriate. - Patient will need Oxygen titration in sleep lab starting at 2L.  Armanda Magic Diplomate, American Board of Sleep Medicine  ELECTRONICALLY SIGNED ON:  03/29/2017, 3:17 PM [facility] PH: [phone]   FX: [phone] ACCREDITED BY THE AMERICAN ACADEMY OF SLEEP MEDICINE

## 2017-03-30 ENCOUNTER — Telehealth: Payer: Self-pay | Admitting: *Deleted

## 2017-03-30 DIAGNOSIS — G4734 Idiopathic sleep related nonobstructive alveolar hypoventilation: Secondary | ICD-10-CM

## 2017-03-30 NOTE — Telephone Encounter (Deleted)
-----   Message from Traci R Turner, MD sent at 03/29/2017  3:20 PM EDT ----- Please let patient know that sleep study showed no significant sleep apnea but does have nocturnal hypoxemia.  Please set up in lab oxygen titration starting at 2L 

## 2017-03-30 NOTE — Telephone Encounter (Signed)
-----   Message from Traci R Turner, MD sent at 03/29/2017  3:20 PM EDT ----- Please let patient know that sleep study showed no significant sleep apnea but does have nocturnal hypoxemia.  Please set up in lab oxygen titration starting at 2L 

## 2017-03-30 NOTE — Telephone Encounter (Addendum)
Informed patient of sleep study results and patient understanding was verbalized. Patient understands his oxygen drops at night. Patient states his EF is down to 15% and is probably contributing to the problem.  Patient informed me he has CHF and he is having an ECHO on the June 20th and then he will see the surgeon,. On the 28th of June he starts cardiac rehab. Patient wants to wait and talk to his cardiologist ( Dr Shirlee Latch) before doing this titration. Patient says he will have the test done if his doctor gives the ok.

## 2017-03-30 NOTE — Telephone Encounter (Deleted)
-----   Message from Quintella Reichert, MD sent at 03/29/2017  3:20 PM EDT ----- Please let patient know that sleep study showed no significant sleep apnea but does have nocturnal hypoxemia.  Please set up in lab oxygen titration starting at 2L

## 2017-04-04 ENCOUNTER — Ambulatory Visit (HOSPITAL_BASED_OUTPATIENT_CLINIC_OR_DEPARTMENT_OTHER)
Admission: RE | Admit: 2017-04-04 | Discharge: 2017-04-04 | Disposition: A | Discharge status: Home / Self Care | Source: Ambulatory Visit | Attending: Cardiology | Admitting: Cardiology

## 2017-04-04 ENCOUNTER — Ambulatory Visit (HOSPITAL_COMMUNITY)
Admission: RE | Admit: 2017-04-04 | Discharge: 2017-04-04 | Disposition: A | Discharge status: Home / Self Care | Source: Ambulatory Visit | Attending: Family Medicine | Admitting: Family Medicine

## 2017-04-04 ENCOUNTER — Encounter (HOSPITAL_COMMUNITY)

## 2017-04-04 ENCOUNTER — Encounter (HOSPITAL_COMMUNITY): Payer: Self-pay

## 2017-04-04 VITALS — BP 92/61 | HR 71 | Wt 251.2 lb

## 2017-04-04 DIAGNOSIS — I4819 Other persistent atrial fibrillation: Secondary | ICD-10-CM

## 2017-04-04 DIAGNOSIS — Z8249 Family history of ischemic heart disease and other diseases of the circulatory system: Secondary | ICD-10-CM | POA: Insufficient documentation

## 2017-04-04 DIAGNOSIS — I481 Persistent atrial fibrillation: Secondary | ICD-10-CM | POA: Diagnosis not present

## 2017-04-04 DIAGNOSIS — I5022 Chronic systolic (congestive) heart failure: Secondary | ICD-10-CM | POA: Diagnosis not present

## 2017-04-04 DIAGNOSIS — I480 Paroxysmal atrial fibrillation: Secondary | ICD-10-CM

## 2017-04-04 DIAGNOSIS — I2510 Atherosclerotic heart disease of native coronary artery without angina pectoris: Secondary | ICD-10-CM | POA: Insufficient documentation

## 2017-04-04 DIAGNOSIS — G4736 Sleep related hypoventilation in conditions classified elsewhere: Secondary | ICD-10-CM | POA: Diagnosis not present

## 2017-04-04 DIAGNOSIS — Z7901 Long term (current) use of anticoagulants: Secondary | ICD-10-CM | POA: Insufficient documentation

## 2017-04-04 DIAGNOSIS — Z79899 Other long term (current) drug therapy: Secondary | ICD-10-CM | POA: Diagnosis not present

## 2017-04-04 DIAGNOSIS — I340 Nonrheumatic mitral (valve) insufficiency: Secondary | ICD-10-CM | POA: Diagnosis not present

## 2017-04-04 DIAGNOSIS — Z87891 Personal history of nicotine dependence: Secondary | ICD-10-CM | POA: Diagnosis not present

## 2017-04-04 LAB — LIPID PANEL
Cholesterol: 73 mg/dL (ref 0–200)
HDL: 28 mg/dL — AB (ref >40)
LDL Cholesterol: 14 mg/dL (ref 0–99)
TRIGLYCERIDES: 154 mg/dL — AB (ref <150)
Total CHOL/HDL Ratio: 2.6 RATIO
VLDL: 31 mg/dL (ref 0–40)

## 2017-04-04 LAB — BASIC METABOLIC PANEL
ANION GAP: 8 (ref 5–15)
BUN: 17 mg/dL (ref 6–20)
CALCIUM: 9.2 mg/dL (ref 8.9–10.3)
CO2: 25 mmol/L (ref 22–32)
Chloride: 107 mmol/L (ref 101–111)
Creatinine, Ser: 1.27 mg/dL — ABNORMAL HIGH (ref 0.61–1.24)
Glucose, Bld: 127 mg/dL — ABNORMAL HIGH (ref 65–99)
POTASSIUM: 3.8 mmol/L (ref 3.5–5.1)
Sodium: 140 mmol/L (ref 135–145)

## 2017-04-04 NOTE — Progress Notes (Signed)
PCP: Dr. Ermalinda Memos Cardiology: Dr. Wyline Mood HF Cardiology: Dr. Shirlee Latch  61 yo with history of paroxysmal atrial fibrillation, CAD, and chronic systolic CHF was referred by Dr. Wyline Mood for CHF clinic evaluation.    Patient had no cardiac history prior to 2/18.  In 2/18, he developed the onset of exertional dyspnea. After steadily worsening dyspnea, he went to the ER at [facility] and was admitted.  Echo in 2/18 showed EF < 20% with severe MR.  He went back into NSR spontaneously.  He followed up with Dr. Wyline Mood and was set up for a cath in 3/18.  He was found to have a chronically occluded LCx and a long proximal to mid LAD stenosis.  No intervention was done.  He was noted to be back in rapid atrial fibrillation and was admitted.  He was diuresed and started on amiodarone, he converted back to NSR again spontaneously. He is in NSR today.   Today he returns for HF follow up. Overall feeling ok. He has a sleep study on 6/13 with desaturations at night. Recommended to start oxygen at night. Admits to SOB when he is outside lifting. Denies orthopnea/pnd. Denies syncope.  Weight at home 246 pounds. He has had lasix about 3 times in the last month. Denies SOB. No bleeding problems. Taking all medications. 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  Plans to start cardiac rehab next week.   Echo was done today, EF 30-35% with diffuse hypokinesis and only trivial mitral regurgitation.   Labs (4/18): K 4.5, creatinine 1.0 Labs (5/18): K 4.2, creatinine 1.25, LFTs normal, TSH normal  PMH: 1. Atrial fibrillation: Paroxysmal.  2. 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  - Echo (2/18) with mild LV dilation, EF < 20%, severe mitral regurgitation.  - RHC/LHC (3/18): long 80% proximal-mid LAD stenosis, total occlusion of the LCx with left to left collaterals, RCA ok. Mean RA 7, PA 32/10, mean  PCWP 16, LVEDP 12, CI 1.54.  - Echo (6/18) with EF 30-35%, diffuse hypokinesis, normal RV size and systolic function, trivial MR.  3. CAD: LHC (3/18) with long 80% proximal-mid LAD stenosis, total occlusion of the LCx with left to left collaterals, RCA ok. 4. Mitral regurgitation: Severe on 2/18 echo.  Suspect functional, as MR only trivial on 6/18 echo.  5. Sleep study (6/18) with no OSA but nocturnal hypoxemia.   SH: Midwife and school custodian, lives in [place], quit smoking in 2011, married.   FH: Grandfather with MIs, mother with CABG and CHF.   Review of systems complete and found to be negative unless listed in HPI.    Current Outpatient Prescriptions  Medication Sig Dispense Refill  . amiodarone (PACERONE) 200 MG tablet Take 200 mg by mouth daily.    Marland Kitchen apixaban (ELIQUIS) 5 MG TABS tablet Take 1 tablet (5 mg total) by mouth 2 (two) times daily. 180 tablet 3  . atorvastatin (LIPITOR) 80 MG tablet Take 1 tablet (80 mg total) by mouth daily at 6 PM. 90 tablet 1  . Cholecalciferol (VITAMIN D) 2000 units tablet Take 2,000 Units by mouth daily.    . DULoxetine (CYMBALTA) 60 MG capsule Take 1 capsule (60 mg total) by mouth daily. 90 capsule 3  . lisinopril (PRINIVIL,ZESTRIL) 2.5 MG tablet Take 1 tablet (2.5 mg total) by mouth daily. 90 tablet 1  . metoprolol succinate (TOPROL XL) 25 MG 24 hr tablet Take  1 tablet (25 mg total) by mouth 2 (two) times daily. 180 tablet 3  . Multiple Vitamin (MULTIVITAMIN WITH MINERALS) TABS tablet Take 1 tablet by mouth daily.    . Omega-3 Fatty Acids (FISH OIL) 1000 MG CAPS Take 1,000 mg by mouth every other day.     . spironolactone (ALDACTONE) 25 MG tablet Take 0.5 tablets (12.5 mg total) by mouth daily. 45 tablet 3  . tamsulosin (FLOMAX) 0.4 MG CAPS capsule Take 1 capsule (0.4 mg total) by mouth daily. 90 capsule 3  . vitamin C (ASCORBIC ACID) 500 MG tablet Take 500 mg by mouth daily.    Marland Kitchen zolpidem (AMBIEN) 10 MG tablet Take 1 tablet (10 mg total)  by mouth at bedtime as needed for sleep. 15 tablet 2  . furosemide (LASIX) 20 MG tablet Take 20 mg by mouth daily as needed. SWELLING OR WEIGHT GAIN OF 3LBS    . potassium chloride (K-DUR,KLOR-CON) 10 MEQ tablet TAKE ONLY ON DAYS WHEN TAKING LASIX (Patient not taking: Reported on 04/04/2017) 90 tablet 3   No current facility-administered medications for this encounter.    BP 92/61   Pulse 71   Wt 251 lb 4 oz (114 kg)   SpO2 98%   BMI 30.58 kg/m    Wt Readings from Last 3 Encounters:  04/04/17 251 lb 4 oz (114 kg)  03/28/17 246 lb (111.6 kg)  03/13/17 252 lb 6.4 oz (114.5 kg)    General: Well appearing. No resp difficulty. HEENT: Normal Neck: Thick. JVP 6-7. Carotids 2+ bilat; no bruits. No thyromegaly or nodule noted. Cor: PMI nondisplaced. RRR, 1/6 HSM apex.  Lungs: CTAB, normal effort. Abdomen: soft, non-tender, distended, no HSM. No bruits or masses. +BS  Extremities: no cyanosis, clubbing, rash, R and LLE no edema.  Neuro: alert & orientedx3, cranial nerves grossly intact. moves all 4 extremities w/o difficulty. Affect pleasant   Assessment/Plan: 1. Atrial fibrillation: Paroxysmal.  He had atrial fibrillation with RVR in 2/18 and again in 3/18.  He is in NSR today on amiodarone. EF is improved a bit on today's echo, suspect tachycardia-mediated cardiomyopathy played a role in his LV dysfunction (but was not the only cause).  - Continue amiodarone for now. LFTS and TSH ok recently. He understands he will need yearly eye exams.  - Continue Eliquis. Denies bleeding.  - Think that he eventually would benefit from atrial fibrillation ablation (CASTLE-HF data), we will refer to Dr. Johney Frame or Dr. Elberta Fortis for evaluation.  I would like him to come off amiodarone eventually.  2. 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  Echo in 2/18 with EF < 20%. Today's was discussed and reviewed Dr Shirlee Latch. EF 30-35%.  NYHA class II,  symptomatically doing quite well. Volume status stable. Continue lasix as needed (takes rarely).  BP too soft for medication titration.  - Continue Toprol XL and lisinopril at current doses.   - Continue spironolactone 12.5 mg daily.    - Though EF is still low, would not plan ICD yet.  I think that he would benefit from revascularization, so would do this first and then reassess EF.   3. CAD: 3/18 cath with chronically occluded LCx with collaterals and long up to 80% proximal to mid LAD stenosis.  No chest pain.  No intervention in 3/18, not ACS.  No chest pain.  - Starting cardiac rehab in the end of June.  - No ASA given Eliquis use.  - Continue atorvastatin, check lipids today.  -  EF remains low on today's echo.  No akinetic/thinned segments on echo, so think he would be unlikely to have non-viable territory. Refer to Dr. Swaziland for LAD PCI and CTO procedure to LCx, he should be stable at this point for procedure.   4. Sleep study showed nocturnal hypoxemia but no OSA. Will arrange for oxygen while sleeping.  5. Mitral regurgitation: Severe on [place] echo.  Likely functional as today's echo has only trivial MR.   Follow up 6 wks.   Tonye Becket, NP  04/04/2017   Patient seen with NP, agree with the above note.  I have independently talked with the patient and performed the exam.  The plan above reflects my thoughts.    He appears euvolemic on exam with NYHA class II symptoms.  Echo today shows EF 30-35% with diffuse hypokinesis and only trivial MR now.  He has had some, but not complete, improvement.  I suspect he had a mixed tachycardia-mediated and ischemic cardiomypoathy.  He has stayed in NSR.    No BP room to titrate meds.  Can continue to use Lasix prn.   I think that he would benefit from atrial fibrillation ablation based on CASTLE-HF data.  I would like him to be ablated and then stop amiodarone down the road.  I will refer him to EP.   On today's echo, no thinned/akinetic wall  segments, so I doubt that there is nonviable territory.  EF remains low. He has CTO of LCx and 80% proximal-mid LAD stenosis.  I think that it would be reasonable at this point to fully revascularize him. I will refer to Dr. Swaziland for evaluation.  Marca Ancona 04/04/2017

## 2017-04-04 NOTE — Progress Notes (Signed)
  Echocardiogram 2D Echocardiogram has been performed.  Kevin Oconnell 04/04/2017, 11:48 AM

## 2017-04-04 NOTE — Patient Instructions (Signed)
Labs today  You have been referred to Dr Swaziland  You have been referred to EP for a-fib ablation  We have asked Dr Norris Cross office to follow up with you about oxygen for night time use.  Your physician recommends that you schedule a follow-up appointment in: 6-8 weeks

## 2017-04-06 ENCOUNTER — Telehealth (HOSPITAL_COMMUNITY): Payer: Self-pay | Admitting: *Deleted

## 2017-04-06 NOTE — Telephone Encounter (Signed)
Lipid panel  Order: [id number]  Status:  Final result Visible to patient:  Yes (MyChart) Dx:  Chronic systolic CHF (congestive hear...  Notes recorded by Suezanne Cheshire, RN on 04/06/2017 at 2:02 PM EDT Called and spoke with patient, he is aware and no further questions. ------  Notes recorded by Laurey Morale, MD on 04/04/2017 at 4:54 PM EDT Very good LDL

## 2017-04-06 NOTE — Telephone Encounter (Signed)
Pt's wife aware.

## 2017-04-06 NOTE — Telephone Encounter (Signed)
Pt's wife called back stating dentist office needs note from [place].  Note faxed to Dr Daisy Lazar at [phone], pt's wife aware

## 2017-04-06 NOTE — Telephone Encounter (Signed)
Pt's wife called, she states pt has a bad tooth that is really bothering him.  She states this has been going on for a couple of months and it is getting worse.  She said the dentist told them the tooth needs to be pulled but Dr Wyline Mood told them he didn't think that was a good idea due to his heart condition so nothing was done. She states tooth is getting worse and pt is having to take tylenol all day to help with pain.  She would like to know if Dr Shirlee Latch feels he is now ok to have tooth pulled.  Will send to him for review

## 2017-04-06 NOTE — Telephone Encounter (Signed)
He can have the tooth pulled.

## 2017-04-09 ENCOUNTER — Ambulatory Visit (INDEPENDENT_AMBULATORY_CARE_PROVIDER_SITE_OTHER): Admitting: Cardiology

## 2017-04-09 ENCOUNTER — Encounter: Payer: Self-pay | Admitting: Cardiology

## 2017-04-09 VITALS — BP 102/70 | HR 74 | Ht 76.0 in | Wt 251.8 lb

## 2017-04-09 DIAGNOSIS — I480 Paroxysmal atrial fibrillation: Secondary | ICD-10-CM

## 2017-04-09 DIAGNOSIS — I428 Other cardiomyopathies: Secondary | ICD-10-CM | POA: Diagnosis not present

## 2017-04-09 DIAGNOSIS — I255 Ischemic cardiomyopathy: Secondary | ICD-10-CM | POA: Diagnosis not present

## 2017-04-09 DIAGNOSIS — I2510 Atherosclerotic heart disease of native coronary artery without angina pectoris: Secondary | ICD-10-CM | POA: Diagnosis not present

## 2017-04-09 NOTE — Patient Instructions (Signed)
Your physician recommends that you continue on your current medications as directed. Please refer to the Current Medication list given to you today. Cardiac Ablation Cardiac ablation is a procedure to stop some heart tissue from causing problems. The heart has many electrical connections. Sometimes these connections make the heart beat very fast or irregularly. Removing some problem areas can improve the heart rhythm or make it normal. What happens before the procedure?  Follow instructions from your doctor about what you cannot eat or drink.  Ask your doctor about: ? Changing or stopping your normal medicines. This is important if you take diabetes medicines or blood thinners. ? Taking medicines such as aspirin and ibuprofen. These medicines can thin your blood. Do not take these medicines before your procedure if your doctor tells you not to.  Plan to have someone take you home.  If you will be going home right after the procedure, plan to have someone with you for 24 hours. What happens during the procedure?  To lower your risk of infection: ? Your health care team will wash or sanitize their hands. ? Your skin will be washed with soap. ? Hair may be removed from your neck or groin.  An IV tube will be put into one of your veins.  You will be given a medicine to help you relax (sedative).  Skin on your neck or groin will be numbed.  A cut (incision) will be made in your neck or groin.  A needle will be put through your cut and into a vein in your neck or groin.  A tube (catheter) will be put into the needle. The tube will be moved to your heart. X-rays (fluoroscopy) will be used to help guide the tube.  Small devices (electrodes) on the tip of the tube will send out electrical currents.  Dye may be put through the tube. This helps your surgeon see your heart.  Electrical energy will be used to scar (ablate) some heart tissue. Your surgeon may use: ? Heat (radiofrequency  energy). ? Laser energy. ? Extreme cold (cryoablation).  The tube will be taken out.  Pressure will be held on your cut. This helps stop bleeding.  A bandage (dressing) will be put on your cut. The procedure may vary. What happens after the procedure?  You will be monitored until your medicines have worn off.  Your cut will be watched for bleeding. You will need to lie still for a few hours.  Do not drive for 24 hours or as long as your doctor tells you. Summary  Cardiac ablation is a procedure to stop some heart tissue from causing problems.  Electrical energy will be used to scar (ablate) some heart tissue. This information is not intended to replace advice given to you by your health care provider. Make sure you discuss any questions you have with your health care provider. Document Released: 06/04/2013 Document Revised: 08/21/2016 Document Reviewed: 08/21/2016 Elsevier Interactive Patient Education  2017 [employer].

## 2017-04-09 NOTE — Progress Notes (Signed)
Electrophysiology Office Note   Date:  04/09/2017   ID:  Kevin Oconnell, DOB Feb 19, 1956, MRN [id number]  PCP:  Elenora Gamma, MD  Cardiologist:  Naida Sleight Primary Electrophysiologist:  Regan Lemming, MD    Chief Complaint  Patient presents with  . Advice Only    Afib/discuss ablation     History of Present Illness: Kevin Oconnell is a 61 y.o. male who is being seen today for the evaluation of atrial fibrillation at the request of Elenora Gamma, MD. Presenting today for electrophysiology evaluation. He has a history of paroxysmal atrial fibrillation, coronary artery disease, chronic systolic heart failure. Prior to February 2018, he was doing well. He steadily became more short of breath and went to the emergency room at [facility] and was found to have an EF of less than 20% with severe mitral regurgitation. Cath in 2018 showed a chronically occluded circumflex and in LAD stenosis with no intervention performed. He was back in rapid atrial fibrillation and was admitted. His diuresis started on amiodarone and converted to sinus rhythm spontaneously. Repeat echo was performed that showed an EF of 30-35%, diffuse hypokinesis, and trivial mitral regurgitation.    Today, he denies symptoms of palpitations, chest pain, shortness of breath, orthopnea, PND, lower extremity edema, claudication, dizziness, presyncope, syncope, bleeding, or neurologic sequela. The patient is tolerating medications without difficulties.    Past Medical History:  Diagnosis Date  . Allergy   . Arthritis   . Asthma   . CHF (congestive heart failure) (HCC)   . Heart murmur    Past Surgical History:  Procedure Laterality Date  . RIGHT/LEFT HEART CATH AND CORONARY ANGIOGRAPHY N/A 12/14/2016   Procedure: Right/Left Heart Cath and Coronary Angiography;  Surgeon: Peter M Swaziland, MD;  [facility] INVASIVE CV LAB;  Service: Cardiovascular;  Laterality: N/A;  . SHOULDER SURGERY Right       Current Outpatient Prescriptions  Medication Sig Dispense Refill  . amiodarone (PACERONE) 200 MG tablet Take 200 mg by mouth daily.    Marland Kitchen apixaban (ELIQUIS) 5 MG TABS tablet Take 1 tablet (5 mg total) by mouth 2 (two) times daily. 180 tablet 3  . atorvastatin (LIPITOR) 80 MG tablet Take 1 tablet (80 mg total) by mouth daily at 6 PM. 90 tablet 1  . DULoxetine (CYMBALTA) 60 MG capsule Take 1 capsule (60 mg total) by mouth daily. 90 capsule 3  . furosemide (LASIX) 20 MG tablet Take 20 mg by mouth daily as needed. SWELLING OR WEIGHT GAIN OF 3LBS    . lisinopril (PRINIVIL,ZESTRIL) 2.5 MG tablet Take 1 tablet (2.5 mg total) by mouth daily. 90 tablet 1  . metoprolol succinate (TOPROL XL) 25 MG 24 hr tablet Take 1 tablet (25 mg total) by mouth 2 (two) times daily. 180 tablet 3  . Omega-3 Fatty Acids (FISH OIL) 1000 MG CAPS Take 1,000 mg by mouth every other day.     . potassium chloride (K-DUR,KLOR-CON) 10 MEQ tablet TAKE ONLY ON DAYS WHEN TAKING LASIX 90 tablet 3  . spironolactone (ALDACTONE) 25 MG tablet Take 0.5 tablets (12.5 mg total) by mouth daily. 45 tablet 3  . tamsulosin (FLOMAX) 0.4 MG CAPS capsule Take 1 capsule (0.4 mg total) by mouth daily. 90 capsule 3  . zolpidem (AMBIEN) 10 MG tablet Take 10 mg by mouth at bedtime as needed for sleep.     No current facility-administered medications for this visit.     Allergies:   Patient has no  known allergies.   Social History:  The patient  reports that he quit smoking about 7 years ago. His smoking use included Cigarettes. He started smoking about 41 years ago. He has a 70.00 pack-year smoking history. He has never used smokeless tobacco. He reports that he does not drink alcohol or use drugs.   Family History:  The patient's family history includes Diabetes in his mother; Heart disease in his mother; Stroke in his father.    ROS:  Please see the history of present illness.   Otherwise, review of systems is positive for Difficulty  urinating, back pain, muscle pain.   All other systems are reviewed and negative.    PHYSICAL EXAM: VS:  BP 102/70   Pulse 74   Ht 6\' 4"  (1.93 m)   Wt 251 lb 12.8 oz (114.2 kg)   BMI 30.65 kg/m  , BMI Body mass index is 30.65 kg/m. GEN: Well nourished, well developed, in no acute distress  HEENT: normal  Neck: no JVD, carotid bruits, or masses Cardiac: RRR; no murmurs, rubs, or gallops,no edema  Respiratory:  clear to auscultation bilaterally, normal work of breathing GI: soft, nontender, nondistended, + BS MS: no deformity or atrophy  Skin: warm and dry Neuro:  Strength and sensation are intact Psych: euthymic mood, full affect  EKG:  EKG is ordered today. Personal review of the ekg ordered 02/15/17 shows sinus rhythm, PVCs  Recent Labs: 02/16/2017: ALT 45; B Natriuretic Peptide 262.9; Hemoglobin 14.8; Platelets 280; TSH 2.058 04/04/2017: BUN 17; Creatinine, Ser 1.27; Potassium 3.8; Sodium 140    Lipid Panel     Component Value Date/Time   CHOL 73 04/04/2017 1604   CHOL 171 04/25/2016 1055   TRIG 154 (H) 04/04/2017 1604   HDL 28 (L) 04/04/2017 1604   HDL 32 (L) 04/25/2016 1055   CHOLHDL 2.6 04/04/2017 1604   VLDL 31 04/04/2017 1604   LDLCALC 14 04/04/2017 1604   LDLCALC 106 (H) 04/25/2016 1055     Wt Readings from Last 3 Encounters:  04/09/17 251 lb 12.8 oz (114.2 kg)  04/04/17 251 lb 4 oz (114 kg)  03/28/17 246 lb (111.6 kg)      Other studies Reviewed: Additional studies/ records that were reviewed today include: TTE 04/04/17 Review of the above records today demonstrates:  - Left ventricle: The cavity size was mildly dilated. Wall   thickness was normal. Systolic function was moderately to   severely reduced. The estimated ejection fraction was in the   range of 30% to 35%. Diffuse hypokinesis. GLS -14.1%. Doppler   parameters are consistent with abnormal left ventricular   relaxation (grade 1 diastolic dysfunction). - Aortic valve: There was no stenosis. -  Aorta: Mildly dilated aortic root. Aortic root dimension: 39 mm   (ED). - Mitral valve: There was trivial regurgitation. - Left atrium: The atrium was mildly dilated. - Right ventricle: The cavity size was normal. Systolic function   was normal. - Right atrium: The atrium was mildly dilated. - Tricuspid valve: Peak RV-RA gradient (S): 22 mm Hg. - Pulmonary arteries: PA peak pressure: 25 mm Hg (S). - Inferior vena cava: The vessel was normal in size. The   respirophasic diameter changes were in the normal range (>= 50%),   consistent with normal central venous pressure.   Cath 12/14/16  Mid RCA lesion, 35 %stenosed.  Mid RCA to Dist RCA lesion, 30 %stenosed.  Prox LAD to Mid LAD lesion, 80 %stenosed.  Mid LAD lesion, 70 %stenosed.  Ost Cx to Prox Cx lesion, 100 %stenosed.  There is severe left ventricular systolic dysfunction.  LV end diastolic pressure is normal.  The left ventricular ejection fraction is less than 25% by visual estimate.   1. 2 vessel obstructive CAD    - long segmental mid LAD disease 70-80%    - 100% proximal LCx - CTO with left to left collaterals. 2. Severe LV dysfunction with EF 15% 3. Normal right heart and LV filling pressures 4. Cardiac output 3.8 L/min with index 1.54.  ASSESSMENT AND PLAN:  1.  Paroxysmal atrial fibrillation: Currently on amiodarone and Eliquis. Converted to sinus rhythm with amiodarone initiation. Discussed therapies including medical management and ablation. Risks and benefits of ablation were discussed. Risks include bleeding, tamponade, heart block, stroke, and damage to surrounding organs among others. He understands these risks and has agreed to ablation.  2. Chronic systolic heart failure: Suspect a mixed ischemic and nonischemic in etiology due to his rapid atrial fibrillation. EF is improved to 30-35% with only mild mitral regurgitation. Currently on Toprol-XL, lisinopril, and Aldactone. May benefit from an ICD in the  future if his EF remains low.   3. Coronary artery disease: On 3/18 showed an occluded circumflex with significant LAD Stenosis. No intervention was performed. Plan to start cardiac rehabilitation.    Current medicines are reviewed at length with the patient today.   The patient does not have concerns regarding his medicines.  The following changes were made today:  none  Labs/ tests ordered today include:  No orders of the defined types were placed in this encounter.    Disposition:   FU with Kerly Rigsbee 4 months  Signed, Geraline Halberstadt Jorja Loa, MD  04/09/2017 3:18 PM     [facility] [address] [place] [address] [phone] (office) [phone] (fax)

## 2017-04-10 NOTE — Telephone Encounter (Signed)
-----   Message from Quintella Reichert, MD sent at 04/07/2017  7:23 PM EDT ----- Regarding: RE: night time oxygen Please order O2 at 2L [place] for use during sleep for nocturnal hypoxemia and get an overnight pulse ox on 2L [place]  Traci Turner ----- Message ----- From: Reesa Chew, CMA Sent: 04/06/2017  10:28 AM To: Quintella Reichert, MD Subject: night time oxygen                              Hello Dr Mayford Knife, Per [facility] patient needs  1.[employer] [facility] oxygen order in home on 2 L  with nasal canula for nocturnal hypoxemia and CHF.  2. ONO order  on 2L  ----- Message ----- From: Quintella Reichert, MD Sent: 04/04/2017   4:25 PM To: Reesa Chew, CMA  Please check with [facility] but this patient likely will need an O2 titration in the lab for insurance to pay. Please find out  Traci

## 2017-04-11 ENCOUNTER — Telehealth: Payer: Self-pay | Admitting: *Deleted

## 2017-04-11 NOTE — Telephone Encounter (Signed)
Informed patient we will hold off on scheduling RFA until after CTO procedure.  Pt understands I will follow back up once procedure has been completed.  He is agreeable to plan.

## 2017-04-11 NOTE — Telephone Encounter (Signed)
-----   Message from Will Jorja Loa, MD sent at 04/10/2017  9:38 AM EDT ----- Can we have this guy follow up after his CTO procedure with Peter Swaziland. Do not schedule ablation until then. Thanks! ----- Message ----- From: Laurey Morale, MD Sent: 04/10/2017  12:01 AM To: Regan Lemming, MD  Maybe go let him have his CTO procedure 1st, I have him seeing Dr. Swaziland to set him up for this.   ----- Message ----- From: Regan Lemming, MD Sent: 04/09/2017   3:20 PM To: Laurey Morale, MD  Happy to ablate this guy. He says that there is a chance of cath with intervention on his LAD and circumflex. Is there an order that you would like to do things? Anesthesia may get nervous with his severe CAD during the AF ablation, though he seems well compensated.

## 2017-04-11 NOTE — Telephone Encounter (Deleted)
-----   Message from Traci R Turner, MD sent at 04/07/2017  7:23 PM EDT ----- Regarding: RE: night time oxygen Please order O2 at 2L [place] for use during sleep for nocturnal hypoxemia and get an overnight pulse ox on 2L [place]  Traci Turner ----- Message ----- From: Tavi Hoogendoorn G, CMA Sent: 04/06/2017  10:28 AM To: Traci R Turner, MD Subject: night time oxygen                              Hello Dr Turner, Per [facility] patient needs  1.[facility] oxygen order in home on 2 L  with nasal canula for nocturnal hypoxemia and CHF.  2. ONO order  on 2L  ----- Message ----- From: Turner, Traci R, MD Sent: 04/04/2017   4:25 PM To: Normagene Harvie G Makye Radle, CMA  Please check with [facility] but this patient likely will need an O2 titration in the lab for insurance to pay. Please find out  Traci   

## 2017-04-12 ENCOUNTER — Encounter (HOSPITAL_COMMUNITY): Admission: RE | Admit: 2017-04-12 | Source: Ambulatory Visit

## 2017-04-12 NOTE — Telephone Encounter (Signed)
Per [facility] does not take the patients insurance.  Noralyn Pick, CMA        [facility] isn't in network with the [employer]. We cannot accept this referral. Sorry!!!!    Spoke to Jacquiline Doe) who informed me [facility] takes his ([employer]) insurance. Office notes, demographics, ONO order and Oxygen order sent  has been sent to [facility].

## 2017-04-25 ENCOUNTER — Ambulatory Visit: Admitting: Family Medicine

## 2017-04-26 ENCOUNTER — Ambulatory Visit: Admitting: Family Medicine

## 2017-04-28 NOTE — Progress Notes (Signed)
Cardiology Office Note   Date:  05/02/2017   ID:  Kevin Oconnell, DOB Sep 29, 1956, MRN [id number]  PCP:  Elenora Gamma, MD  Cardiologist:  Dina Rich MD CHF: Marca Ancona MD EP: Loman Brooklyn MD  Chief Complaint  Patient presents with  . Coronary Artery Disease  . Congestive Heart Failure      History of Present Illness: Kevin Oconnell is a 62 y.o. male who presents at the request of Dr. Shirlee Latch for consideration for coronary revascularization. He presented in February 2018 with progressive dyspnea. Hhe went to the ER at [facility] and was admitted. He was in Afib with RVR.  Echo in 2/18 showed EF < 20% with severe MR.  He went back into NSR spontaneously.  He followed up with Dr. Wyline Mood and was set up for a cath in 3/18.  He was found to have a chronically occluded LCx and a long proximal to mid LAD stenosis of 70%.  No intervention was done.  He was noted to be back in rapid atrial fibrillation and was admitted.  He was diuresed and started on amiodarone, he converted back to NSR again spontaneously. He has since maintained NSR. Followed by Dr. Shirlee Latch in CHF clinic and medications optimized. Echo repeated on June 20 and EF 30-35% with trivial MR. Global hypokinesis with no thinned or akinetic areas. It was felt that his LV function may improve further with revascularization. Afib ablation also being considered for his Afib based on CASTLE HF trial but it was felt this should await consideration of revascularization.   On evaluation today he reports he is doing OK. Notes he gains about 3 lbs during the day but then weight is down by the following morning. Using lasix only prn now. Does note some dyspnea when out working in the heat. 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 Notes HR has been Ok. Denies any chest pain/pressure.     Past Medical History:  Diagnosis Date  . Allergy   . Arthritis   . Asthma   . CHF (congestive heart failure) (HCC)   . Heart murmur       Past Surgical History:  Procedure Laterality Date  . RIGHT/LEFT HEART CATH AND CORONARY ANGIOGRAPHY N/A 12/14/2016   Procedure: Right/Left Heart Cath and Coronary Angiography;  Surgeon: Lounell Schumacher M [place], MD;  Location: [facility] INVASIVE CV LAB;  Service: Cardiovascular;  Laterality: N/A;  . SHOULDER SURGERY Right      Current Outpatient Prescriptions  Medication Sig Dispense Refill  . amiodarone (PACERONE) 200 MG tablet Take 200 mg by mouth daily.    Marland Kitchen apixaban (ELIQUIS) 5 MG TABS tablet Take 1 tablet (5 mg total) by mouth 2 (two) times daily. 180 tablet 3  . atorvastatin (LIPITOR) 80 MG tablet Take 1 tablet (80 mg total) by mouth daily at 6 PM. 90 tablet 1  . DULoxetine (CYMBALTA) 60 MG capsule Take 1 capsule (60 mg total) by mouth daily. 90 capsule 3  . furosemide (LASIX) 20 MG tablet Take 20 mg by mouth daily as needed. SWELLING OR WEIGHT GAIN OF 3LBS    . lisinopril (PRINIVIL,ZESTRIL) 2.5 MG tablet Take 1 tablet (2.5 mg total) by mouth daily. 90 tablet 1  . metoprolol succinate (TOPROL XL) 25 MG 24 hr tablet Take 1 tablet (25 mg total) by mouth 2 (two) times daily. 180 tablet 3  . Omega-3 Fatty Acids (FISH OIL) 1000 MG CAPS Take 1,000 mg by mouth every other day.     Marland Kitchen  potassium chloride (K-DUR,KLOR-CON) 10 MEQ tablet TAKE ONLY ON DAYS WHEN TAKING LASIX 90 tablet 3  . spironolactone (ALDACTONE) 25 MG tablet Take 0.5 tablets (12.5 mg total) by mouth daily. 45 tablet 3  . tamsulosin (FLOMAX) 0.4 MG CAPS capsule Take 1 capsule (0.4 mg total) by mouth daily. 90 capsule 3  . zolpidem (AMBIEN) 10 MG tablet Take 10 mg by mouth at bedtime as needed for sleep.     No current facility-administered medications for this visit.     Allergies:   Patient has no known allergies.    Social History:  The patient  reports that he quit smoking about 7 years ago. His smoking use included Cigarettes. He started smoking about 41 years ago. He has a 70.00 pack-year smoking history. He has never used smokeless  tobacco. He reports that he does not drink alcohol or use drugs.   Family History:  The patient's family history includes Diabetes in his mother; Heart disease in his mother; Stroke in his father.    ROS:  Please see the history of present illness. Recent eye exam consistent with mild amiodarone changes.   Otherwise, review of systems are positive for none.   All other systems are reviewed and negative.    PHYSICAL EXAM: VS:  BP 100/70   Pulse 68   Ht 6\' 4"  (1.93 m)   Wt 252 lb (114.3 kg)   BMI 30.67 kg/m  , BMI Body mass index is 30.67 kg/m. GEN: Well nourished, well developed, in no acute distress  HEENT: normal  Neck: no JVD, carotid bruits, or masses Cardiac: RRR; no murmurs, rubs, or gallops,no edema  Respiratory:  clear to auscultation bilaterally, normal work of breathing GI: soft, nontender, nondistended, + BS MS: no deformity or atrophy  Skin: warm and dry, no rash Neuro:  Strength and sensation are intact Psych: euthymic mood, full affect   EKG:  EKG is not ordered today. The ekg ordered today demonstrates N/A   Recent Labs: 02/16/2017: ALT 45; B Natriuretic Peptide 262.9; Hemoglobin 14.8; Platelets 280; TSH 2.058 04/04/2017: BUN 17; Creatinine, Ser 1.27; Potassium 3.8; Sodium 140    Lipid Panel    Component Value Date/Time   CHOL 73 04/04/2017 1604   CHOL 171 04/25/2016 1055   TRIG 154 (H) 04/04/2017 1604   HDL 28 (L) 04/04/2017 1604   HDL 32 (L) 04/25/2016 1055   CHOLHDL 2.6 04/04/2017 1604   VLDL 31 04/04/2017 1604   LDLCALC 14 04/04/2017 1604   LDLCALC 106 (H) 04/25/2016 1055      Wt Readings from Last 3 Encounters:  05/02/17 252 lb (114.3 kg)  04/09/17 251 lb 12.8 oz (114.2 kg)  04/04/17 251 lb 4 oz (114 kg)      Other studies Reviewed: Cardiac cath 12/14/16: Procedures   Right/Left Heart Cath and Coronary Angiography  Conclusion     Mid RCA lesion, 35 %stenosed.  Mid RCA to Dist RCA lesion, 30 %stenosed.  Prox LAD to Mid LAD lesion,  80 %stenosed.  Mid LAD lesion, 70 %stenosed.  Ost Cx to Prox Cx lesion, 100 %stenosed.  There is severe left ventricular systolic dysfunction.  LV end diastolic pressure is normal.  The left ventricular ejection fraction is less than 25% by visual estimate.   1. 2 vessel obstructive CAD    - long segmental mid LAD disease 70-80%    - 100% proximal LCx - CTO with left to left collaterals. 2. Severe LV dysfunction with EF 15% 3. Normal  right heart and LV filling pressures 4. Cardiac output 3.8 L/min with index 1.54.  Plan: patient in Afib today with RVR. Rate 130 at rest. Given severe cardiomyopathy I recommend admission for rate control and initiation of amiodarone. Will anticoagulate with IV heparin. Consider transition to oral anticoagulation. If does not convert on amiodarone would consider DCCV early next week. I suspect his arrhythmia is contributing to his LV dysfunction. Consider options for revascularization once arrhythmia under control. The LAD could be stented relatively easily. Could consider CTO PCI of the LCx in the future.    Indications   Acute systolic CHF (congestive heart failure) (HCC) [I50.21 (ICD-10-CM)]  Procedural Details/Technique   Technical Details Indication: 61 yo WM with recent admission with Afib with RVR and new onset cardiomyopathy. EF 25%. Converted to NSR. Referred for cardiac cath.  Procedural Details: The right wrist was prepped, draped, and anesthetized with 1% lidocaine. Using the modified Seldinger technique a 6 Fr slender sheath was placed in the right radial artery and a 5 French sheath was placed in the right brachial vein. A Swan-Ganz catheter was used for the right heart catheterization. Standard protocol was followed for recording of right heart pressures and sampling of oxygen saturations. Fick cardiac output was calculated. Standard Judkins catheters were used for selective coronary angiography and left ventriculography. There were no  immediate procedural complications. The patient was transferred to the post catheterization recovery area for further monitoring.  Contrast: 115 cc   Estimated blood loss <50 mL.  During this procedure no sedation was administered.    Coronary Findings   Dominance: Right  Left Main  Vessel was injected. Vessel is normal in caliber. Vessel is angiographically normal.  Left Anterior Descending  Prox LAD to Mid LAD lesion, 80% stenosed. The lesion is segmental.  Mid LAD lesion, 70% stenosed.  Left Circumflex  Ost Cx to Prox Cx lesion, 100% stenosed. The lesion is chronically occluded with right-to-left and left-to-left collateral flow.  Lateral Second Obtuse Marginal Branch  Lat 2nd Mrg filled by collaterals from 2nd Sept.  Right Coronary Artery  Mid RCA lesion, 35% stenosed.  Mid RCA to Dist RCA lesion, 30% stenosed.  Right Heart   Right Heart Pressures Normal right heart pressures.    Wall Motion              Left Heart   Left Ventricle The left ventricle is severely dilated. There is severe left ventricular systolic dysfunction. LV end diastolic pressure is normal. The left ventricular ejection fraction is less than 25% by visual estimate. There are LV function abnormalities due to global hypokinesis.    Coronary Diagrams   Diagnostic Diagram       Implants     No implant documentation for this case.  PACS Images   Show images for Cardiac catheterization   Link to Procedure Log   Procedure Log    Hemo Data    Most Recent Value  Fick Cardiac Output 3.82 L/min  Fick Cardiac Output Index 1.54 (L/min)/BSA  RA A Wave 8 mmHg  RA V Wave 8 mmHg  RA Mean 7 mmHg  RV Systolic Pressure 28 mmHg  RV Diastolic Pressure 2 mmHg  RV EDP 5 mmHg  PA Systolic Pressure 32 mmHg  PA Diastolic Pressure 10 mmHg  PA Mean 21 mmHg  PW A Wave 18 mmHg  PW V Wave 15 mmHg  PW Mean 16 mmHg  AO Systolic Pressure 93 mmHg  AO Diastolic Pressure 68 mmHg  AO  Mean 78 mmHg  LV  Systolic Pressure 82 mmHg  LV Diastolic Pressure 8 mmHg  LV EDP 12 mmHg  Arterial Occlusion Pressure Extended Systolic Pressure 91 mmHg  Arterial Occlusion Pressure Extended Diastolic Pressure 58 mmHg  Arterial Occlusion Pressure Extended Mean Pressure 70 mmHg  Left Ventricular Apex Extended Systolic Pressure 84 mmHg  Left Ventricular Apex Extended Diastolic Pressure 9 mmHg  Left Ventricular Apex Extended EDP Pressure 14 mmHg  QP/QS 1  TPVR Index 13.61 HRUI  TSVR Index 47.34 HRUI  PVR SVR Ratio 0.08  TPVR/TSVR Ratio 0.29   Echo 04/04/17: Study Conclusions  - Left ventricle: The cavity size was mildly dilated. Wall   thickness was normal. Systolic function was moderately to   severely reduced. The estimated ejection fraction was in the   range of 30% to 35%. Diffuse hypokinesis. GLS -14.1%. Doppler   parameters are consistent with abnormal left ventricular   relaxation (grade 1 diastolic dysfunction). - Aortic valve: There was no stenosis. - Aorta: Mildly dilated aortic root. Aortic root dimension: 39 mm   (ED). - Mitral valve: There was trivial regurgitation. - Left atrium: The atrium was mildly dilated. - Right ventricle: The cavity size was normal. Systolic function   was normal. - Right atrium: The atrium was mildly dilated. - Tricuspid valve: Peak RV-RA gradient (S): 22 mm Hg. - Pulmonary arteries: PA peak pressure: 25 mm Hg (S). - Inferior vena cava: The vessel was normal in size. The   respirophasic diameter changes were in the normal range (>= 50%),   consistent with normal central venous pressure.  Impressions:  - Mildly dilated left ventricle with EF 30-35%, diffuse   hypokinesis. Normal RV size and systolic function. Mild biatrial   enlargement. Trivial mitral regurgitation.   ASSESSMENT AND PLAN:  1.  Ischemic cardiomyopathy with chronic systolic CHF. With optimal medical therapy and control of Afib his EF has improved from less than 20% to 30-35% but is  still significantly impaired. I personally reviewed his Echo and cath findings. I agree that his LV is globally down but no evidence of scar or thinning. He has CTO of the proximal LCx and 75% mid LAD stenosis. I think he would benefit from revascularization to hopefully achieve further improvement in LV function. We discussed CTO PCI of the LCx and stenting of the mid LAD. The procedure and risks were reviewed including but not limited to death, myocardial infarction, stroke, arrythmias, bleeding, transfusion, emergency surgery, dye allergy, perforation, or renal dysfunction. The patient voices understanding and is agreeable to proceed. Will schedule for August 15. Prior to procedure will initiate antiplatelet therapy with ASA 81 mg and Plavix 75 mg daily. Will need to hold Eliquis 48 hours prior to procedure. I would anticipate DAPT for one month after stenting then stopping ASA since he is on Eliquis. Will arrange lab work the week prior to procedure.   2. Atrial fibrillation- currently in NSR on amiodarone. Plan ablation by Dr. Elberta Fortis but I agree that it would be appropriate to pursue revascularization first.   3. Mitral insufficiency- improved.   Current medicines are reviewed at length with the patient today.  The patient does not have concerns regarding medicines.  The following changes have been made:  See above  Labs/ tests ordered today include:   Orders Placed This Encounter  Procedures  . Basic metabolic panel  . CBC w/Diff/Platelet  . PT and PTT     Disposition:   PCI scheduled for August 15.  Signed, Anael Rosch [place], MD  05/02/2017 10:20 AM    [facility] [address] [place], [place], [address] Phone [phone], Fax [phone]

## 2017-05-02 ENCOUNTER — Ambulatory Visit: Admitting: Cardiology

## 2017-05-02 ENCOUNTER — Other Ambulatory Visit: Payer: Self-pay | Admitting: Cardiology

## 2017-05-02 ENCOUNTER — Encounter: Payer: Self-pay | Admitting: Cardiology

## 2017-05-02 ENCOUNTER — Ambulatory Visit (INDEPENDENT_AMBULATORY_CARE_PROVIDER_SITE_OTHER): Admitting: Cardiology

## 2017-05-02 VITALS — BP 100/70 | HR 68 | Ht 76.0 in | Wt 252.0 lb

## 2017-05-02 DIAGNOSIS — I2510 Atherosclerotic heart disease of native coronary artery without angina pectoris: Secondary | ICD-10-CM | POA: Diagnosis not present

## 2017-05-02 DIAGNOSIS — I5022 Chronic systolic (congestive) heart failure: Secondary | ICD-10-CM

## 2017-05-02 DIAGNOSIS — I480 Paroxysmal atrial fibrillation: Secondary | ICD-10-CM

## 2017-05-02 DIAGNOSIS — I255 Ischemic cardiomyopathy: Secondary | ICD-10-CM

## 2017-05-02 MED ORDER — CLOPIDOGREL BISULFATE 75 MG PO TABS: ORAL_TABLET | ORAL | 6 refills | Status: DC

## 2017-05-02 NOTE — Patient Instructions (Signed)
We will schedule you for coronary intervention on August 15. One week prior to your procedure we will check blood work and start taking ASA 81 mg daily and Plavix 75 mg daily  Stop Eliquis 2 days prior to procedure

## 2017-05-23 ENCOUNTER — Other Ambulatory Visit

## 2017-05-24 ENCOUNTER — Other Ambulatory Visit

## 2017-05-25 LAB — CBC WITH DIFFERENTIAL/PLATELET
BASOS ABS: 0 10*3/uL (ref 0.0–0.2)
BASOS: 0 %
EOS (ABSOLUTE): 0.2 10*3/uL (ref 0.0–0.4)
Eos: 3 %
Hematocrit: 40 % (ref 37.5–51.0)
Hemoglobin: 13.5 g/dL (ref 13.0–17.7)
IMMATURE GRANULOCYTES: 1 %
Immature Grans (Abs): 0.1 10*3/uL (ref 0.0–0.1)
Lymphocytes Absolute: 1.6 10*3/uL (ref 0.7–3.1)
Lymphs: 30 %
MCH: 29.8 pg (ref 26.6–33.0)
MCHC: 33.8 g/dL (ref 31.5–35.7)
MCV: 88 fL (ref 79–97)
MONOS ABS: 0.4 10*3/uL (ref 0.1–0.9)
Monocytes: 7 %
NEUTROS PCT: 59 %
Neutrophils Absolute: 3.2 10*3/uL (ref 1.4–7.0)
PLATELETS: 290 10*3/uL (ref 150–379)
RBC: 4.53 x10E6/uL (ref 4.14–5.80)
RDW: 14.8 % (ref 12.3–15.4)
WBC: 5.5 10*3/uL (ref 3.4–10.8)

## 2017-05-25 LAB — BASIC METABOLIC PANEL
BUN/Creatinine Ratio: 21 — ABNORMAL HIGH (ref 9–20)
BUN: 24 mg/dL (ref 6–24)
CALCIUM: 9.5 mg/dL (ref 8.7–10.2)
CHLORIDE: 105 mmol/L (ref 96–106)
CO2: 22 mmol/L (ref 20–29)
CREATININE: 1.13 mg/dL (ref 0.76–1.27)
GFR, EST AFRICAN AMERICAN: 82 mL/min/{1.73_m2} (ref >59)
GFR, EST NON AFRICAN AMERICAN: 71 mL/min/{1.73_m2} (ref >59)
Glucose: 105 mg/dL — ABNORMAL HIGH (ref 65–99)
Potassium: 4.6 mmol/L (ref 3.5–5.2)
Sodium: 142 mmol/L (ref 134–144)

## 2017-05-25 LAB — PT AND PTT
INR: 1 (ref 0.8–1.2)
Prothrombin Time: 10.5 s (ref 9.1–12.0)
aPTT: 27 s (ref 24–33)

## 2017-05-30 ENCOUNTER — Encounter (HOSPITAL_COMMUNITY)
Admission: RE | Disposition: A | Discharge status: Home / Self Care | Payer: Self-pay | Source: Ambulatory Visit | Attending: Cardiology

## 2017-05-30 ENCOUNTER — Ambulatory Visit (HOSPITAL_COMMUNITY)
Admission: RE | Admit: 2017-05-30 | Discharge: 2017-05-31 | Disposition: A | Discharge status: Home / Self Care | Source: Ambulatory Visit | Attending: Cardiology | Admitting: Cardiology

## 2017-05-30 ENCOUNTER — Encounter: Payer: Self-pay | Admitting: Cardiology

## 2017-05-30 DIAGNOSIS — I255 Ischemic cardiomyopathy: Secondary | ICD-10-CM | POA: Diagnosis not present

## 2017-05-30 DIAGNOSIS — I2582 Chronic total occlusion of coronary artery: Secondary | ICD-10-CM | POA: Diagnosis not present

## 2017-05-30 DIAGNOSIS — I2510 Atherosclerotic heart disease of native coronary artery without angina pectoris: Secondary | ICD-10-CM | POA: Insufficient documentation

## 2017-05-30 DIAGNOSIS — I4891 Unspecified atrial fibrillation: Secondary | ICD-10-CM | POA: Diagnosis not present

## 2017-05-30 DIAGNOSIS — Z7901 Long term (current) use of anticoagulants: Secondary | ICD-10-CM | POA: Insufficient documentation

## 2017-05-30 DIAGNOSIS — I5022 Chronic systolic (congestive) heart failure: Secondary | ICD-10-CM | POA: Diagnosis present

## 2017-05-30 DIAGNOSIS — Z955 Presence of coronary angioplasty implant and graft: Secondary | ICD-10-CM

## 2017-05-30 DIAGNOSIS — I340 Nonrheumatic mitral (valve) insufficiency: Secondary | ICD-10-CM | POA: Diagnosis not present

## 2017-05-30 DIAGNOSIS — Z7983 Long term (current) use of bisphosphonates: Secondary | ICD-10-CM | POA: Insufficient documentation

## 2017-05-30 DIAGNOSIS — Z87891 Personal history of nicotine dependence: Secondary | ICD-10-CM | POA: Insufficient documentation

## 2017-05-30 DIAGNOSIS — Z79899 Other long term (current) drug therapy: Secondary | ICD-10-CM | POA: Diagnosis not present

## 2017-05-30 HISTORY — DX: Benign prostatic hyperplasia without lower urinary tract symptoms: N40.0

## 2017-05-30 HISTORY — DX: Headache, unspecified: R51.9

## 2017-05-30 HISTORY — DX: Personal history of other diseases of the digestive system: Z87.19

## 2017-05-30 HISTORY — DX: Cervicalgia: M54.2

## 2017-05-30 HISTORY — DX: Other chronic pain: G89.29

## 2017-05-30 HISTORY — DX: Other seasonal allergic rhinitis: J30.2

## 2017-05-30 HISTORY — DX: Chronic sinusitis, unspecified: J32.9

## 2017-05-30 HISTORY — PX: CORONARY ANGIOPLASTY WITH STENT PLACEMENT: SHX49

## 2017-05-30 HISTORY — DX: Atherosclerotic heart disease of native coronary artery without angina pectoris: I25.10

## 2017-05-30 HISTORY — DX: Personal history of peptic ulcer disease: Z87.11

## 2017-05-30 HISTORY — PX: CORONARY STENT INTERVENTION: CATH118234

## 2017-05-30 HISTORY — DX: Pneumonia, unspecified organism: J18.9

## 2017-05-30 HISTORY — PX: CORONARY CTO INTERVENTION: CATH118236

## 2017-05-30 HISTORY — DX: Headache: R51

## 2017-05-30 LAB — POCT ACTIVATED CLOTTING TIME
ACTIVATED CLOTTING TIME: 279 s
ACTIVATED CLOTTING TIME: 290 s
ACTIVATED CLOTTING TIME: 285 s
ACTIVATED CLOTTING TIME: 191 s
ACTIVATED CLOTTING TIME: 164 s
Activated Clotting Time: 296 seconds
Activated Clotting Time: 235 seconds

## 2017-05-30 SURGERY — CORONARY CTO INTERVENTION
Anesthesia: LOCAL

## 2017-05-30 MED ORDER — IOPAMIDOL (ISOVUE-370) INJECTION 76%
INTRAVENOUS | Status: AC
  Filled 2017-05-30: qty 100

## 2017-05-30 MED ORDER — SODIUM CHLORIDE 0.9% FLUSH: 3.0000 mL | INTRAVENOUS | Status: DC | PRN

## 2017-05-30 MED ORDER — FENTANYL CITRATE (PF) 100 MCG/2ML IJ SOLN
INTRAMUSCULAR | Status: DC | PRN
  Administered 2017-05-30: 25 ug via INTRAVENOUS

## 2017-05-30 MED ORDER — CLOPIDOGREL BISULFATE 75 MG PO TABS
75.0000 mg | ORAL_TABLET | Freq: Every day | ORAL | Status: DC
  Administered 2017-05-31: 75 mg via ORAL
  Filled 2017-05-30: qty 1

## 2017-05-30 MED ORDER — AMIODARONE HCL 200 MG PO TABS
200.0000 mg | ORAL_TABLET | Freq: Every day | ORAL | Status: DC
  Administered 2017-05-30 – 2017-05-31 (×2): 200 mg via ORAL
  Filled 2017-05-30 (×2): qty 1

## 2017-05-30 MED ORDER — LIDOCAINE HCL (PF) 1 % IJ SOLN
INTRAMUSCULAR | Status: AC
  Filled 2017-05-30: qty 30

## 2017-05-30 MED ORDER — SODIUM CHLORIDE 0.9 % IV SOLN
INTRAVENOUS | Status: DC
  Administered 2017-05-30: 10:00:00 via INTRAVENOUS

## 2017-05-30 MED ORDER — IOPAMIDOL (ISOVUE-370) INJECTION 76%
INTRAVENOUS | Status: AC
  Filled 2017-05-30: qty 50

## 2017-05-30 MED ORDER — SODIUM CHLORIDE 0.9 % IV SOLN
INTRAVENOUS | Status: DC | PRN
  Administered 2017-05-30: 250 mL via INTRAVENOUS
  Administered 2017-05-30: 10 mL/h via INTRAVENOUS

## 2017-05-30 MED ORDER — MIDAZOLAM HCL 2 MG/2ML IJ SOLN
INTRAMUSCULAR | Status: DC | PRN
  Administered 2017-05-30: 1 mg via INTRAVENOUS

## 2017-05-30 MED ORDER — SODIUM CHLORIDE 0.9 % IV SOLN: 250.0000 mL | INTRAVENOUS | Status: DC | PRN

## 2017-05-30 MED ORDER — METOPROLOL SUCCINATE ER 25 MG PO TB24
25.0000 mg | ORAL_TABLET | Freq: Two times a day (BID) | ORAL | Status: DC
  Administered 2017-05-30 – 2017-05-31 (×2): 25 mg via ORAL
  Filled 2017-05-30 (×2): qty 1

## 2017-05-30 MED ORDER — OMEGA-3-ACID ETHYL ESTERS 1 G PO CAPS
1.0000 g | ORAL_CAPSULE | Freq: Every day | ORAL | Status: DC
  Filled 2017-05-30: qty 1

## 2017-05-30 MED ORDER — HEPARIN SODIUM (PORCINE) 1000 UNIT/ML IJ SOLN
INTRAMUSCULAR | Status: DC | PRN
  Administered 2017-05-30 (×2): 3000 [IU] via INTRAVENOUS
  Administered 2017-05-30: 10000 [IU] via INTRAVENOUS
  Administered 2017-05-30: 2000 [IU] via INTRAVENOUS

## 2017-05-30 MED ORDER — ASPIRIN 81 MG PO CHEW
81.0000 mg | CHEWABLE_TABLET | Freq: Every day | ORAL | Status: DC
  Administered 2017-05-31: 09:00:00 81 mg via ORAL
  Filled 2017-05-30: qty 1

## 2017-05-30 MED ORDER — NITROGLYCERIN 1 MG/10 ML FOR IR/CATH LAB
INTRA_ARTERIAL | Status: DC | PRN
  Administered 2017-05-30: 200 ug via INTRACORONARY

## 2017-05-30 MED ORDER — ASPIRIN 81 MG PO CHEW: 81.0000 mg | CHEWABLE_TABLET | ORAL | Status: DC

## 2017-05-30 MED ORDER — ANGIOPLASTY BOOK
Freq: Once | Status: DC
  Filled 2017-05-30: qty 1

## 2017-05-30 MED ORDER — LIDOCAINE HCL (PF) 1 % IJ SOLN
INTRAMUSCULAR | Status: DC | PRN
  Administered 2017-05-30 (×2): 12 mL

## 2017-05-30 MED ORDER — HEPARIN SODIUM (PORCINE) 1000 UNIT/ML IJ SOLN
INTRAMUSCULAR | Status: AC
  Filled 2017-05-30: qty 1

## 2017-05-30 MED ORDER — HEPARIN (PORCINE) IN NACL 2-0.9 UNIT/ML-% IJ SOLN
INTRAMUSCULAR | Status: AC
  Filled 2017-05-30: qty 1000

## 2017-05-30 MED ORDER — IOPAMIDOL (ISOVUE-370) INJECTION 76%
INTRAVENOUS | Status: AC
  Filled 2017-05-30: qty 125

## 2017-05-30 MED ORDER — SODIUM CHLORIDE 0.9% FLUSH
3.0000 mL | INTRAVENOUS | Status: DC | PRN
Start: 2017-05-30 — End: 2017-05-30

## 2017-05-30 MED ORDER — SODIUM CHLORIDE 0.9 % WEIGHT BASED INFUSION: 1.0000 mL/kg/h | INTRAVENOUS | Status: AC

## 2017-05-30 MED ORDER — SODIUM CHLORIDE 0.9% FLUSH
3.0000 mL | Freq: Two times a day (BID) | INTRAVENOUS | Status: DC
  Administered 2017-05-30: 3 mL via INTRAVENOUS

## 2017-05-30 MED ORDER — CLOPIDOGREL BISULFATE 75 MG PO TABS
ORAL_TABLET | ORAL | Status: AC
  Filled 2017-05-30: qty 1

## 2017-05-30 MED ORDER — TAMSULOSIN HCL 0.4 MG PO CAPS
0.4000 mg | ORAL_CAPSULE | Freq: Every day | ORAL | Status: DC
  Administered 2017-05-30 – 2017-05-31 (×2): 0.4 mg via ORAL
  Filled 2017-05-30 (×2): qty 1

## 2017-05-30 MED ORDER — ACETAMINOPHEN 325 MG PO TABS: 650.0000 mg | ORAL_TABLET | ORAL | Status: DC | PRN

## 2017-05-30 MED ORDER — IOPAMIDOL (ISOVUE-370) INJECTION 76%
INTRAVENOUS | Status: DC | PRN
  Administered 2017-05-30: 195 mL via INTRA_ARTERIAL

## 2017-05-30 MED ORDER — SODIUM CHLORIDE 0.9% FLUSH: 3.0000 mL | Freq: Two times a day (BID) | INTRAVENOUS | Status: DC

## 2017-05-30 MED ORDER — ATORVASTATIN CALCIUM 80 MG PO TABS
80.0000 mg | ORAL_TABLET | Freq: Every day | ORAL | Status: DC
  Administered 2017-05-30: 80 mg via ORAL
  Filled 2017-05-30: qty 1

## 2017-05-30 MED ORDER — MIDAZOLAM HCL 2 MG/2ML IJ SOLN
INTRAMUSCULAR | Status: AC
  Filled 2017-05-30: qty 2

## 2017-05-30 MED ORDER — LISINOPRIL 2.5 MG PO TABS
2.5000 mg | ORAL_TABLET | Freq: Every day | ORAL | Status: DC
  Administered 2017-05-30: 2.5 mg via ORAL
  Filled 2017-05-30 (×2): qty 1

## 2017-05-30 MED ORDER — ZOLPIDEM TARTRATE 5 MG PO TABS
5.0000 mg | ORAL_TABLET | Freq: Every evening | ORAL | Status: DC | PRN
  Administered 2017-05-30: 22:00:00 5 mg via ORAL
  Filled 2017-05-30: qty 1

## 2017-05-30 MED ORDER — NITROGLYCERIN 1 MG/10 ML FOR IR/CATH LAB
INTRA_ARTERIAL | Status: AC
  Filled 2017-05-30: qty 10

## 2017-05-30 MED ORDER — CLOPIDOGREL BISULFATE 300 MG PO TABS
ORAL_TABLET | ORAL | Status: DC | PRN
  Administered 2017-05-30: 75 mg via ORAL

## 2017-05-30 MED ORDER — SPIRONOLACTONE 12.5 MG HALF TABLET
12.5000 mg | ORAL_TABLET | Freq: Every day | ORAL | Status: DC
  Administered 2017-05-30: 19:00:00 12.5 mg via ORAL
  Filled 2017-05-30 (×2): qty 1

## 2017-05-30 MED ORDER — ONDANSETRON HCL 4 MG/2ML IJ SOLN: 4.0000 mg | Freq: Four times a day (QID) | INTRAMUSCULAR | Status: DC | PRN

## 2017-05-30 MED ORDER — DULOXETINE HCL 60 MG PO CPEP
60.0000 mg | ORAL_CAPSULE | Freq: Every day | ORAL | Status: DC
  Administered 2017-05-30: 60 mg via ORAL
  Filled 2017-05-30: qty 1

## 2017-05-30 MED ORDER — FENTANYL CITRATE (PF) 100 MCG/2ML IJ SOLN
INTRAMUSCULAR | Status: AC
  Filled 2017-05-30: qty 2

## 2017-05-30 MED ORDER — HEPARIN (PORCINE) IN NACL 2-0.9 UNIT/ML-% IJ SOLN
INTRAMUSCULAR | Status: AC | PRN
  Administered 2017-05-30: 1500 mL

## 2017-05-30 SURGICAL SUPPLY — 20 items
BALLN SAPPHIRE 2.0X20 (BALLOONS) ×2
BALLN SAPPHIRE ~~LOC~~ 3.5X18 (BALLOONS) ×2 IMPLANT
BALLOON SAPPHIRE 2.0X20 (BALLOONS) ×1 IMPLANT
CATH EXPO 5FR FR4 (CATHETERS) ×2 IMPLANT
CATH MACH1 8FR CLS3.5 (CATHETERS) ×2 IMPLANT
CATH TRAPPER 6-8F (CATHETERS) ×2 IMPLANT
CATH TURNPIKE 135CM (CATHETERS) ×2 IMPLANT
ELECT DEFIB PAD ADLT CADENCE (PAD) ×2 IMPLANT
KIT ENCORE 26 ADVANTAGE (KITS) ×2 IMPLANT
KIT HEART LEFT (KITS) ×4 IMPLANT
PACK CARDIAC CATHETERIZATION (CUSTOM PROCEDURE TRAY) ×2 IMPLANT
SHEATH BRITE TIP 8FR 35CM (SHEATH) ×2 IMPLANT
SHEATH PINNACLE 5F 10CM (SHEATH) ×2 IMPLANT
STENT SYNERGY DES 3.5X32 (Permanent Stent) ×2 IMPLANT
STENT SYNERGY DES 3X38 (Permanent Stent) ×4 IMPLANT
TRANSDUCER W/STOPCOCK (MISCELLANEOUS) ×4 IMPLANT
TUBING CIL FLEX 10 FLL-RA (TUBING) ×4 IMPLANT
VALVE GUARDIAN II ~~LOC~~ HEMO (MISCELLANEOUS) ×2 IMPLANT
WIRE EMERALD 3MM-J .035X150CM (WIRE) ×2 IMPLANT
WIRE FIGHTER CROSSING 190CM (WIRE) ×2 IMPLANT

## 2017-05-30 NOTE — Progress Notes (Signed)
Site area: RFA Site Prior to Removal:  Level 0 Pressure Applied For:45 min Manual:   yes Patient Status During Pull:  stable Post Pull Site:  Level 0 Post Pull Instructions Given:  yes Post Pull Pulses Present:palpable  Dressing Applied:  tegaderm Bedrest begins @ 1545 [phone] Comments:

## 2017-05-30 NOTE — Care Management Note (Signed)
Case Management Note  Patient Details  Name: WICK MOSELY MRN: [id number] Date of Birth: 04-25-1956  Subjective/Objective:  From home, s/p coronary stent intervention, will be on plavix.                   Action/Plan: NCM will follow for dc needs.   Expected Discharge Date:                  Expected Discharge Plan:  Home/Self Care  In-House Referral:     Discharge planning Services  CM Consult  Post Acute Care Choice:    Choice offered to:     DME Arranged:    DME Agency:     HH Arranged:    HH Agency:     Status of Service:  In process, will continue to follow  If discussed at Long Length of Stay Meetings, dates discussed:    Additional Comments:  Leone Haven, RN 05/30/2017, 6:08 PM

## 2017-05-30 NOTE — Interval H&P Note (Signed)
History and Physical Interval Note:  05/30/2017 10:17 AM  Kevin Oconnell  has presented today for surgery, with the diagnosis of cad  The various methods of treatment have been discussed with the patient and family. After consideration of risks, benefits and other options for treatment, the patient has consented to  Procedure(s): Coronary CTO Intervention (N/A) as a surgical intervention .  The patient's history has been reviewed, patient examined, no change in status, stable for surgery.  I have reviewed the patient's chart and labs.  Questions were answered to the patient's satisfaction.   Cath Lab Visit (complete for each Cath Lab visit)  Clinical Evaluation Leading to the Procedure:   ACS: No.  Non-ACS:    Anginal Classification: CCS II  Anti-ischemic medical therapy: Minimal Therapy (1 class of medications)  Non-Invasive Test Results: No non-invasive testing performed  Prior CABG: No previous CABG        Kevin Oconnell 05/30/2017 10:19 AM

## 2017-05-30 NOTE — Progress Notes (Signed)
Site area: LFA Site Prior to Removal:  Level 0 Pressure Applied For:30 min Manual:  yes  Patient Status During Pull: stable  Post Pull Site:  Level 0 Post Pull Instructions Given:  yes Post Pull Pulses Present:palpable  Dressing Applied:  tegaderm Bedrest begins @1545  [id number]  Comments:by Renae Fickle L/canderson

## 2017-05-30 NOTE — H&P (View-Only) (Signed)
Cardiology Office Note   Date:  05/02/2017   ID:  Kevin Oconnell, DOB Sep 29, 1956, MRN [id number]  PCP:  Elenora Gamma, MD  Cardiologist:  Dina Rich MD CHF: Marca Ancona MD EP: Loman Brooklyn MD  Chief Complaint  Patient presents with  . Coronary Artery Disease  . Congestive Heart Failure      History of Present Illness: Kevin Oconnell is a 62 y.o. male who presents at the request of Dr. Shirlee Latch for consideration for coronary revascularization. He presented in February 2018 with progressive dyspnea. Hhe went to the ER at [facility] and was admitted. He was in Afib with RVR.  Echo in 2/18 showed EF < 20% with severe MR.  He went back into NSR spontaneously.  He followed up with Dr. Wyline Mood and was set up for a cath in 3/18.  He was found to have a chronically occluded LCx and a long proximal to mid LAD stenosis of 70%.  No intervention was done.  He was noted to be back in rapid atrial fibrillation and was admitted.  He was diuresed and started on amiodarone, he converted back to NSR again spontaneously. He has since maintained NSR. Followed by Dr. Shirlee Latch in CHF clinic and medications optimized. Echo repeated on June 20 and EF 30-35% with trivial MR. Global hypokinesis with no thinned or akinetic areas. It was felt that his LV function may improve further with revascularization. Afib ablation also being considered for his Afib based on CASTLE HF trial but it was felt this should await consideration of revascularization.   On evaluation today he reports he is doing OK. Notes he gains about 3 lbs during the day but then weight is down by the following morning. Using lasix only prn now. Does note some dyspnea when out working in the heat. 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 Notes HR has been Ok. Denies any chest pain/pressure.     Past Medical History:  Diagnosis Date  . Allergy   . Arthritis   . Asthma   . CHF (congestive heart failure) (HCC)   . Heart murmur       Past Surgical History:  Procedure Laterality Date  . RIGHT/LEFT HEART CATH AND CORONARY ANGIOGRAPHY N/A 12/14/2016   Procedure: Right/Left Heart Cath and Coronary Angiography;  Surgeon: Phat Dalton M [place], MD;  Location: [facility] INVASIVE CV LAB;  Service: Cardiovascular;  Laterality: N/A;  . SHOULDER SURGERY Right      Current Outpatient Prescriptions  Medication Sig Dispense Refill  . amiodarone (PACERONE) 200 MG tablet Take 200 mg by mouth daily.    Marland Kitchen apixaban (ELIQUIS) 5 MG TABS tablet Take 1 tablet (5 mg total) by mouth 2 (two) times daily. 180 tablet 3  . atorvastatin (LIPITOR) 80 MG tablet Take 1 tablet (80 mg total) by mouth daily at 6 PM. 90 tablet 1  . DULoxetine (CYMBALTA) 60 MG capsule Take 1 capsule (60 mg total) by mouth daily. 90 capsule 3  . furosemide (LASIX) 20 MG tablet Take 20 mg by mouth daily as needed. SWELLING OR WEIGHT GAIN OF 3LBS    . lisinopril (PRINIVIL,ZESTRIL) 2.5 MG tablet Take 1 tablet (2.5 mg total) by mouth daily. 90 tablet 1  . metoprolol succinate (TOPROL XL) 25 MG 24 hr tablet Take 1 tablet (25 mg total) by mouth 2 (two) times daily. 180 tablet 3  . Omega-3 Fatty Acids (FISH OIL) 1000 MG CAPS Take 1,000 mg by mouth every other day.     Marland Kitchen  potassium chloride (K-DUR,KLOR-CON) 10 MEQ tablet TAKE ONLY ON DAYS WHEN TAKING LASIX 90 tablet 3  . spironolactone (ALDACTONE) 25 MG tablet Take 0.5 tablets (12.5 mg total) by mouth daily. 45 tablet 3  . tamsulosin (FLOMAX) 0.4 MG CAPS capsule Take 1 capsule (0.4 mg total) by mouth daily. 90 capsule 3  . zolpidem (AMBIEN) 10 MG tablet Take 10 mg by mouth at bedtime as needed for sleep.     No current facility-administered medications for this visit.     Allergies:   Patient has no known allergies.    Social History:  The patient  reports that he quit smoking about 7 years ago. His smoking use included Cigarettes. He started smoking about 41 years ago. He has a 70.00 pack-year smoking history. He has never used smokeless  tobacco. He reports that he does not drink alcohol or use drugs.   Family History:  The patient's family history includes Diabetes in his mother; Heart disease in his mother; Stroke in his father.    ROS:  Please see the history of present illness. Recent eye exam consistent with mild amiodarone changes.   Otherwise, review of systems are positive for none.   All other systems are reviewed and negative.    PHYSICAL EXAM: VS:  BP 100/70   Pulse 68   Ht 6\' 4"  (1.93 m)   Wt 252 lb (114.3 kg)   BMI 30.67 kg/m  , BMI Body mass index is 30.67 kg/m. GEN: Well nourished, well developed, in no acute distress  HEENT: normal  Neck: no JVD, carotid bruits, or masses Cardiac: RRR; no murmurs, rubs, or gallops,no edema  Respiratory:  clear to auscultation bilaterally, normal work of breathing GI: soft, nontender, nondistended, + BS MS: no deformity or atrophy  Skin: warm and dry, no rash Neuro:  Strength and sensation are intact Psych: euthymic mood, full affect   EKG:  EKG is not ordered today. The ekg ordered today demonstrates N/A   Recent Labs: 02/16/2017: ALT 45; B Natriuretic Peptide 262.9; Hemoglobin 14.8; Platelets 280; TSH 2.058 04/04/2017: BUN 17; Creatinine, Ser 1.27; Potassium 3.8; Sodium 140    Lipid Panel    Component Value Date/Time   CHOL 73 04/04/2017 1604   CHOL 171 04/25/2016 1055   TRIG 154 (H) 04/04/2017 1604   HDL 28 (L) 04/04/2017 1604   HDL 32 (L) 04/25/2016 1055   CHOLHDL 2.6 04/04/2017 1604   VLDL 31 04/04/2017 1604   LDLCALC 14 04/04/2017 1604   LDLCALC 106 (H) 04/25/2016 1055      Wt Readings from Last 3 Encounters:  05/02/17 252 lb (114.3 kg)  04/09/17 251 lb 12.8 oz (114.2 kg)  04/04/17 251 lb 4 oz (114 kg)      Other studies Reviewed: Cardiac cath 12/14/16: Procedures   Right/Left Heart Cath and Coronary Angiography  Conclusion     Mid RCA lesion, 35 %stenosed.  Mid RCA to Dist RCA lesion, 30 %stenosed.  Prox LAD to Mid LAD lesion,  80 %stenosed.  Mid LAD lesion, 70 %stenosed.  Ost Cx to Prox Cx lesion, 100 %stenosed.  There is severe left ventricular systolic dysfunction.  LV end diastolic pressure is normal.  The left ventricular ejection fraction is less than 25% by visual estimate.   1. 2 vessel obstructive CAD    - long segmental mid LAD disease 70-80%    - 100% proximal LCx - CTO with left to left collaterals. 2. Severe LV dysfunction with EF 15% 3. Normal  right heart and LV filling pressures 4. Cardiac output 3.8 L/min with index 1.54.  Plan: patient in Afib today with RVR. Rate 130 at rest. Given severe cardiomyopathy I recommend admission for rate control and initiation of amiodarone. Will anticoagulate with IV heparin. Consider transition to oral anticoagulation. If does not convert on amiodarone would consider DCCV early next week. I suspect his arrhythmia is contributing to his LV dysfunction. Consider options for revascularization once arrhythmia under control. The LAD could be stented relatively easily. Could consider CTO PCI of the LCx in the future.    Indications   Acute systolic CHF (congestive heart failure) (HCC) [I50.21 (ICD-10-CM)]  Procedural Details/Technique   Technical Details Indication: 61 yo WM with recent admission with Afib with RVR and new onset cardiomyopathy. EF 25%. Converted to NSR. Referred for cardiac cath.  Procedural Details: The right wrist was prepped, draped, and anesthetized with 1% lidocaine. Using the modified Seldinger technique a 6 Fr slender sheath was placed in the right radial artery and a 5 French sheath was placed in the right brachial vein. A Swan-Ganz catheter was used for the right heart catheterization. Standard protocol was followed for recording of right heart pressures and sampling of oxygen saturations. Fick cardiac output was calculated. Standard Judkins catheters were used for selective coronary angiography and left ventriculography. There were no  immediate procedural complications. The patient was transferred to the post catheterization recovery area for further monitoring.  Contrast: 115 cc   Estimated blood loss <50 mL.  During this procedure no sedation was administered.    Coronary Findings   Dominance: Right  Left Main  Vessel was injected. Vessel is normal in caliber. Vessel is angiographically normal.  Left Anterior Descending  Prox LAD to Mid LAD lesion, 80% stenosed. The lesion is segmental.  Mid LAD lesion, 70% stenosed.  Left Circumflex  Ost Cx to Prox Cx lesion, 100% stenosed. The lesion is chronically occluded with right-to-left and left-to-left collateral flow.  Lateral Second Obtuse Marginal Branch  Lat 2nd Mrg filled by collaterals from 2nd Sept.  Right Coronary Artery  Mid RCA lesion, 35% stenosed.  Mid RCA to Dist RCA lesion, 30% stenosed.  Right Heart   Right Heart Pressures Normal right heart pressures.    Wall Motion              Left Heart   Left Ventricle The left ventricle is severely dilated. There is severe left ventricular systolic dysfunction. LV end diastolic pressure is normal. The left ventricular ejection fraction is less than 25% by visual estimate. There are LV function abnormalities due to global hypokinesis.    Coronary Diagrams   Diagnostic Diagram       Implants     No implant documentation for this case.  PACS Images   Show images for Cardiac catheterization   Link to Procedure Log   Procedure Log    Hemo Data    Most Recent Value  Fick Cardiac Output 3.82 L/min  Fick Cardiac Output Index 1.54 (L/min)/BSA  RA A Wave 8 mmHg  RA V Wave 8 mmHg  RA Mean 7 mmHg  RV Systolic Pressure 28 mmHg  RV Diastolic Pressure 2 mmHg  RV EDP 5 mmHg  PA Systolic Pressure 32 mmHg  PA Diastolic Pressure 10 mmHg  PA Mean 21 mmHg  PW A Wave 18 mmHg  PW V Wave 15 mmHg  PW Mean 16 mmHg  AO Systolic Pressure 93 mmHg  AO Diastolic Pressure 68 mmHg  AO  Mean 78 mmHg  LV  Systolic Pressure 82 mmHg  LV Diastolic Pressure 8 mmHg  LV EDP 12 mmHg  Arterial Occlusion Pressure Extended Systolic Pressure 91 mmHg  Arterial Occlusion Pressure Extended Diastolic Pressure 58 mmHg  Arterial Occlusion Pressure Extended Mean Pressure 70 mmHg  Left Ventricular Apex Extended Systolic Pressure 84 mmHg  Left Ventricular Apex Extended Diastolic Pressure 9 mmHg  Left Ventricular Apex Extended EDP Pressure 14 mmHg  QP/QS 1  TPVR Index 13.61 HRUI  TSVR Index 47.34 HRUI  PVR SVR Ratio 0.08  TPVR/TSVR Ratio 0.29   Echo 04/04/17: Study Conclusions  - Left ventricle: The cavity size was mildly dilated. Wall   thickness was normal. Systolic function was moderately to   severely reduced. The estimated ejection fraction was in the   range of 30% to 35%. Diffuse hypokinesis. GLS -14.1%. Doppler   parameters are consistent with abnormal left ventricular   relaxation (grade 1 diastolic dysfunction). - Aortic valve: There was no stenosis. - Aorta: Mildly dilated aortic root. Aortic root dimension: 39 mm   (ED). - Mitral valve: There was trivial regurgitation. - Left atrium: The atrium was mildly dilated. - Right ventricle: The cavity size was normal. Systolic function   was normal. - Right atrium: The atrium was mildly dilated. - Tricuspid valve: Peak RV-RA gradient (S): 22 mm Hg. - Pulmonary arteries: PA peak pressure: 25 mm Hg (S). - Inferior vena cava: The vessel was normal in size. The   respirophasic diameter changes were in the normal range (>= 50%),   consistent with normal central venous pressure.  Impressions:  - Mildly dilated left ventricle with EF 30-35%, diffuse   hypokinesis. Normal RV size and systolic function. Mild biatrial   enlargement. Trivial mitral regurgitation.   ASSESSMENT AND PLAN:  1.  Ischemic cardiomyopathy with chronic systolic CHF. With optimal medical therapy and control of Afib his EF has improved from less than 20% to 30-35% but is  still significantly impaired. I personally reviewed his Echo and cath findings. I agree that his LV is globally down but no evidence of scar or thinning. He has CTO of the proximal LCx and 75% mid LAD stenosis. I think he would benefit from revascularization to hopefully achieve further improvement in LV function. We discussed CTO PCI of the LCx and stenting of the mid LAD. The procedure and risks were reviewed including but not limited to death, myocardial infarction, stroke, arrythmias, bleeding, transfusion, emergency surgery, dye allergy, perforation, or renal dysfunction. The patient voices understanding and is agreeable to proceed. Will schedule for August 15. Prior to procedure will initiate antiplatelet therapy with ASA 81 mg and Plavix 75 mg daily. Will need to hold Eliquis 48 hours prior to procedure. I would anticipate DAPT for one month after stenting then stopping ASA since he is on Eliquis. Will arrange lab work the week prior to procedure.   2. Atrial fibrillation- currently in NSR on amiodarone. Plan ablation by Dr. Elberta Fortis but I agree that it would be appropriate to pursue revascularization first.   3. Mitral insufficiency- improved.   Current medicines are reviewed at length with the patient today.  The patient does not have concerns regarding medicines.  The following changes have been made:  See above  Labs/ tests ordered today include:   Orders Placed This Encounter  Procedures  . Basic metabolic panel  . CBC w/Diff/Platelet  . PT and PTT     Disposition:   PCI scheduled for August 15.  Signed, Hondo Nanda [place], MD  05/02/2017 10:20 AM    [facility] [address] [place], [place], [address] Phone [phone], Fax [phone]

## 2017-05-30 NOTE — Discharge Summary (Signed)
Discharge Summary    Patient ID: Kevin Oconnell,  MRN: [id number], DOB/AGE: [birth year] 61 y.o.  Admit date: 05/30/2017 Discharge date: 05/31/2017  Primary Care Provider: Elenora Gamma Primary Cardiologist: Branch  Discharge Diagnoses    Principal Problem:   Ischemic cardiomyopathy Active Problems:   Atrial fibrillation [facility])   Chronic systolic CHF (congestive heart failure) (HCC)   Mitral regurgitation   Allergies No Known Allergies  Diagnostic Studies/Procedures    LHC: 05/30/17  Conclusion     Mid RCA lesion, 35 %stenosed.  Mid RCA to Dist RCA lesion, 30 %stenosed.  Ost Cx to Prox Cx lesion, 100 %stenosed.  A STENT SYNERGY DES [id number] drug eluting stent was successfully placed.  Post intervention, there is a 0% residual stenosis.  Prox LAD to Mid LAD lesion, 80 %stenosed.  A STENT SYNERGY DES [id number] drug-eluting stent was successfully placed.  Mid LAD lesion, 70 %stenosed.  A STENT SYNERGY DES [id number] drug eluting stent was successfully placed.  Post intervention, there is a 0% residual stenosis.   1. Successful CTO PCI of the proximal to mid LCx with DES x 1 2. Successful stenting of the proximal to mid LAD with DES x 2 3. Low LVEDP 8 mm Hg  Plan: DAPT for one month then stop ASA. Continue Plavix for at lease one year. May resume Eliquis in am if no bleeding issues. Anticipate DC in am.    _____________   History of Present Illness     Kevin Oconnell is a 61 y.o. male who was seen by Dr. Swaziland at the request of Dr. Shirlee Latch for consideration for coronary revascularization. He presented in February 2018 with progressive dyspnea. He went to the ER at [facility] and was admitted. He was in Afib with RVR. Echo in 2/18 showed EF <20% with severe MR. He went back into NSR spontaneously. He followed up with Dr. Wyline Mood and was set up for a cath in 3/18. He was found to have a chronically occluded LCx and a long proximal to mid LAD stenosis of 70%. No  intervention was done. He was noted to be back in rapid atrial fibrillation and was admitted. He was diuresed and started on amiodarone, he converted back to NSR again spontaneously. He has since maintained NSR. Followed by Dr. Shirlee Latch in CHF clinic and medications optimized. Echo repeated on June 20 and EF 30-35% with trivial MR. Global hypokinesis with no thinned or akinetic areas. It was felt that his LV function may improve further with revascularization. Afib ablation also being considered for his Afib based on CASTLE HF trial but it was felt this should await consideration of revascularization.   Hospital Course     He underwent cardiac catheterization with Dr. Swaziland noted above with successful CTO/PCI of proximal/mid Lcx with DES x1, and PCI/DES x2 to mLAD. Plan for triple triple therapy for one month with ASA/plavix/eliquis, then stop ASA. Post cath labs showed Cr 1.00 and Hgb 13.5. No complications noted. Worked well with cardiac rehab. No further chest pain.   He was seen by Dr. Allyson Sabal and determined stable for discharge home. Follow up in the office has been arranged. Medications are listed below.   General: Well developed, well nourished, male appearing in no acute distress. Head: Normocephalic, atraumatic.  Neck: Supple without bruits, JVD. Lungs:  Resp regular and unlabored, CTA. Heart: RRR, S1, S2, no S3, S4, or murmur; no rub. Abdomen: Soft, non-tender, non-distended with normoactive bowel sounds. No hepatomegaly. No rebound/guarding.  No obvious abdominal masses. Extremities: No clubbing, cyanosis, edema. Distal pedal pulses are 2+ bilaterally. L/R femoral cath site stable. Neuro: Alert and oriented X 3. Moves all extremities spontaneously. Psych: Normal affect. _____________  Discharge Vitals Blood pressure 107/60, pulse 74, temperature 98.2 F (36.8 C), temperature source Oral, resp. rate 13, height 6\' 4"  (1.93 m), weight 247 lb (112 kg), SpO2 96 %.  Filed Weights   05/30/17  0937  Weight: 247 lb (112 kg)    Labs & Radiologic Studies    CBC  Recent Labs  05/31/17 0346  WBC 8.2  HGB 13.5  HCT 41.5  MCV 91.4  PLT 254   Basic Metabolic Panel  Recent Labs  05/31/17 0346  NA 140  K 4.1  CL 107  CO2 25  GLUCOSE 123*  BUN 14  CREATININE 1.00  CALCIUM 8.9   Liver Function Tests No results for input(s): AST, ALT, ALKPHOS, BILITOT, PROT, ALBUMIN in the last 72 hours. No results for input(s): LIPASE, AMYLASE in the last 72 hours. Cardiac Enzymes No results for input(s): CKTOTAL, CKMB, CKMBINDEX, TROPONINI in the last 72 hours. BNP Invalid input(s): POCBNP D-Dimer No results for input(s): DDIMER in the last 72 hours. Hemoglobin A1C No results for input(s): HGBA1C in the last 72 hours. Fasting Lipid Panel No results for input(s): CHOL, HDL, LDLCALC, TRIG, CHOLHDL, LDLDIRECT in the last 72 hours. Thyroid Function Tests No results for input(s): TSH, T4TOTAL, T3FREE, THYROIDAB in the last 72 hours.  Invalid input(s): FREET3 _____________  No results found. Disposition   Pt is being discharged home today in good condition.  Follow-up Plans & Appointments    Follow-up Information    Swaziland, Peter M, MD Follow up on 06/21/2017.   Specialty:  Cardiology Why:  at 9am for your follow up appt.  Contact information: [address] [place] [address] [phone]          Discharge Instructions    (HEART FAILURE PATIENTS) Call MD:  Anytime you have any of the following symptoms: 1) 3 pound weight gain in 24 hours or 5 pounds in 1 week 2) shortness of breath, with or without a dry hacking cough 3) swelling in the hands, feet or stomach 4) if you have to sleep on extra pillows at night in order to breathe.    Complete by:  As directed    AMB Referral to Cardiac Rehabilitation - Phase II    Complete by:  As directed    Diagnosis:  Coronary Stents   Amb Referral to Cardiac Rehabilitation    Complete by:  As directed     Diagnosis:  Coronary Stents   Call MD for:  redness, tenderness, or signs of infection (pain, swelling, redness, odor or green/yellow discharge around incision site)    Complete by:  As directed    Diet - low sodium heart healthy    Complete by:  As directed    Discharge instructions    Complete by:  As directed    Groin Site Care Refer to this sheet in the next few weeks. These instructions provide you with information on caring for yourself after your procedure. Your caregiver may also give you more specific instructions. Your treatment has been planned according to current medical practices, but problems sometimes occur. Call your caregiver if you have any problems or questions after your procedure. HOME CARE INSTRUCTIONS You may shower 24 hours after the procedure. Remove the bandage (dressing) and gently wash the site with  plain soap and water. Gently pat the site dry.  Do not apply powder or lotion to the site.  Do not sit in a bathtub, swimming pool, or whirlpool for 5 to 7 days.  No bending, squatting, or lifting anything over 10 pounds (4.5 kg) as directed by your caregiver.  Inspect the site at least twice daily.  Do not drive home if you are discharged the same day of the procedure. Have someone else drive you.  You may drive 24 hours after the procedure unless otherwise instructed by your caregiver.  What to expect: Any bruising will usually fade within 1 to 2 weeks.  Blood that collects in the tissue (hematoma) may be painful to the touch. It should usually decrease in size and tenderness within 1 to 2 weeks.  SEEK IMMEDIATE MEDICAL CARE IF: You have unusual pain at the groin site or down the affected leg.  You have redness, warmth, swelling, or pain at the groin site.  You have drainage (other than a small amount of blood on the dressing).  You have chills.  You have a fever or persistent symptoms for more than 72 hours.  You have a fever and your symptoms suddenly get worse.    Your leg becomes pale, cool, tingly, or numb.  You have heavy bleeding from the site. Hold pressure on the site. Marland Kitchen  PLEASE DO NOT MISS ANY DOSES OF YOUR PLAVIX!!! You will need to be on aspirin, eliquis and plavix for one month. Then will plan to stop aspirin after one month and continue plavix, eliquis. Please call the office with any questions. You have follow up arranged with Dr. Swaziland.   Increase activity slowly    Complete by:  As directed       Discharge Medications   Current Discharge Medication List    START taking these medications   Details  aspirin 81 MG chewable tablet Chew 1 tablet (81 mg total) by mouth daily. Qty: 30 tablet, Refills: 0    nitroGLYCERIN (NITROSTAT) 0.4 MG SL tablet Place 1 tablet (0.4 mg total) under the tongue every 5 (five) minutes as needed. Qty: 25 tablet, Refills: 2      CONTINUE these medications which have NOT CHANGED   Details  acetaminophen (TYLENOL) 325 MG tablet Take 650 mg by mouth every 6 (six) hours as needed (FOR HEADACHES/SINUS ISSUES.).    amiodarone (PACERONE) 200 MG tablet Take 200 mg by mouth daily.    apixaban (ELIQUIS) 5 MG TABS tablet Take 1 tablet (5 mg total) by mouth 2 (two) times daily. Qty: 180 tablet, Refills: 3    atorvastatin (LIPITOR) 80 MG tablet Take 1 tablet (80 mg total) by mouth daily at 6 PM. Qty: 90 tablet, Refills: 1    clopidogrel (PLAVIX) 75 MG tablet Start taking 75 mg daily 1 week before cath 05/23/17. Qty: 30 tablet, Refills: 6    DULoxetine (CYMBALTA) 60 MG capsule Take 1 capsule (60 mg total) by mouth daily. Qty: 90 capsule, Refills: 3    furosemide (LASIX) 20 MG tablet Take 20 mg by mouth daily as needed (for swelling or weight gain of 3 Lbs.).     lisinopril (PRINIVIL,ZESTRIL) 2.5 MG tablet Take 1 tablet (2.5 mg total) by mouth daily. Qty: 90 tablet, Refills: 1    metoprolol succinate (TOPROL XL) 25 MG 24 hr tablet Take 1 tablet (25 mg total) by mouth 2 (two) times daily. Qty: 180 tablet,  Refills: 3    Omega-3 Fatty Acids (FISH  OIL) 1000 MG CAPS Take 1,000 mg by mouth every other day.     potassium chloride (K-DUR,KLOR-CON) 10 MEQ tablet TAKE ONLY ON DAYS WHEN TAKING LASIX Qty: 90 tablet, Refills: 3    spironolactone (ALDACTONE) 25 MG tablet Take 0.5 tablets (12.5 mg total) by mouth daily. Qty: 45 tablet, Refills: 3    tamsulosin (FLOMAX) 0.4 MG CAPS capsule Take 1 capsule (0.4 mg total) by mouth daily. Qty: 90 capsule, Refills: 3    zolpidem (AMBIEN) 10 MG tablet Take 5 mg by mouth at bedtime as needed for sleep.       STOP taking these medications     aspirin 325 MG tablet          Aspirin prescribed at discharge?  Yes High Intensity Statin Prescribed? (Lipitor 40-80mg  or Crestor 20-40mg ): Yes Beta Blocker Prescribed? Yes For EF <40%, was ACEI/ARB Prescribed? Yes ADP Receptor Inhibitor Prescribed? (i.e. Plavix etc.-Includes Medically Managed Patients): Yes For EF <40%, Aldosterone Inhibitor Prescribed? Yes Was EF assessed during THIS hospitalization? Yes Was Cardiac Rehab II ordered? (Included Medically managed Patients): Yes   Outstanding Labs/Studies   Consider follow up echo in 6 weeks post PCI.   Duration of Discharge Encounter   Greater than 30 minutes including physician time.  Signed, April Carlyon NP-C 05/31/2017, 9:46 AM  Agree with note by Laverda Page NP-C  Patient is status post circumflex PCI and drug-eluting stenting of a CTO as well as stenting of the LAD. Groins are stable. He can be discharged home today on low-dose aspirin, Plavix. He can restart his Eliquis today. He will follow-up with Dr. Swaziland as an outpatient.   Runell Gess, M.D., FACP, [facility], [facility] [address] [place], [place]  [address]  [phone] 05/31/2017 10:56 AM

## 2017-05-31 ENCOUNTER — Encounter (HOSPITAL_COMMUNITY): Payer: Self-pay | Admitting: Cardiology

## 2017-05-31 DIAGNOSIS — I5022 Chronic systolic (congestive) heart failure: Secondary | ICD-10-CM

## 2017-05-31 DIAGNOSIS — I255 Ischemic cardiomyopathy: Secondary | ICD-10-CM

## 2017-05-31 DIAGNOSIS — I340 Nonrheumatic mitral (valve) insufficiency: Secondary | ICD-10-CM

## 2017-05-31 DIAGNOSIS — I482 Chronic atrial fibrillation: Secondary | ICD-10-CM | POA: Diagnosis not present

## 2017-05-31 DIAGNOSIS — I4891 Unspecified atrial fibrillation: Secondary | ICD-10-CM | POA: Diagnosis not present

## 2017-05-31 LAB — CBC
HCT: 41.5 % (ref 39.0–52.0)
Hemoglobin: 13.5 g/dL (ref 13.0–17.0)
MCH: 29.7 pg (ref 26.0–34.0)
MCHC: 32.5 g/dL (ref 30.0–36.0)
MCV: 91.4 fL (ref 78.0–100.0)
PLATELETS: 254 10*3/uL (ref 150–400)
RBC: 4.54 MIL/uL (ref 4.22–5.81)
RDW: 15.1 % (ref 11.5–15.5)
WBC: 8.2 10*3/uL (ref 4.0–10.5)

## 2017-05-31 LAB — BASIC METABOLIC PANEL
Anion gap: 8 (ref 5–15)
BUN: 14 mg/dL (ref 6–20)
CHLORIDE: 107 mmol/L (ref 101–111)
CO2: 25 mmol/L (ref 22–32)
CREATININE: 1 mg/dL (ref 0.61–1.24)
Calcium: 8.9 mg/dL (ref 8.9–10.3)
GFR calc non Af Amer: >60 mL/min (ref >60)
Glucose, Bld: 123 mg/dL — ABNORMAL HIGH (ref 65–99)
Potassium: 4.1 mmol/L (ref 3.5–5.1)
Sodium: 140 mmol/L (ref 135–145)

## 2017-05-31 MED ORDER — APIXABAN 5 MG PO TABS
5.0000 mg | ORAL_TABLET | Freq: Two times a day (BID) | ORAL | Status: DC
  Administered 2017-05-31: 11:00:00 5 mg via ORAL
  Filled 2017-05-31: qty 1

## 2017-05-31 MED ORDER — ASPIRIN 81 MG PO CHEW: 81.0000 mg | CHEWABLE_TABLET | Freq: Every day | ORAL | 0 refills | Status: DC

## 2017-05-31 MED ORDER — NITROGLYCERIN 0.4 MG SL SUBL: 0.4000 mg | SUBLINGUAL_TABLET | SUBLINGUAL | 2 refills | Status: DC | PRN

## 2017-05-31 NOTE — Progress Notes (Signed)
CARDIAC REHAB PHASE I   PRE:  Rate/Rhythm: 69 SR  BP:  Supine: 107/60  Sitting:   Standing:    SaO2:   MODE:  Ambulation: 600 ft   POST:  Rate/Rhythm: 83 SR  BP:  Supine: 124/71  Sitting:   Standing:    SaO2: 97%RA [id number] Saw pt last admission. Has made changes in diet and watches sodium. Knows when to call with weight gain. Walked 600 ft with steady gait. Right groin sore. No CP. Discussed importance of plavix with stent, NTG use, ex ed and CRP 2. Will refer to [facility] program. A little dizzy upon standing that cleared.   Luetta Nutting, RN BSN  05/31/2017 8:34 AM

## 2017-05-31 NOTE — Care Management Note (Signed)
Case Management Note  Patient Details  Name: Kevin Oconnell MRN: [id number] Date of Birth: Nov 10, 1955  Subjective/Objective:   From home, pta indep s/p coronary stent intervention, will be on plavix.  For discharge today, no needs.                 Action/Plan:   Expected Discharge Date:  05/31/17               Expected Discharge Plan:  Home/Self Care  In-House Referral:     Discharge planning Services  CM Consult  Post Acute Care Choice:    Choice offered to:     DME Arranged:    DME Agency:     HH Arranged:    HH Agency:     Status of Service:  Completed, signed off  If discussed at [employer] of Stay Meetings, dates discussed:    Additional Comments:  Leone Haven, RN 05/31/2017, 9:49 AM

## 2017-06-01 ENCOUNTER — Other Ambulatory Visit: Payer: Self-pay | Admitting: *Deleted

## 2017-06-01 MED ORDER — CLOPIDOGREL BISULFATE 75 MG PO TABS: 75.0000 mg | ORAL_TABLET | Freq: Every day | ORAL | 3 refills | Status: DC

## 2017-06-04 ENCOUNTER — Other Ambulatory Visit: Payer: Self-pay | Admitting: Cardiology

## 2017-06-05 ENCOUNTER — Other Ambulatory Visit (HOSPITAL_COMMUNITY): Payer: Self-pay | Admitting: Cardiology

## 2017-06-05 MED ORDER — SPIRONOLACTONE 25 MG PO TABS
12.5000 mg | ORAL_TABLET | Freq: Every day | ORAL | 3 refills | Status: DC
Start: 2017-06-05 — End: 2017-07-10

## 2017-06-06 ENCOUNTER — Encounter (HOSPITAL_COMMUNITY): Payer: Self-pay | Admitting: Cardiology

## 2017-06-06 ENCOUNTER — Ambulatory Visit (HOSPITAL_COMMUNITY)
Admission: RE | Admit: 2017-06-06 | Discharge: 2017-06-06 | Disposition: A | Discharge status: Home / Self Care | Source: Ambulatory Visit | Attending: Cardiology | Admitting: Cardiology

## 2017-06-06 ENCOUNTER — Encounter (HOSPITAL_COMMUNITY): Payer: Self-pay | Admitting: *Deleted

## 2017-06-06 VITALS — BP 115/73 | HR 76 | Wt 253.2 lb

## 2017-06-06 DIAGNOSIS — I5022 Chronic systolic (congestive) heart failure: Secondary | ICD-10-CM

## 2017-06-06 DIAGNOSIS — I2510 Atherosclerotic heart disease of native coronary artery without angina pectoris: Secondary | ICD-10-CM

## 2017-06-06 DIAGNOSIS — I429 Cardiomyopathy, unspecified: Secondary | ICD-10-CM | POA: Diagnosis not present

## 2017-06-06 DIAGNOSIS — Z87891 Personal history of nicotine dependence: Secondary | ICD-10-CM | POA: Insufficient documentation

## 2017-06-06 DIAGNOSIS — Z955 Presence of coronary angioplasty implant and graft: Secondary | ICD-10-CM | POA: Diagnosis not present

## 2017-06-06 DIAGNOSIS — I480 Paroxysmal atrial fibrillation: Secondary | ICD-10-CM | POA: Diagnosis present

## 2017-06-06 DIAGNOSIS — Z7901 Long term (current) use of anticoagulants: Secondary | ICD-10-CM | POA: Diagnosis not present

## 2017-06-06 DIAGNOSIS — I340 Nonrheumatic mitral (valve) insufficiency: Secondary | ICD-10-CM | POA: Diagnosis not present

## 2017-06-06 DIAGNOSIS — Z7902 Long term (current) use of antithrombotics/antiplatelets: Secondary | ICD-10-CM | POA: Diagnosis not present

## 2017-06-06 LAB — COMPREHENSIVE METABOLIC PANEL
ALBUMIN: 4 g/dL (ref 3.5–5.0)
ALK PHOS: 71 U/L (ref 38–126)
ALT: 48 U/L (ref 17–63)
ANION GAP: 7 (ref 5–15)
AST: 24 U/L (ref 15–41)
BILIRUBIN TOTAL: 1.1 mg/dL (ref 0.3–1.2)
BUN: 20 mg/dL (ref 6–20)
CALCIUM: 9.3 mg/dL (ref 8.9–10.3)
CO2: 26 mmol/L (ref 22–32)
Chloride: 107 mmol/L (ref 101–111)
Creatinine, Ser: 1.14 mg/dL (ref 0.61–1.24)
Glucose, Bld: 112 mg/dL — ABNORMAL HIGH (ref 65–99)
POTASSIUM: 3.9 mmol/L (ref 3.5–5.1)
Sodium: 140 mmol/L (ref 135–145)
TOTAL PROTEIN: 6.5 g/dL (ref 6.5–8.1)

## 2017-06-06 LAB — CBC
HEMATOCRIT: 40.8 % (ref 39.0–52.0)
HEMOGLOBIN: 13.5 g/dL (ref 13.0–17.0)
MCH: 29.6 pg (ref 26.0–34.0)
MCHC: 33.1 g/dL (ref 30.0–36.0)
MCV: 89.5 fL (ref 78.0–100.0)
PLATELETS: 313 10*3/uL (ref 150–400)
RBC: 4.56 MIL/uL (ref 4.22–5.81)
RDW: 14.4 % (ref 11.5–15.5)
WBC: 7 10*3/uL (ref 4.0–10.5)

## 2017-06-06 LAB — TSH: TSH: 1.724 u[IU]/mL (ref 0.350–4.500)

## 2017-06-06 MED ORDER — LISINOPRIL 5 MG PO TABS: 5.0000 mg | ORAL_TABLET | Freq: Every day | ORAL | 3 refills | Status: DC

## 2017-06-06 NOTE — Patient Instructions (Signed)
Labs today (will call for abnormal results, otherwise no news is good news)  STOP taking Aspirin on July 01, 2017.  Continue taking Eliquis and Plavix  INCREASE Lisinopril to 5 mg (1 Tablet) Daily  Labs in 10 days (bmet)  Follow up in 1 Month  Echocardiogram in 3 months

## 2017-06-06 NOTE — Progress Notes (Signed)
PCP: Dr. Ermalinda Memos Cardiology: Dr. Wyline Mood HF Cardiology: Dr. Shirlee Latch  61 yo with history of paroxysmal atrial fibrillation, CAD, and chronic systolic CHF was referred by Dr. Wyline Mood for CHF clinic evaluation.    Patient had no cardiac history prior to 2/18.  In 2/18, he developed the onset of exertional dyspnea. After steadily worsening dyspnea, he went to the ER at [facility] and was admitted.  Echo in 2/18 showed EF < 20% with severe MR.  He went back into NSR spontaneously.  He followed up with Dr. Wyline Mood and was set up for a cath in 3/18.  He was found to have a chronically occluded LCx and a long proximal to mid LAD stenosis.  No intervention was done.  He was noted to be back in rapid atrial fibrillation and was admitted.  He was diuresed and started on amiodarone, he converted back to NSR again spontaneously. He is in NSR today.   Underwent successful CTO procedure in 8/18 with DES to LCx, DES to pLAD, DES to dLAD.   Mr. Salehi presents today for follow up. He has a level one hematoma on his left groin, right groin is level zero. No bleeding from site, but he does say groins were painful for a few days, now resolved. No SOB with walking into clinic, no SOB with stairs. Taking all medications. Weights at home 247-249 pounds. Eating a low sodium diet, drinking less than 2L a day.   Labs (4/18): K 4.5, creatinine 1.0 Labs (5/18): K 4.2, creatinine 1.25, LFTs normal, TSH normal Labs (6/18): LDL 14, HDL 28 Labs (8/18): K 4.1, creatinine 1.0  ECG (personally reviewed): NSR, PVC, nonspecific T wave flattening  PMH: 1. Atrial fibrillation: Paroxysmal.  2. 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  - Echo (2/18) with mild LV dilation, EF < 20%, severe mitral regurgitation.  - RHC/LHC (3/18): long 80% proximal-mid LAD stenosis, total occlusion of the LCx with left to left collaterals, RCA ok. Mean RA 7, PA  32/10, mean PCWP 16, LVEDP 12, CI 1.54.  - Echo (6/18) with EF 30-35%, diffuse hypokinesis, normal RV size and systolic function, trivial MR.  3. CAD: LHC (3/18) with long 80% proximal-mid LAD stenosis, total occlusion of the LCx with left to left collaterals, RCA ok. - 8/18: CTO intervention with DES to LCx and DES x 2 to proximal and mid LAD.  4. Mitral regurgitation: Severe on 2/18 echo.  Suspect functional, as MR only trivial on 6/18 echo.  5. Sleep study (6/18) with no OSA but nocturnal hypoxemia.   SH: Midwife and school custodian, lives in [place], quit smoking in 2011, married.   FH: Grandfather with MIs, mother with CABG and CHF.   Review of systems complete and found to be negative unless listed in HPI.    Current Outpatient Prescriptions  Medication Sig Dispense Refill  . acetaminophen (TYLENOL) 325 MG tablet Take 650 mg by mouth every 6 (six) hours as needed (FOR HEADACHES/SINUS ISSUES.).    Marland Kitchen amiodarone (PACERONE) 200 MG tablet Take 200 mg by mouth daily.    Marland Kitchen apixaban (ELIQUIS) 5 MG TABS tablet Take 1 tablet (5 mg total) by mouth 2 (two) times daily. 180 tablet 3  . atorvastatin (LIPITOR) 80 MG tablet TAKE 1 TABLET DAILY AT 6 P.M. 90 tablet 1  . clopidogrel (PLAVIX) 75 MG tablet Take 1 tablet (75 mg total) by mouth daily. 90 tablet 3  . DULoxetine (CYMBALTA) 60  MG capsule Take 1 capsule (60 mg total) by mouth daily. 90 capsule 3  . furosemide (LASIX) 20 MG tablet Take 20 mg by mouth daily as needed (for swelling or weight gain of 3 Lbs.).     Marland Kitchen lisinopril (PRINIVIL,ZESTRIL) 2.5 MG tablet Take 1 tablet (2.5 mg total) by mouth daily. 90 tablet 1  . metoprolol succinate (TOPROL XL) 25 MG 24 hr tablet Take 1 tablet (25 mg total) by mouth 2 (two) times daily. 180 tablet 3  . nitroGLYCERIN (NITROSTAT) 0.4 MG SL tablet Place 1 tablet (0.4 mg total) under the tongue every 5 (five) minutes as needed. 25 tablet 2  . Omega-3 Fatty Acids (FISH OIL) 1000 MG CAPS Take 1,000 mg by mouth  every other day.     . potassium chloride (K-DUR,KLOR-CON) 10 MEQ tablet TAKE ONLY ON DAYS WHEN TAKING LASIX 90 tablet 3  . spironolactone (ALDACTONE) 25 MG tablet Take 0.5 tablets (12.5 mg total) by mouth at bedtime. 45 tablet 3  . tamsulosin (FLOMAX) 0.4 MG CAPS capsule Take 1 capsule (0.4 mg total) by mouth daily. 90 capsule 3  . zolpidem (AMBIEN) 10 MG tablet Take 5 mg by mouth at bedtime as needed for sleep.      No current facility-administered medications for this encounter.    BP 115/73   Pulse 76   Wt 253 lb 4 oz (114.9 kg)   SpO2 98%   BMI 30.83 kg/m    Wt Readings from Last 3 Encounters:  06/06/17 253 lb 4 oz (114.9 kg)  05/30/17 247 lb (112 kg)  05/02/17 252 lb (114.3 kg)    General: Well appearing. No resp difficulty. HEENT: Normal Neck: Supple. JVP 5-6. Carotids 2+ bilat; no bruits. No thyromegaly or nodule noted. Cor: PMI nondisplaced. RRR, No M/G/R noted Lungs: CTAB, normal effort. Abdomen: Soft, non-tender, non-distended, no HSM. No bruits or masses. +BS  Extremities: No cyanosis, clubbing, rash, R and LLE no edema. Left groin is level 2, right groin level zero  Neuro: Alert & orientedx3, cranial nerves grossly intact. moves all 4 extremities w/o difficulty. Affect pleasant  Assessment/Plan: 1. Atrial fibrillation: Paroxysmal. He remains in NSR today. - Continue Amiodarone 100 mg daily. Check CMET and TSH today.  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 Advised him to keep following up with his eye doctor. He will see Dr. Elberta Fortis on 9/10 to discuss atrial fibrillation ablation.  After ablation, hopefully he can stop amiodarone.  - Continue Eliquis.   2. 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  Echo in 2/18 with EF < 20%.  EF 30-35% in 6/18.  NYHA II - Volume status stable on exam, continue prn lasix - Continue toprol XL 25 mg daily - Increase  lisinopril to 5 mg daily. BMET today and again in 10 days.  - Continue Spiro 12.5 mg daily. Consider increasing to 25 mg next visit if BP is stable after increase in lisinopril.  - Repeat echo in 11/18 (3 months post PCI).  If EF low, will need ICD.   3. CAD: s/p DES to LCx and LAD in 05/2017.  - Denies chest pain.  - excellent lipids on current statin.  - Continue triple therapy with ASA + Plavix + Eliquis but will stop ASA after 1 month on 07/01/17.  - Check CBC today.   4. Sleep study showed nocturnal hypoxemia but no OSA.  - Wears  02 at night  5. Mitral regurgitation: - Trivial by last Echo   Follow up in one month.   Little Ishikawa, NP  06/06/2017   Patient seen with NP, agree with the above note.   He is now s/p PCI to CTO LCx and also to proximal and mid LAD.  Will need repeat echo in 3 months, ICD if EF remains < 35% .  He will see Dr. Elberta Fortis soon to discuss atrial fibrillation ablation.  Would like to have this done then get him off amiodarone, as he reports his eye doctor saw early amiodarone deposits (he did not recommend stopping amiodarone yet).   Volume status looks ok. I will increase lisinopril to 5 mg daily.  BMET today and in 10 days.   Marca Ancona 06/08/2017

## 2017-06-15 ENCOUNTER — Telehealth: Payer: Self-pay | Admitting: Cardiology

## 2017-06-15 NOTE — Telephone Encounter (Signed)
Returned call to patient's wife Patient needs work excuse from 8/15 to 8/24 of 2018 -- Attn: [facility] -- CC: [employer] Patient has MD OV 06/21/17

## 2017-06-15 NOTE — Telephone Encounter (Signed)
New message  Pt wife call requesting to speak with RN about getting a note for work stating pt had surgery on 8/15. Please call back to discuss

## 2017-06-15 NOTE — Telephone Encounter (Signed)
OK to write letter. Had CTO PCI.   Stephaie Dardis Swaziland MD, [facility]
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
## 2017-06-20 NOTE — Telephone Encounter (Signed)
Returned call to patient no answer.LMTC. 

## 2017-06-21 ENCOUNTER — Encounter: Payer: Self-pay | Admitting: Cardiology

## 2017-06-21 ENCOUNTER — Ambulatory Visit (INDEPENDENT_AMBULATORY_CARE_PROVIDER_SITE_OTHER): Admitting: Cardiology

## 2017-06-21 VITALS — BP 100/74 | HR 56 | Ht 76.0 in | Wt 256.4 lb

## 2017-06-21 DIAGNOSIS — I480 Paroxysmal atrial fibrillation: Secondary | ICD-10-CM

## 2017-06-21 DIAGNOSIS — I5022 Chronic systolic (congestive) heart failure: Secondary | ICD-10-CM | POA: Diagnosis not present

## 2017-06-21 DIAGNOSIS — I2510 Atherosclerotic heart disease of native coronary artery without angina pectoris: Secondary | ICD-10-CM | POA: Diagnosis not present

## 2017-06-21 DIAGNOSIS — I255 Ischemic cardiomyopathy: Secondary | ICD-10-CM | POA: Diagnosis not present

## 2017-06-21 NOTE — Telephone Encounter (Signed)
Spoke to patient at Dr.Jordan's office visit.Letter was given to patient to excuse him from work 05/30/17 to 06/06/17.

## 2017-06-21 NOTE — Patient Instructions (Signed)
Continue your current therapy  I will see you prn   

## 2017-06-23 ENCOUNTER — Other Ambulatory Visit: Payer: Self-pay | Admitting: Cardiology

## 2017-06-25 ENCOUNTER — Other Ambulatory Visit: Payer: Self-pay | Admitting: Cardiology

## 2017-06-25 ENCOUNTER — Encounter: Payer: Self-pay | Admitting: *Deleted

## 2017-06-25 ENCOUNTER — Encounter: Payer: Self-pay | Admitting: Cardiology

## 2017-06-25 ENCOUNTER — Ambulatory Visit (INDEPENDENT_AMBULATORY_CARE_PROVIDER_SITE_OTHER): Admitting: Cardiology

## 2017-06-25 VITALS — BP 100/62 | HR 67 | Ht 76.0 in | Wt 255.2 lb

## 2017-06-25 DIAGNOSIS — I25118 Atherosclerotic heart disease of native coronary artery with other forms of angina pectoris: Secondary | ICD-10-CM | POA: Diagnosis not present

## 2017-06-25 DIAGNOSIS — I4819 Other persistent atrial fibrillation: Secondary | ICD-10-CM

## 2017-06-25 DIAGNOSIS — I481 Persistent atrial fibrillation: Secondary | ICD-10-CM

## 2017-06-25 DIAGNOSIS — I255 Ischemic cardiomyopathy: Secondary | ICD-10-CM

## 2017-06-25 NOTE — Patient Instructions (Signed)
Your physician recommends that you continue on your current medications as directed. Please refer to the Current Medication list given to you today.  Your physician has recommended that you have an ablation. Catheter ablation is a medical procedure used to treat some cardiac arrhythmias (irregular heartbeats). During catheter ablation, a long, thin, flexible tube is put into a blood vessel in your groin (upper thigh), or neck. This tube is called an ablation catheter. It is then guided to your heart through the blood vessel. Radio frequency waves destroy small areas of heart tissue where abnormal heartbeats may cause an arrhythmia to start.   You will need to have blood work and a CT scan completed prior to the procedure.  We will contact you to make all of the arrangements.
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
## 2017-06-28 ENCOUNTER — Encounter: Payer: Self-pay | Admitting: *Deleted

## 2017-06-28 ENCOUNTER — Telehealth: Payer: Self-pay | Admitting: *Deleted

## 2017-06-28 DIAGNOSIS — I480 Paroxysmal atrial fibrillation: Secondary | ICD-10-CM

## 2017-06-28 DIAGNOSIS — Z01812 Encounter for preprocedural laboratory examination: Secondary | ICD-10-CM

## 2017-06-28 NOTE — Telephone Encounter (Signed)
Scheduled AFib ablation for 10/12 Pre procedure labs to be completed on 10/1 through [facility]. Aware that cardiac CT will need to be completed within 7 days prior to procedure and office will call him to schedule once we pre certify with insurance. Letter of instructions reviewed and mailed to home address. Post ablation follow up appts made/recall placed in EPIC. Wife verbalized understanding and agreeable to plan.

## 2017-06-29 ENCOUNTER — Telehealth: Payer: Self-pay | Admitting: Cardiology

## 2017-06-29 NOTE — Telephone Encounter (Signed)
Will forward to Vida Roller to address on Mon

## 2017-06-29 NOTE — Telephone Encounter (Signed)
°  New Prob  Has some questions regarding upcoming scheduled appointments. Please call.

## 2017-07-02 ENCOUNTER — Encounter: Payer: Self-pay | Admitting: *Deleted

## 2017-07-02 NOTE — Telephone Encounter (Signed)
Asking to resend letters, created Friday, to MyChart.  Says one minute they could see them and the next they could not. Resent via MyChart per request.

## 2017-07-10 ENCOUNTER — Encounter (HOSPITAL_COMMUNITY): Payer: Self-pay | Admitting: Cardiology

## 2017-07-10 ENCOUNTER — Ambulatory Visit (HOSPITAL_COMMUNITY)
Admission: RE | Admit: 2017-07-10 | Discharge: 2017-07-10 | Disposition: A | Discharge status: Home / Self Care | Source: Ambulatory Visit | Attending: Cardiology | Admitting: Cardiology

## 2017-07-10 VITALS — BP 102/68 | HR 76 | Wt 254.8 lb

## 2017-07-10 DIAGNOSIS — I2510 Atherosclerotic heart disease of native coronary artery without angina pectoris: Secondary | ICD-10-CM | POA: Diagnosis not present

## 2017-07-10 DIAGNOSIS — Z7901 Long term (current) use of anticoagulants: Secondary | ICD-10-CM | POA: Insufficient documentation

## 2017-07-10 DIAGNOSIS — I255 Ischemic cardiomyopathy: Secondary | ICD-10-CM | POA: Diagnosis not present

## 2017-07-10 DIAGNOSIS — Z7902 Long term (current) use of antithrombotics/antiplatelets: Secondary | ICD-10-CM | POA: Diagnosis not present

## 2017-07-10 DIAGNOSIS — I429 Cardiomyopathy, unspecified: Secondary | ICD-10-CM | POA: Diagnosis not present

## 2017-07-10 DIAGNOSIS — I480 Paroxysmal atrial fibrillation: Secondary | ICD-10-CM | POA: Insufficient documentation

## 2017-07-10 DIAGNOSIS — I340 Nonrheumatic mitral (valve) insufficiency: Secondary | ICD-10-CM | POA: Insufficient documentation

## 2017-07-10 DIAGNOSIS — I5022 Chronic systolic (congestive) heart failure: Secondary | ICD-10-CM | POA: Diagnosis not present

## 2017-07-10 DIAGNOSIS — R000 Tachycardia, unspecified: Secondary | ICD-10-CM | POA: Insufficient documentation

## 2017-07-10 DIAGNOSIS — Z955 Presence of coronary angioplasty implant and graft: Secondary | ICD-10-CM | POA: Insufficient documentation

## 2017-07-10 MED ORDER — SPIRONOLACTONE 25 MG PO TABS: 25.0000 mg | ORAL_TABLET | Freq: Every day | ORAL | 3 refills | Status: DC

## 2017-07-10 NOTE — Patient Instructions (Signed)
Increase Spironolactone 25 mg (1 tab) every night   Your physician recommends that you return for lab work in: 10-14 days Rx given to have done at PCP and faxed  Your physician has requested that you have an echocardiogram. Echocardiography is a painless test that uses sound waves to create images of your heart. It provides your doctor with information about the size and shape of your heart and how well your heart's chambers and valves are working. This procedure takes approximately one hour. There are no restrictions for this procedure.   Your physician recommends that you schedule a follow-up appointment in: [place] D  Your physician recommends that you schedule a follow-up appointment in: 2 months with echocardiogram

## 2017-07-10 NOTE — Progress Notes (Signed)
PCP: Dr. Ermalinda Memos Cardiology: Dr. Wyline Mood HF Cardiology: Dr. Shirlee Latch  61 yo with history of paroxysmal atrial fibrillation, CAD, and chronic systolic CHF was referred by Dr. Wyline Mood for CHF clinic evaluation.    Patient had no cardiac history prior to 2/18.  In 2/18, he developed the onset of exertional dyspnea. After steadily worsening dyspnea, he went to the ER at [facility] and was admitted.  Echo in 2/18 showed EF < 20% with severe MR.  He went back into NSR spontaneously.  He followed up with Dr. Wyline Mood and was set up for a cath in 3/18.  He was found to have a chronically occluded LCx and a long proximal to mid LAD stenosis.  No intervention was done.  He was noted to be back in rapid atrial fibrillation and was admitted.  He was diuresed and started on amiodarone, he converted back to NSR again spontaneously. He is in NSR today.   Underwent successful CTO procedure in 8/18 with DES to LCx, DES to pLAD, DES to dLAD.   Returns today for HF follow up. Overall feeling well, denies SOB. Weights at home 251 pounds. No SOB with walking around the grocery store. Walks quite a bit with his job, climbs stairs with ease. Taking all medications. Denies dizziness, lightheadedness, chest pain. Has not used any SL Nitro. Has not had to take any lasix either. No longer taking ASA per orders.    Labs (4/18): K 4.5, creatinine 1.0 Labs (5/18): K 4.2, creatinine 1.25, LFTs normal, TSH normal Labs (6/18): LDL 14, HDL 28 Labs (8/18): K 4.1 => 3.9, creatinine 1.0 => 1.14, LFTs normal, TSH normal  ECG (9/18, personally reviewed): NSR, normal  PMH: 1. Atrial fibrillation: Paroxysmal.  2. 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  - Echo (2/18) with mild LV dilation, EF < 20%, severe mitral regurgitation.  - RHC/LHC (3/18): long 80% proximal-mid LAD stenosis, total occlusion of the LCx with left to left  collaterals, RCA ok. Mean RA 7, PA 32/10, mean PCWP 16, LVEDP 12, CI 1.54.  - Echo (6/18) with EF 30-35%, diffuse hypokinesis, normal RV size and systolic function, trivial MR.  3. CAD: LHC (3/18) with long 80% proximal-mid LAD stenosis, total occlusion of the LCx with left to left collaterals, RCA ok. - 8/18: CTO intervention with DES to LCx and DES x 2 to proximal and mid LAD.  4. Mitral regurgitation: Severe on 2/18 echo.  Suspect functional, as MR only trivial on 6/18 echo.  5. Sleep study (6/18) with no OSA but nocturnal hypoxemia.   SH: Midwife and school custodian, lives in [place], quit smoking in 2011, married.   FH: Grandfather with MIs, mother with CABG and CHF.   Review of systems complete and found to be negative unless listed in HPI.    Current Outpatient Prescriptions  Medication Sig Dispense Refill  . acetaminophen (TYLENOL) 325 MG tablet Take 650 mg by mouth every 6 (six) hours as needed (FOR HEADACHES/SINUS ISSUES.).    Marland Kitchen amiodarone (PACERONE) 200 MG tablet Take 200 mg by mouth daily.    Marland Kitchen apixaban (ELIQUIS) 5 MG TABS tablet Take 1 tablet (5 mg total) by mouth 2 (two) times daily. 180 tablet 3  . atorvastatin (LIPITOR) 80 MG tablet TAKE 1 TABLET DAILY AT 6 P.M. 90 tablet 1  . clopidogrel (PLAVIX) 75 MG tablet Take 1 tablet (75 mg total) by mouth daily. 90 tablet 3  .  DULoxetine (CYMBALTA) 60 MG capsule Take 1 capsule (60 mg total) by mouth daily. 90 capsule 3  . lisinopril (PRINIVIL,ZESTRIL) 5 MG tablet Take 1 tablet (5 mg total) by mouth daily. 30 tablet 3  . metoprolol succinate (TOPROL XL) 25 MG 24 hr tablet Take 1 tablet (25 mg total) by mouth 2 (two) times daily. 180 tablet 3  . Omega-3 Fatty Acids (FISH OIL) 1000 MG CAPS Take 1,000 mg by mouth every other day.     . spironolactone (ALDACTONE) 25 MG tablet Take 0.5 tablets (12.5 mg total) by mouth at bedtime. 45 tablet 3  . tamsulosin (FLOMAX) 0.4 MG CAPS capsule Take 1 capsule (0.4 mg total) by mouth daily. 90  capsule 3  . zolpidem (AMBIEN) 10 MG tablet Take 5 mg by mouth at bedtime as needed for sleep.     . furosemide (LASIX) 20 MG tablet Take 20 mg by mouth daily as needed (for swelling or weight gain of 3 Lbs.).     Marland Kitchen nitroGLYCERIN (NITROSTAT) 0.4 MG SL tablet Place 1 tablet (0.4 mg total) under the tongue every 5 (five) minutes as needed. (Patient not taking: Reported on 07/10/2017) 25 tablet 2  . potassium chloride (K-DUR,KLOR-CON) 10 MEQ tablet TAKE ONLY ON DAYS WHEN TAKING LASIX (Patient not taking: Reported on 07/10/2017) 90 tablet 3   No current facility-administered medications for this encounter.    BP 102/68   Pulse 76   Wt 254 lb 12.8 oz (115.6 kg)   SpO2 96%   BMI 31.02 kg/m    Wt Readings from Last 3 Encounters:  07/10/17 254 lb 12.8 oz (115.6 kg)  06/25/17 255 lb 3.2 oz (115.8 kg)  06/21/17 256 lb 6.4 oz (116.3 kg)    General: Well appearing. No resp difficulty. HEENT: Normal Neck: Supple. JVP 5-6. Carotids 2+ bilat; no bruits. No thyromegaly or nodule noted. Cor: PMI nondisplaced. RRR, No M/G/R noted Lungs: CTAB, normal effort. Abdomen: Soft, non-tender, non-distended, no HSM. No bruits or masses. +BS  Extremities: No cyanosis, clubbing, rash, R and LLE no edema. Diffuse skin scabs-chigger bites.  Neuro: Alert & orientedx3, cranial nerves grossly intact. moves all 4 extremities w/o difficulty. Affect pleasant   Assessment/Plan: 1. Atrial fibrillation: Paroxysmal.  NSR by exam today.  Plan for Afib ablation on 07/27/17 with Dr. Elberta Fortis.  - Continue Eliquis for anticoagulation.  - Still taking Amio 200 mg daily. Should be able to stop Amio after ablation. He does have some microdeposits on his cornea, his eye doctor said ok to continue Amio for now. Recent TSH and LFTs normal.   2. 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  Echo in 2/18 with EF < 20%.  EF 30-35% in 6/18.  NYHA II. Volume stable on exam.  -  Continue lasix prn.  - Continue Toprol XL 25 mg daily.  - Continue lisinopril 5 mg daily - Increase Spiro to 25 mg hs. BMET in 10-14 days after increase. - Will need repeat Echo 08/2017 (3 months post PCI), if EF remains low, will need referral for ICD. Narrow QRS, not CRT candidate.  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 Hopeful that his EF recovers.   3. CAD: s/p DES to LCx and LAD in 05/2017.  Denies chest pain.  - Continue Plavix and Eliquis, no longer taking ASA as he completed 30 days of triple therapy. Stop Plavix after a year.  -  Continue statin.   4. Sleep study showed nocturnal hypoxemia but no OSA.  - Wearing oxygen at night.   5. Mitral regurgitation: Trivial by last Echo.   Follow up in 2 months with an Echo. BMET in 10-14 days after increased Cleda Daub.   Little Ishikawa, NP  07/10/2017   Patient seen with NP, agree with the above note. He is stable, NYHA class II symptoms with no chest pain.  He is not volume overloaded on exam.  Working full time as a Data processing manager.  - Increase spironolactone to 25 mg daily with BMET in 10 days.  - Followup in 1 month with pharmacist for further medication titration and 2 months with me.  He will need an echo at that time, will need ICD if EF remains low (not CRT candidate).   He remains in NSR on amiodarone.  Will be getting atrial fibrillation ablation soon.  Would like to stop amiodarone eventually after ablation.   Marca Ancona 07/11/2017

## 2017-07-12 ENCOUNTER — Encounter: Payer: Self-pay | Admitting: Cardiology

## 2017-07-18 ENCOUNTER — Other Ambulatory Visit

## 2017-07-18 ENCOUNTER — Other Ambulatory Visit (HOSPITAL_COMMUNITY): Payer: Self-pay | Admitting: *Deleted

## 2017-07-18 DIAGNOSIS — I5022 Chronic systolic (congestive) heart failure: Secondary | ICD-10-CM

## 2017-07-18 LAB — BASIC METABOLIC PANEL
BUN / CREAT RATIO: 24 — AB (ref 9–20)
BUN: 24 mg/dL (ref 6–24)
CO2: 20 mmol/L (ref 20–29)
Calcium: 9.3 mg/dL (ref 8.7–10.2)
Chloride: 105 mmol/L (ref 96–106)
Creatinine, Ser: 1.02 mg/dL (ref 0.76–1.27)
GFR calc Af Amer: 93 mL/min/{1.73_m2} (ref >59)
GFR calc non Af Amer: 80 mL/min/{1.73_m2} (ref >59)
Glucose: 139 mg/dL — ABNORMAL HIGH (ref 65–99)
Potassium: 4.4 mmol/L (ref 3.5–5.2)
SODIUM: 140 mmol/L (ref 134–144)

## 2017-07-20 ENCOUNTER — Ambulatory Visit (HOSPITAL_COMMUNITY): Admission: RE | Admit: 2017-07-20 | Source: Ambulatory Visit

## 2017-07-20 ENCOUNTER — Ambulatory Visit (HOSPITAL_COMMUNITY)
Admission: RE | Admit: 2017-07-20 | Discharge: 2017-07-20 | Disposition: A | Discharge status: Home / Self Care | Source: Ambulatory Visit | Attending: Cardiology | Admitting: Cardiology

## 2017-07-20 ENCOUNTER — Encounter (HOSPITAL_COMMUNITY): Payer: Self-pay

## 2017-07-20 DIAGNOSIS — I700 Atherosclerosis of aorta: Secondary | ICD-10-CM | POA: Insufficient documentation

## 2017-07-20 DIAGNOSIS — I480 Paroxysmal atrial fibrillation: Secondary | ICD-10-CM | POA: Diagnosis present

## 2017-07-20 DIAGNOSIS — I517 Cardiomegaly: Secondary | ICD-10-CM | POA: Insufficient documentation

## 2017-07-20 DIAGNOSIS — Z959 Presence of cardiac and vascular implant and graft, unspecified: Secondary | ICD-10-CM | POA: Insufficient documentation

## 2017-07-20 DIAGNOSIS — R911 Solitary pulmonary nodule: Secondary | ICD-10-CM | POA: Insufficient documentation

## 2017-07-20 DIAGNOSIS — I4891 Unspecified atrial fibrillation: Secondary | ICD-10-CM | POA: Diagnosis not present

## 2017-07-20 MED ORDER — IOPAMIDOL (ISOVUE-370) INJECTION 76%
80.0000 mL | Freq: Once | INTRAVENOUS | Status: AC | PRN
  Administered 2017-07-20: 80 mL via INTRAVENOUS

## 2017-07-20 MED ORDER — IOPAMIDOL (ISOVUE-370) INJECTION 76%
INTRAVENOUS | Status: AC
  Filled 2017-07-20: qty 100

## 2017-07-24 ENCOUNTER — Other Ambulatory Visit (HOSPITAL_COMMUNITY): Payer: Self-pay | Admitting: *Deleted

## 2017-07-24 MED ORDER — LISINOPRIL 5 MG PO TABS: 5.0000 mg | ORAL_TABLET | Freq: Every day | ORAL | 3 refills | Status: DC

## 2017-07-27 ENCOUNTER — Encounter (HOSPITAL_COMMUNITY): Payer: Self-pay | Admitting: Certified Registered"

## 2017-07-27 ENCOUNTER — Ambulatory Visit (HOSPITAL_COMMUNITY)
Admission: RE | Admit: 2017-07-27 | Discharge: 2017-07-28 | Disposition: A | Discharge status: Home / Self Care | Source: Ambulatory Visit | Attending: Cardiology | Admitting: Cardiology

## 2017-07-27 ENCOUNTER — Encounter (HOSPITAL_COMMUNITY)
Admission: RE | Disposition: A | Discharge status: Home / Self Care | Payer: Self-pay | Source: Ambulatory Visit | Attending: Cardiology

## 2017-07-27 ENCOUNTER — Ambulatory Visit (HOSPITAL_COMMUNITY): Admitting: Certified Registered"

## 2017-07-27 DIAGNOSIS — Z7983 Long term (current) use of bisphosphonates: Secondary | ICD-10-CM | POA: Insufficient documentation

## 2017-07-27 DIAGNOSIS — I5022 Chronic systolic (congestive) heart failure: Secondary | ICD-10-CM | POA: Insufficient documentation

## 2017-07-27 DIAGNOSIS — Z7902 Long term (current) use of antithrombotics/antiplatelets: Secondary | ICD-10-CM | POA: Diagnosis not present

## 2017-07-27 DIAGNOSIS — I2510 Atherosclerotic heart disease of native coronary artery without angina pectoris: Secondary | ICD-10-CM | POA: Insufficient documentation

## 2017-07-27 DIAGNOSIS — I4891 Unspecified atrial fibrillation: Secondary | ICD-10-CM | POA: Diagnosis present

## 2017-07-27 DIAGNOSIS — I480 Paroxysmal atrial fibrillation: Secondary | ICD-10-CM | POA: Insufficient documentation

## 2017-07-27 DIAGNOSIS — I110 Hypertensive heart disease with heart failure: Secondary | ICD-10-CM | POA: Diagnosis not present

## 2017-07-27 DIAGNOSIS — Z87891 Personal history of nicotine dependence: Secondary | ICD-10-CM | POA: Insufficient documentation

## 2017-07-27 DIAGNOSIS — Z79899 Other long term (current) drug therapy: Secondary | ICD-10-CM | POA: Insufficient documentation

## 2017-07-27 DIAGNOSIS — Z7901 Long term (current) use of anticoagulants: Secondary | ICD-10-CM | POA: Diagnosis not present

## 2017-07-27 HISTORY — PX: ATRIAL FIBRILLATION ABLATION: EP1191

## 2017-07-27 LAB — CBC
HCT: 42.4 % (ref 39.0–52.0)
Hemoglobin: 13.8 g/dL (ref 13.0–17.0)
MCH: 29.6 pg (ref 26.0–34.0)
MCHC: 32.5 g/dL (ref 30.0–36.0)
MCV: 91 fL (ref 78.0–100.0)
PLATELETS: 249 10*3/uL (ref 150–400)
RBC: 4.66 MIL/uL (ref 4.22–5.81)
RDW: 13.9 % (ref 11.5–15.5)
WBC: 5.6 10*3/uL (ref 4.0–10.5)

## 2017-07-27 LAB — POCT ACTIVATED CLOTTING TIME
ACTIVATED CLOTTING TIME: 274 s
ACTIVATED CLOTTING TIME: 323 s
Activated Clotting Time: 296 seconds
Activated Clotting Time: 197 seconds
Activated Clotting Time: 186 seconds

## 2017-07-27 SURGERY — ATRIAL FIBRILLATION ABLATION
Anesthesia: General

## 2017-07-27 MED ORDER — TAMSULOSIN HCL 0.4 MG PO CAPS
0.4000 mg | ORAL_CAPSULE | Freq: Every day | ORAL | Status: DC
Start: 2017-07-27 — End: 2017-07-28
  Administered 2017-07-28: 0.4 mg via ORAL
  Filled 2017-07-27: qty 1

## 2017-07-27 MED ORDER — ACETAMINOPHEN 325 MG PO TABS: 650.0000 mg | ORAL_TABLET | ORAL | Status: DC | PRN

## 2017-07-27 MED ORDER — HEPARIN (PORCINE) IN NACL 2-0.9 UNIT/ML-% IJ SOLN
INTRAMUSCULAR | Status: AC
  Filled 2017-07-27: qty 500

## 2017-07-27 MED ORDER — SODIUM CHLORIDE 0.9 % IV SOLN: 250.0000 mL | INTRAVENOUS | Status: DC | PRN

## 2017-07-27 MED ORDER — HEPARIN SODIUM (PORCINE) 1000 UNIT/ML IJ SOLN
INTRAMUSCULAR | Status: DC | PRN
  Administered 2017-07-27: 2000 [IU] via INTRAVENOUS
  Administered 2017-07-27: 15000 [IU] via INTRAVENOUS
  Administered 2017-07-27: 5000 [IU] via INTRAVENOUS
  Administered 2017-07-27: 4000 [IU] via INTRAVENOUS

## 2017-07-27 MED ORDER — SODIUM CHLORIDE 0.9% FLUSH: 3.0000 mL | INTRAVENOUS | Status: DC | PRN

## 2017-07-27 MED ORDER — FENTANYL CITRATE (PF) 100 MCG/2ML IJ SOLN
INTRAMUSCULAR | Status: DC | PRN
  Administered 2017-07-27 (×4): 50 ug via INTRAVENOUS

## 2017-07-27 MED ORDER — HEPARIN (PORCINE) IN NACL 2-0.9 UNIT/ML-% IJ SOLN
INTRAMUSCULAR | Status: AC | PRN
  Administered 2017-07-27: 2000 mL

## 2017-07-27 MED ORDER — HEPARIN SODIUM (PORCINE) 1000 UNIT/ML IJ SOLN
INTRAMUSCULAR | Status: AC
  Filled 2017-07-27: qty 1

## 2017-07-27 MED ORDER — ONDANSETRON HCL 4 MG/2ML IJ SOLN
INTRAMUSCULAR | Status: DC | PRN
  Administered 2017-07-27: 4 mg via INTRAVENOUS

## 2017-07-27 MED ORDER — EPHEDRINE SULFATE-NACL 50-0.9 MG/10ML-% IV SOSY
PREFILLED_SYRINGE | INTRAVENOUS | Status: DC | PRN
  Administered 2017-07-27: 5 mg via INTRAVENOUS
  Administered 2017-07-27: 10 mg via INTRAVENOUS

## 2017-07-27 MED ORDER — OFF THE BEAT BOOK
Freq: Once | Status: AC
  Administered 2017-07-27: 1
  Filled 2017-07-27: qty 1

## 2017-07-27 MED ORDER — METOPROLOL SUCCINATE ER 25 MG PO TB24
25.0000 mg | ORAL_TABLET | Freq: Two times a day (BID) | ORAL | Status: DC
  Administered 2017-07-27 – 2017-07-28 (×2): 25 mg via ORAL
  Filled 2017-07-27 (×2): qty 1

## 2017-07-27 MED ORDER — SPIRONOLACTONE 25 MG PO TABS
25.0000 mg | ORAL_TABLET | Freq: Every day | ORAL | Status: DC
  Administered 2017-07-27: 25 mg via ORAL
  Filled 2017-07-27: qty 1

## 2017-07-27 MED ORDER — FENTANYL CITRATE (PF) 100 MCG/2ML IJ SOLN: 25.0000 ug | INTRAMUSCULAR | Status: DC | PRN

## 2017-07-27 MED ORDER — BUPIVACAINE HCL (PF) 0.25 % IJ SOLN
INTRAMUSCULAR | Status: DC | PRN
  Administered 2017-07-27: 30 mL

## 2017-07-27 MED ORDER — DOBUTAMINE IN D5W 4-5 MG/ML-% IV SOLN
INTRAVENOUS | Status: DC | PRN
  Administered 2017-07-27: 20 ug/kg/min via INTRAVENOUS

## 2017-07-27 MED ORDER — HEPARIN SODIUM (PORCINE) 1000 UNIT/ML IJ SOLN
INTRAMUSCULAR | Status: DC | PRN
  Administered 2017-07-27: 1000 [IU] via INTRAVENOUS

## 2017-07-27 MED ORDER — YOU HAVE A PACEMAKER BOOK
Freq: Once | Status: DC
  Filled 2017-07-27: qty 1

## 2017-07-27 MED ORDER — ACETAMINOPHEN 325 MG PO TABS: 650.0000 mg | ORAL_TABLET | Freq: Four times a day (QID) | ORAL | Status: DC | PRN

## 2017-07-27 MED ORDER — FUROSEMIDE 20 MG PO TABS: 20.0000 mg | ORAL_TABLET | Freq: Every day | ORAL | Status: DC | PRN

## 2017-07-27 MED ORDER — APIXABAN 5 MG PO TABS
5.0000 mg | ORAL_TABLET | Freq: Two times a day (BID) | ORAL | Status: DC
  Administered 2017-07-27 – 2017-07-28 (×2): 5 mg via ORAL
  Filled 2017-07-27 (×2): qty 1

## 2017-07-27 MED ORDER — SODIUM CHLORIDE 0.9% FLUSH
3.0000 mL | Freq: Two times a day (BID) | INTRAVENOUS | Status: DC
  Administered 2017-07-27: 3 mL via INTRAVENOUS

## 2017-07-27 MED ORDER — LIDOCAINE 2% (20 MG/ML) 5 ML SYRINGE
INTRAMUSCULAR | Status: DC | PRN
  Administered 2017-07-27: 40 mg via INTRAVENOUS

## 2017-07-27 MED ORDER — ATORVASTATIN CALCIUM 40 MG PO TABS
40.0000 mg | ORAL_TABLET | Freq: Every day | ORAL | Status: DC
Start: 2017-07-27 — End: 2017-07-28
  Administered 2017-07-27: 40 mg via ORAL
  Filled 2017-07-27: qty 1

## 2017-07-27 MED ORDER — ONDANSETRON HCL 4 MG/2ML IJ SOLN: 4.0000 mg | Freq: Four times a day (QID) | INTRAMUSCULAR | Status: DC | PRN

## 2017-07-27 MED ORDER — PROTAMINE SULFATE 10 MG/ML IV SOLN
INTRAVENOUS | Status: DC | PRN
  Administered 2017-07-27: 15 mg via INTRAVENOUS
  Administered 2017-07-27 (×2): 10 mg via INTRAVENOUS
  Administered 2017-07-27: 5 mg via INTRAVENOUS

## 2017-07-27 MED ORDER — ROCURONIUM BROMIDE 10 MG/ML (PF) SYRINGE
PREFILLED_SYRINGE | INTRAVENOUS | Status: DC | PRN
  Administered 2017-07-27: 50 mg via INTRAVENOUS

## 2017-07-27 MED ORDER — ZOLPIDEM TARTRATE 5 MG PO TABS
5.0000 mg | ORAL_TABLET | Freq: Every evening | ORAL | Status: DC | PRN
  Administered 2017-07-27: 5 mg via ORAL
  Filled 2017-07-27: qty 1

## 2017-07-27 MED ORDER — NITROGLYCERIN 0.4 MG SL SUBL: 0.4000 mg | SUBLINGUAL_TABLET | SUBLINGUAL | Status: DC | PRN

## 2017-07-27 MED ORDER — PHENYLEPHRINE 40 MCG/ML (10ML) SYRINGE FOR IV PUSH (FOR BLOOD PRESSURE SUPPORT)
PREFILLED_SYRINGE | INTRAVENOUS | Status: DC | PRN
  Administered 2017-07-27: 80 ug via INTRAVENOUS
  Administered 2017-07-27 (×2): 40 ug via INTRAVENOUS
  Administered 2017-07-27 (×4): 80 ug via INTRAVENOUS

## 2017-07-27 MED ORDER — AMIODARONE HCL 200 MG PO TABS
200.0000 mg | ORAL_TABLET | Freq: Every day | ORAL | Status: DC
Start: 2017-07-27 — End: 2017-07-28
  Administered 2017-07-27 – 2017-07-28 (×2): 200 mg via ORAL
  Filled 2017-07-27 (×2): qty 1

## 2017-07-27 MED ORDER — LACTATED RINGERS IV SOLN
INTRAVENOUS | Status: DC | PRN
  Administered 2017-07-27 (×2): via INTRAVENOUS

## 2017-07-27 MED ORDER — MIDAZOLAM HCL 5 MG/5ML IJ SOLN
INTRAMUSCULAR | Status: DC | PRN
  Administered 2017-07-27 (×2): 1 mg via INTRAVENOUS

## 2017-07-27 MED ORDER — ONDANSETRON HCL 4 MG/2ML IJ SOLN: 4.0000 mg | Freq: Once | INTRAMUSCULAR | Status: DC | PRN

## 2017-07-27 MED ORDER — BUPIVACAINE HCL (PF) 0.25 % IJ SOLN
INTRAMUSCULAR | Status: AC
  Filled 2017-07-27: qty 30

## 2017-07-27 MED ORDER — PROPOFOL 10 MG/ML IV BOLUS
INTRAVENOUS | Status: DC | PRN
  Administered 2017-07-27: 180 mg via INTRAVENOUS

## 2017-07-27 MED ORDER — LISINOPRIL 5 MG PO TABS
5.0000 mg | ORAL_TABLET | Freq: Every day | ORAL | Status: DC
  Administered 2017-07-27 – 2017-07-28 (×2): 5 mg via ORAL
  Filled 2017-07-27 (×2): qty 1

## 2017-07-27 MED ORDER — CLOPIDOGREL BISULFATE 75 MG PO TABS
75.0000 mg | ORAL_TABLET | Freq: Every day | ORAL | Status: DC
  Administered 2017-07-28: 75 mg via ORAL
  Filled 2017-07-27: qty 1

## 2017-07-27 MED ORDER — DULOXETINE HCL 60 MG PO CPEP
60.0000 mg | ORAL_CAPSULE | Freq: Every day | ORAL | Status: DC
  Administered 2017-07-27: 60 mg via ORAL
  Filled 2017-07-27 (×2): qty 1

## 2017-07-27 MED ORDER — SUGAMMADEX SODIUM 200 MG/2ML IV SOLN
INTRAVENOUS | Status: DC | PRN
  Administered 2017-07-27: 150 mg via INTRAVENOUS

## 2017-07-27 MED ORDER — DOBUTAMINE IN D5W 4-5 MG/ML-% IV SOLN
INTRAVENOUS | Status: AC
Start: 2017-07-27 — End: ?
  Filled 2017-07-27: qty 250

## 2017-07-27 MED ORDER — PHENYLEPHRINE HCL 10 MG/ML IJ SOLN
INTRAVENOUS | Status: DC | PRN
  Administered 2017-07-27: 30 ug/min via INTRAVENOUS

## 2017-07-27 MED ORDER — DEXAMETHASONE SODIUM PHOSPHATE 4 MG/ML IJ SOLN
INTRAMUSCULAR | Status: DC | PRN
  Administered 2017-07-27: 8 mg via INTRAVENOUS

## 2017-07-27 SURGICAL SUPPLY — 20 items
BAG SNAP BAND KOVER 36X36 (MISCELLANEOUS) ×2 IMPLANT
BLANKET WARM UNDERBOD FULL ACC (MISCELLANEOUS) ×2 IMPLANT
CATH SMTCH THERMOCOOL SF DF (CATHETERS) ×2 IMPLANT
CATH SOUNDSTAR 3D IMAGING (CATHETERS) ×2 IMPLANT
CATH VARIABLE LASSO NAV 2515 (CATHETERS) ×2 IMPLANT
CATH WEBSTER BI DIR CS D-F CRV (CATHETERS) ×2 IMPLANT
COVER SWIFTLINK CONNECTOR (BAG) ×2 IMPLANT
HOVERMATT SINGLE USE (MISCELLANEOUS) ×2 IMPLANT
NEEDLE TRANSSEPTAL BRK 98CM (NEEDLE) ×2 IMPLANT
NEEDLE TRANSSEPTAL BRK XS 98CM (NEEDLE) ×2 IMPLANT
PACK EP LATEX FREE (CUSTOM PROCEDURE TRAY) ×1
PACK EP LF (CUSTOM PROCEDURE TRAY) ×1 IMPLANT
PAD DEFIB LIFELINK (PAD) ×2 IMPLANT
PATCH CARTO3 (PAD) ×2 IMPLANT
SHEATH AGILIS NXT 8.5F 71CM (SHEATH) ×4 IMPLANT
SHEATH AVANTI 11F 11CM (SHEATH) ×2 IMPLANT
SHEATH PINNACLE 7F 10CM (SHEATH) ×2 IMPLANT
SHEATH PINNACLE 8F 10CM (SHEATH) ×4 IMPLANT
SHEATH PINNACLE 9F 10CM (SHEATH) ×4 IMPLANT
TUBING SMART ABLATE COOLFLOW (TUBING) ×2 IMPLANT

## 2017-07-27 NOTE — Care Management Note (Signed)
Case Management Note  Patient Details  Name: ZYSHAWN EICHINGER MRN: [id number] Date of Birth: Dec 29, 1955  Subjective/Objective:  From home ,s/p afib ablation, already been taking eliquis.                   Action/Plan: NCM will follow for dc needs.   Expected Discharge Date:                  Expected Discharge Plan:  Home/Self Care  In-House Referral:     Discharge planning Services  CM Consult  Post Acute Care Choice:    Choice offered to:     DME Arranged:    DME Agency:     HH Arranged:    HH Agency:     Status of Service:  Completed, signed off  If discussed at [employer] of Stay Meetings, dates discussed:    Additional Comments:  Leone Haven, RN 07/27/2017, 6:00 PM

## 2017-07-27 NOTE — Transfer of Care (Signed)
Immediate Anesthesia Transfer of Care Note  Patient: [name]  Procedure(s) Performed: Atrial Fibrillation Ablation (N/A )  Patient Location: Cath Lab  Anesthesia Type:General  Level of Consciousness: awake and patient cooperative  Airway & Oxygen Therapy: Patient Spontanous Breathing and Patient connected to nasal cannula oxygen  Post-op Assessment: Report given to RN and Post -op Vital signs reviewed and stable  Post vital signs: Reviewed and stable  Last Vitals:  Vitals:   07/27/17 0947 07/27/17 1451  BP: 124/82   Pulse: (!) 58   Temp: 36.6 C 36.5 C  SpO2: 98%     Last Pain:  Vitals:   07/27/17 1451  TempSrc: Temporal      Patients Stated Pain Goal: 3 (07/27/17 1059)  Complications: No apparent anesthesia complications

## 2017-07-27 NOTE — Progress Notes (Addendum)
Site area: LFV x2 RFV x2 Site Prior to Removal:  Level 0/0 Pressure Applied For:28min/25 min Manual:   yes Patient Status During Pull: stable  Post Pull Site:  Level 0 Post Pull Instructions Given: yes  Post Pull Pulses Present: palpable Dressing Applied:  tegaderm Bedrest begins @ 1630 till 2230 Comments: canderson / Aram Beecham

## 2017-07-27 NOTE — Anesthesia Preprocedure Evaluation (Addendum)
Anesthesia Evaluation  Patient identified by MRN, date of birth, ID band Patient awake    Reviewed: Allergy & Precautions, NPO status , Patient's Chart, lab work & pertinent test results  Airway Mallampati: II  TM Distance: >3 FB Neck ROM: Full    Dental  (+) Teeth Intact, Dental Advisory Given   Pulmonary former smoker,    breath sounds clear to auscultation       Cardiovascular  Rhythm:Regular Rate:Normal     Neuro/Psych    GI/Hepatic   Endo/Other    Renal/GU      Musculoskeletal   Abdominal (+) + obese,   Peds  Hematology   Anesthesia Other Findings   Reproductive/Obstetrics                            Anesthesia Physical Anesthesia Plan  ASA: III  Anesthesia Plan: General   Post-op Pain Management:    Induction: Intravenous  PONV Risk Score and Plan: Ondansetron and Dexamethasone  Airway Management Planned: Oral ETT  Additional Equipment:   Intra-op Plan:   Post-operative Plan: Extubation in OR  Informed Consent: I have reviewed the patients History and Physical, chart, labs and discussed the procedure including the risks, benefits and alternatives for the proposed anesthesia with the patient or authorized representative who has indicated his/her understanding and acceptance.     Dental advisory given  Plan Discussed with: CRNA and Anesthesiologist  Anesthesia Plan Comments:         Anesthesia Quick Evaluation  

## 2017-07-27 NOTE — H&P (Signed)
Kevin Oconnell is a 61 y.o. male with a history of CAD, CHF and atrial fibrillation. He presents today for ablation. He has been compliant with his anticoagulation. On exam, RRR, no murmurs, lungs clear. Risks and benefits discussed. Risks include but not limited to bleeding, tamponade, heart block, stroke, and damage to surround organs. The patient understands the risks and has agreed to the procedure.  [name], MD 07/27/2017 10:55 AM

## 2017-07-27 NOTE — Anesthesia Procedure Notes (Signed)
Procedure Name: Intubation Date/Time: 07/27/2017 11:54 AM Performed by: Julian Reil Pre-anesthesia Checklist: Patient identified, Emergency Drugs available, Suction available, Patient being monitored and Timeout performed Patient Re-evaluated:Patient Re-evaluated prior to induction Oxygen Delivery Method: Circle system utilized Preoxygenation: Pre-oxygenation with 100% oxygen Induction Type: IV induction Ventilation: Mask ventilation without difficulty and Oral airway inserted - appropriate to patient size Laryngoscope Size: Glidescope and 4 Grade View: Grade I Tube type: Oral Tube size: 7.5 mm Number of attempts: 2 Airway Equipment and Method: Stylet and Video-laryngoscopy Placement Confirmation: ETT inserted through vocal cords under direct vision,  positive ETCO2 and breath sounds checked- equal and bilateral Secured at: 23 cm Tube secured with: Tape Dental Injury: Teeth and Oropharynx as per pre-operative assessment  Comments: 1st DL Miller 3 grade 4 view, large floppy epiglottis, 2nd DL Glidescope 4 grade 1 view.  4x4s bite block used.

## 2017-07-28 DIAGNOSIS — I110 Hypertensive heart disease with heart failure: Secondary | ICD-10-CM | POA: Diagnosis not present

## 2017-07-28 DIAGNOSIS — I480 Paroxysmal atrial fibrillation: Secondary | ICD-10-CM

## 2017-07-28 DIAGNOSIS — I5022 Chronic systolic (congestive) heart failure: Secondary | ICD-10-CM | POA: Diagnosis not present

## 2017-07-28 DIAGNOSIS — I2510 Atherosclerotic heart disease of native coronary artery without angina pectoris: Secondary | ICD-10-CM | POA: Diagnosis not present

## 2017-07-28 MED ORDER — PANTOPRAZOLE SODIUM 40 MG PO TBEC: 40.0000 mg | DELAYED_RELEASE_TABLET | Freq: Every day | ORAL | 0 refills | Status: DC

## 2017-07-28 NOTE — Discharge Instructions (Signed)
No driving for 4 days. No lifting over 5 lbs for 1 week. No vigorous or sexual activity for 1 week. You may return to work on 08/03/17. Keep procedure site clean & dry. If you notice increased pain, swelling, bleeding or pus, call/return!  You may shower, but no soaking baths/hot tubs/pools for 1 week.  ° °Information on my medicine - ELIQUIS® (apixaban) ° °This medication education was reviewed with me or my healthcare representative as part of my discharge preparation.  The pharmacist that spoke with me during my hospital stay was:  Lequita Meadowcroft, RPH ° °Why was Eliquis® prescribed for you? °Eliquis® was prescribed for you to reduce the risk of forming blood clots that can cause a stroke if you have a medical condition called atrial fibrillation (a type of irregular heartbeat) OR to reduce the risk of a blood clots forming after orthopedic surgery. ° °What do You need to know about Eliquis® ? °Take your Eliquis® TWICE DAILY - one tablet in the morning and one tablet in the evening with or without food.  It would be best to take the doses about the same time each day. ° °If you have difficulty swallowing the tablet whole please discuss with your pharmacist how to take the medication safely. ° °Take Eliquis® exactly as prescribed by your doctor and DO NOT stop taking Eliquis® without talking to the doctor who prescribed the medication.  Stopping may increase your risk of developing a new clot or stroke.  Refill your prescription before you run out. ° °After discharge, you should have regular check-up appointments with your healthcare provider that is prescribing your Eliquis®.  In the future your dose may need to be changed if your kidney function or weight changes by a significant amount or as you get older. ° °What do you do if you miss a dose? °If you miss a dose, take it as soon as you remember on the same day and resume taking twice daily.  Do not take more than one dose of ELIQUIS at the same  time. ° °Important Safety Information °A possible side effect of Eliquis® is bleeding. You should call your healthcare provider right away if you experience any of the following: °? Bleeding from an injury or your nose that does not stop. °? Unusual colored urine (red or dark brown) or unusual colored stools (red or black). °? Unusual bruising for unknown reasons. °? A serious fall or if you hit your head (even if there is no bleeding). ° °Some medicines may interact with Eliquis® and might increase your risk of bleeding or clotting while on Eliquis®. To help avoid this, consult your healthcare provider or pharmacist prior to using any new prescription or non-prescription medications, including herbals, vitamins, non-steroidal anti-inflammatory drugs (NSAIDs) and supplements. ° °This website has more information on Eliquis® (apixaban): www.Eliquis.com. ° ° ° ° ° °

## 2017-07-28 NOTE — Discharge Summary (Signed)
ELECTROPHYSIOLOGY PROCEDURE DISCHARGE SUMMARY    Patient ID: Kevin Oconnell,  MRN: [id number], DOB/AGE: 1956-08-06 61 y.o.  Admit date: 07/27/2017 Discharge date: 07/28/2017   Primary Cardiologist: Dr. Wyline Mood Dr. Shirlee Latch Electrophysiologist: Dr. Elberta Fortis  Primary Discharge Diagnosis:  Paroxysmal Atrial FIbrillation  Secondary Discharge Diagnosis:  Chronic Systolic CHF CAD  Procedures This Admission:  1.  Electrophysiology study and radiofrequency catheter ablation on 07/27/2017 by Dr. Elberta Fortis. The patient was in NSR upon presentation. He underwent successful isolation and anatomical encircling of all four pulmonary veins with radiofrequency current. No inducible arrhythmias following ablation both on and off of dobutamine were noted.   Brief HPI: Kevin Oconnell is a 61 y.o. male with a history of paroxysmal atrial fibrillation (on Eliquis), CAD (s/p DES to LCx and LAD in 05/2017), chronic systolic CHF (EF 40-98% by echo in 03/2017), HTN, MR, and asthma. They have failed medical therapy with Amiodarone. Risks, benefits, and alternatives to catheter ablation of atrial fibrillation were reviewed with the patient who wished to proceed.  Hospital Course:  The patient was admitted and underwent EPS/RFCA of atrial fibrillation with details as outlined above.  They were monitored on telemetry overnight which demonstrated NSR, HR in 70's - 80's with occasional PVC's.  Groin was without complication on the day of discharge.  The patient was examined and considered to be stable for discharge.  Wound care and restrictions were reviewed with the patient.  The patient will be seen back by Rudi Coco, NP in 4 weeks and Dr. Elberta Fortis in 12 weeks for post ablation follow up.   This patients CHA2DS2-VASc Score and unadjusted Ischemic Stroke Rate (% per year) is equal to 3.2 % stroke rate/year from a score of 3 (CHF, HTN, Vascular)   Physical Exam: Vitals:   07/28/17 0215 07/28/17 0300  07/28/17 0615 07/28/17 0717  BP: 98/64  102/65 105/65  Pulse: 74  73 75  Resp: Temp: 97.8 F (36.6 C)  98.4 F (36.9 C) 97.9 F (36.6 C)  TempSrc: Oral  Oral Oral  SpO2: 96%  97% 92%  Weight:  252 lb 13.9 oz (114.7 kg)    Height:   (1.93 m)      GEN- The patient is well appearing, alert and oriented x 3 today.   HEENT: normocephalic, atraumatic; sclera clear, conjunctiva pink; hearing intact; oropharynx clear; neck supple  Lungs- Clear to ausculation bilaterally, normal work of breathing.  No wheezes, rales, rhonchi Heart- Regular rate and rhythm, no murmurs, rubs or gallops  GI- soft, non-tender, non-distended, bowel sounds present  Extremities- no clubbing, cyanosis, or edema; DP/PT/radial pulses 2+ bilaterally, groin without hematoma/bruit MS- no significant deformity or atrophy Skin- warm and dry, no rash or lesion Psych- euthymic mood, full affect Neuro- strength and sensation are intact   Labs:   Lab Results  Component Value Date   WBC 5.6 07/27/2017   HGB 13.8 07/27/2017   HCT 42.4 07/27/2017   MCV 91.0 07/27/2017   PLT 249 07/27/2017   No results for input(s): NA, K, CL, CO2, BUN, CREATININE, CALCIUM, PROT, BILITOT, ALKPHOS, ALT, AST, GLUCOSE in the last 168 hours.  Invalid input(s): LABALBU   Discharge Medications:  Allergies as of 07/28/2017   No Known Allergies     Medication List    TAKE these medications   acetaminophen 325 MG tablet Commonly known as:  TYLENOL Take 650 mg by mouth every 6 (six) hours as needed (FOR HEADACHES/SINUS ISSUES.).  amiodarone 200 MG tablet Commonly known as:  PACERONE Take 200 mg by mouth daily.   apixaban 5 MG Tabs tablet Commonly known as:  ELIQUIS Take 1 tablet (5 mg total) by mouth 2 (two) times daily.   atorvastatin 80 MG tablet Commonly known as:  LIPITOR TAKE 1 TABLET DAILY AT 6 P.M.   clopidogrel 75 MG tablet Commonly known as:  PLAVIX Take 1 tablet (75 mg total) by mouth daily.     DULoxetine 60 MG capsule Commonly known as:  CYMBALTA Take 1 capsule (60 mg total) by mouth daily.   Fish Oil 1000 MG Caps Take 1,000 mg by mouth every other day.   furosemide 20 MG tablet Commonly known as:  LASIX Take 20 mg by mouth daily as needed (for swelling or weight gain of 3 Lbs.).   lisinopril 5 MG tablet Commonly known as:  PRINIVIL,ZESTRIL Take 5 mg by mouth daily.   metoprolol succinate 25 MG 24 hr tablet Commonly known as:  TOPROL XL Take 1 tablet (25 mg total) by mouth 2 (two) times daily.   nitroGLYCERIN 0.4 MG SL tablet Commonly known as:  NITROSTAT Place 1 tablet (0.4 mg total) under the tongue every 5 (five) minutes as needed.   pantoprazole 40 MG tablet Commonly known as:  PROTONIX Take 1 tablet (40 mg total) by mouth daily.   potassium chloride 10 MEQ tablet Commonly known as:  K-DUR,KLOR-CON TAKE ONLY ON DAYS WHEN TAKING LASIX What changed:  how much to take  how to take this  when to take this  additional instructions   spironolactone 25 MG tablet Commonly known as:  ALDACTONE Take 1 tablet (25 mg total) by mouth at bedtime.   tamsulosin 0.4 MG Caps capsule Commonly known as:  FLOMAX Take 1 capsule (0.4 mg total) by mouth daily.   zolpidem 10 MG tablet Commonly known as:  AMBIEN Take 5 mg by mouth at bedtime as needed for sleep.       Disposition:   Follow-up Information    [facility] Follow up on 08/29/2017.   Specialty:  Cardiology Why:  10:00AM Contact information: [address] [id number] [facility] [address] [place] [address] [phone]       Regan Lemming, MD Follow up on 10/29/2017.   Specialty:  Cardiology Why:  10:45AM  Contact information: [address] [place] [address] [phone]        Laurey Morale, MD Follow up on 09/17/2017.   Specialty:  Cardiology Why:  12:00PM (noon) Contact information: [address] [place]  [place] [address] [phone]           Duration of Discharge Encounter: Greater than 30 minutes including physician time.  SignedHillis Range, PA-C 07/28/2017, 9:31 AM    I have seen, examined the patient, and reviewed the above assessment and plan.  Changes to above are made where necessary.  On exam, RRR.  No hematoma.  No concerns. Routine discharge and follow-up.  Co Sign: Hillis Range, MD 07/28/2017 9:32 AM

## 2017-07-28 NOTE — Anesthesia Postprocedure Evaluation (Signed)
Anesthesia Post Note  Patient: Kevin Oconnell  Procedure(s) Performed: Atrial Fibrillation Ablation (N/A )     Patient location during evaluation: Cath Lab Anesthesia Type: General Level of consciousness: awake, awake and alert and oriented Pain management: pain level controlled Vital Signs Assessment: post-procedure vital signs reviewed and stable Respiratory status: spontaneous breathing, nonlabored ventilation and respiratory function stable Cardiovascular status: blood pressure returned to baseline Anesthetic complications: no    Last Vitals:  Vitals:   07/28/17 0615 07/28/17 0717  BP: 102/65 105/65  Pulse: 73 75  Resp: 17 17  Temp: 36.9 C 36.6 C  SpO2: 97% 92%    Last Pain:  Vitals:   07/28/17 0717  TempSrc: Oral                 Nikolaos Maddocks COKER

## 2017-07-28 NOTE — Anesthesia Postprocedure Evaluation (Signed)
Anesthesia Post Note  Patient: Kevin Oconnell  Procedure(s) Performed: Atrial Fibrillation Ablation (N/A )     Anesthesia Post Evaluation  Last Vitals:  Vitals:   07/28/17 0615 07/28/17 0717  BP: 102/65 105/65  Pulse: 73 75  Resp: 17 17  Temp: 36.9 C 36.6 C  SpO2: 97% 92%    Last Pain:  Vitals:   07/28/17 0717  TempSrc: Oral                 Avalee Castrellon COKER

## 2017-08-04 ENCOUNTER — Other Ambulatory Visit: Payer: Self-pay | Admitting: Cardiology

## 2017-08-06 NOTE — Telephone Encounter (Signed)
This is a CHF pt 

## 2017-08-07 ENCOUNTER — Ambulatory Visit (HOSPITAL_COMMUNITY)
Admission: RE | Admit: 2017-08-07 | Discharge: 2017-08-07 | Disposition: A | Discharge status: Home / Self Care | Source: Ambulatory Visit | Attending: Cardiology | Admitting: Cardiology

## 2017-08-07 DIAGNOSIS — R000 Tachycardia, unspecified: Secondary | ICD-10-CM | POA: Insufficient documentation

## 2017-08-07 DIAGNOSIS — G4736 Sleep related hypoventilation in conditions classified elsewhere: Secondary | ICD-10-CM | POA: Diagnosis not present

## 2017-08-07 DIAGNOSIS — R0602 Shortness of breath: Secondary | ICD-10-CM | POA: Diagnosis not present

## 2017-08-07 DIAGNOSIS — I2510 Atherosclerotic heart disease of native coronary artery without angina pectoris: Secondary | ICD-10-CM | POA: Diagnosis not present

## 2017-08-07 DIAGNOSIS — I340 Nonrheumatic mitral (valve) insufficiency: Secondary | ICD-10-CM | POA: Insufficient documentation

## 2017-08-07 DIAGNOSIS — I5022 Chronic systolic (congestive) heart failure: Secondary | ICD-10-CM | POA: Insufficient documentation

## 2017-08-07 DIAGNOSIS — Z9889 Other specified postprocedural states: Secondary | ICD-10-CM | POA: Diagnosis not present

## 2017-08-07 DIAGNOSIS — I480 Paroxysmal atrial fibrillation: Secondary | ICD-10-CM | POA: Diagnosis present

## 2017-08-07 DIAGNOSIS — Z0489 Encounter for examination and observation for other specified reasons: Secondary | ICD-10-CM | POA: Diagnosis not present

## 2017-08-07 NOTE — Patient Instructions (Signed)
It was great to see you today!  Please continue your current medication regimen.   Please keep your appointment with Dr. Shirlee Latch on 09/17/17.

## 2017-08-07 NOTE — Progress Notes (Signed)
HF MD: [facility]   HPI:  61 yo Caucasian M with history of paroxysmal atrial fibrillation, CAD, and chronic systolic CHF was referred by Dr. Wyline Mood for CHF clinic evaluation.    Patient had no cardiac history prior to 2/18.  In 2/18, he developed the onset of exertional dyspnea. After steadily worsening dyspnea, he went to the ER at [facility] and was admitted.  Echo in 2/18 showed EF < 20% with severe MR.  He went back into NSR spontaneously.  He followed up with Dr. Wyline Mood and was set up for a cath in 3/18.  He was found to have a chronically occluded LCx and a long proximal to mid LAD stenosis.  No intervention was done.  He was noted to be back in rapid atrial fibrillation and was admitted.  He was diuresed and started on amiodarone, he converted back to NSR again spontaneously. He is in NSR today.   Underwent successful CTO procedure in 8/18 with DES to LCx, DES to pLAD, DES to dLAD.   Returns today for pharmacist-led HF medication titration. At last HF clinic visit on 07/10/17, increase spironolactone to 25 mg daily. Overall feeling well. He does state that since his ablation last Friday, he has felt more fatigued and slightly more SOB than normal. He also has not slept more than 3 hours a night since the ablation and is unsure why. He denies CP, dizziness and lightheadedness. Weight at home has been stable between 252-254 lb. 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 Has not used any SL Nitro. Has not had to take any lasix either. No longer taking ASA per orders. 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    . Shortness of breath/dyspnea on exertion? Yes - a little more SOB since ablation . Orthopnea/PND? No . Edema? No - usually abdominal but none today . Lightheadedness/dizziness? No . Daily weights at home? Yes - 252 lb  . Blood pressure/heart rate monitoring at  home? No . Following low-sodium/fluid-restricted diet? Yes - appetite has increased  HF Medications: Furosemide 20 mg PO daily PRN swelling/weight gain Lisinopril 5 mg PO daily Metoprolol succinate 25 mg PO BID KCl 10 mEq PO PRN when takes furosemide Spironolactone 25 mg PO QHS  Has the patient been experiencing any side effects to the medications prescribed?  no  Does the patient have any problems obtaining medications due to transportation or finances?   no  Understanding of regimen: good Understanding of indications: good Potential of compliance: good Patient understands to avoid NSAIDs. Patient understands to avoid decongestants.    Pertinent Lab Values: . 07/18/17: Serum creatinine 1.02, BUN 24, Potassium 4.4, Sodium 140  Vital Signs: . Weight: 259 lb (last HF clinic visit weight: 252 lb) . Blood pressure: 102/64 mmHg  . Heart rate: 71 bpm    Assessment: 1. Chronicsystolic CHF (EF 20-25% on 04/04/17), due to ICM/NICM (tachycardia-mediated). NYHA class IIsymptoms.  - Volume status stable, contributes slight weight gain to increased appetite recently. Knows to take furosemide for increased SOB or weight gain >3 lb in 1 day or >5 lb in 1 week  - With soft BP, little room for uptitration with his current medications or switch to Entresto at this time  - Continue furosemide 20 mg PRN weight gain/SOB, KCl 10 mEq PRN with lasix, lisinopril 5 mg  daily, metoprolol succinate 25 mg BID and spironolactone 25 mg daily  - Basic disease state pathophysiology, medication indication, mechanism and side effects reviewed at length with patient and he verbalized understanding 2. Atrial fibrillation: Paroxysmal.    - Afib ablation last Friday by Dr. Elberta Fortisamnitz  - Continue Eliquis for anticoagulation  - Still taking Amio 200 mg daily. He does have some microdeposits on his cornea, his eye doctor said ok to continue Amio for now. Recent TSH and LFTs normal.   - Seeing Rudi CocoDonna Carroll for f/u after  ablation in November   3. CAD: s/p DES to LCx and LAD in 05/2017.    - Denies chest pain  - Continue Plavix and Eliquis, no longer taking ASA as he completed 30 days of triple therapy. Stop Plavix after a year.   - Continue statin 4. Sleep study showed nocturnal hypoxemia but no OSA.   - Wearing oxygen at night  5. Mitral regurgitation:   - Trivial by last Echo  Plan: 1) Medication changes: Based on clinical presentation, vital signs and recent labs will continue current regimen 2) Labs: PRN 3) Follow-up: Dr. Shirlee LatchMcLean on 09/17/17 with ECHO   Tyler DeisErika K. Bonnye FavaNicolsen, PharmD, BCPS, CPP Clinical Pharmacist Pager: [phone] Phone: [phone] 08/07/2017 9:55 AM

## 2017-08-09 ENCOUNTER — Ambulatory Visit (HOSPITAL_COMMUNITY)

## 2017-08-29 ENCOUNTER — Encounter (HOSPITAL_COMMUNITY): Payer: Self-pay | Admitting: Nurse Practitioner

## 2017-08-29 ENCOUNTER — Ambulatory Visit (HOSPITAL_COMMUNITY)
Admission: RE | Admit: 2017-08-29 | Discharge: 2017-08-29 | Disposition: A | Discharge status: Home / Self Care | Source: Ambulatory Visit | Attending: Nurse Practitioner | Admitting: Nurse Practitioner

## 2017-08-29 VITALS — BP 98/62 | HR 84 | Ht 76.0 in | Wt 259.8 lb

## 2017-08-29 DIAGNOSIS — Z79899 Other long term (current) drug therapy: Secondary | ICD-10-CM | POA: Insufficient documentation

## 2017-08-29 DIAGNOSIS — Z7902 Long term (current) use of antithrombotics/antiplatelets: Secondary | ICD-10-CM | POA: Diagnosis not present

## 2017-08-29 DIAGNOSIS — I493 Ventricular premature depolarization: Secondary | ICD-10-CM | POA: Insufficient documentation

## 2017-08-29 DIAGNOSIS — I480 Paroxysmal atrial fibrillation: Secondary | ICD-10-CM | POA: Diagnosis not present

## 2017-08-29 DIAGNOSIS — Z9889 Other specified postprocedural states: Secondary | ICD-10-CM | POA: Insufficient documentation

## 2017-08-29 DIAGNOSIS — N400 Enlarged prostate without lower urinary tract symptoms: Secondary | ICD-10-CM | POA: Insufficient documentation

## 2017-08-29 DIAGNOSIS — Z955 Presence of coronary angioplasty implant and graft: Secondary | ICD-10-CM | POA: Insufficient documentation

## 2017-08-29 DIAGNOSIS — Z8719 Personal history of other diseases of the digestive system: Secondary | ICD-10-CM | POA: Diagnosis not present

## 2017-08-29 DIAGNOSIS — I509 Heart failure, unspecified: Secondary | ICD-10-CM | POA: Diagnosis not present

## 2017-08-29 DIAGNOSIS — Z87891 Personal history of nicotine dependence: Secondary | ICD-10-CM | POA: Insufficient documentation

## 2017-08-29 DIAGNOSIS — I2510 Atherosclerotic heart disease of native coronary artery without angina pectoris: Secondary | ICD-10-CM | POA: Insufficient documentation

## 2017-08-29 DIAGNOSIS — Z7901 Long term (current) use of anticoagulants: Secondary | ICD-10-CM | POA: Diagnosis not present

## 2017-08-29 DIAGNOSIS — I4891 Unspecified atrial fibrillation: Secondary | ICD-10-CM | POA: Diagnosis present

## 2017-08-29 MED ORDER — AMIODARONE HCL 200 MG PO TABS: 200.0000 mg | ORAL_TABLET | Freq: Every day | ORAL | 1 refills | Status: DC
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
## 2017-09-05 ENCOUNTER — Other Ambulatory Visit (HOSPITAL_COMMUNITY)

## 2017-09-12 ENCOUNTER — Telehealth: Payer: Self-pay | Admitting: Cardiology

## 2017-09-12 ENCOUNTER — Encounter: Payer: Self-pay | Admitting: Cardiology

## 2017-09-12 NOTE — Telephone Encounter (Signed)
New message   Pt verbalized that he is calling to speak to rn about rescheduling his procedure

## 2017-09-12 NOTE — Telephone Encounter (Signed)
Wife inquiring why original echo scheduled for 12/3 was cancelled by [facility].   Discussed w/ Melissa and informed wife that this was a mistake.  She was cancelling his Jan appt w/ Camnitz d/t schedule change and did not mean to cancel the echo.  Forwarded Melissa's apology to pt's wife -- wife was understanding and not upset. She appreciates our honesty about it and glad they were able to reschedule for 12/3, same day.

## 2017-09-17 ENCOUNTER — Encounter (HOSPITAL_COMMUNITY): Payer: Self-pay | Admitting: Cardiology

## 2017-09-17 ENCOUNTER — Ambulatory Visit (HOSPITAL_COMMUNITY)
Admission: RE | Admit: 2017-09-17 | Discharge: 2017-09-17 | Disposition: A | Discharge status: Home / Self Care | Source: Ambulatory Visit | Attending: Cardiology | Admitting: Cardiology

## 2017-09-17 ENCOUNTER — Ambulatory Visit (HOSPITAL_COMMUNITY)

## 2017-09-17 VITALS — BP 118/80 | HR 67 | Wt 261.8 lb

## 2017-09-17 DIAGNOSIS — Z955 Presence of coronary angioplasty implant and graft: Secondary | ICD-10-CM | POA: Insufficient documentation

## 2017-09-17 DIAGNOSIS — I2510 Atherosclerotic heart disease of native coronary artery without angina pectoris: Secondary | ICD-10-CM | POA: Diagnosis not present

## 2017-09-17 DIAGNOSIS — I4891 Unspecified atrial fibrillation: Secondary | ICD-10-CM

## 2017-09-17 DIAGNOSIS — I5022 Chronic systolic (congestive) heart failure: Secondary | ICD-10-CM | POA: Diagnosis not present

## 2017-09-17 DIAGNOSIS — Z7902 Long term (current) use of antithrombotics/antiplatelets: Secondary | ICD-10-CM | POA: Insufficient documentation

## 2017-09-17 DIAGNOSIS — I480 Paroxysmal atrial fibrillation: Secondary | ICD-10-CM | POA: Insufficient documentation

## 2017-09-17 DIAGNOSIS — Z79899 Other long term (current) drug therapy: Secondary | ICD-10-CM | POA: Insufficient documentation

## 2017-09-17 DIAGNOSIS — G4734 Idiopathic sleep related nonobstructive alveolar hypoventilation: Secondary | ICD-10-CM | POA: Diagnosis not present

## 2017-09-17 DIAGNOSIS — Z9889 Other specified postprocedural states: Secondary | ICD-10-CM | POA: Insufficient documentation

## 2017-09-17 DIAGNOSIS — Z7901 Long term (current) use of anticoagulants: Secondary | ICD-10-CM | POA: Diagnosis not present

## 2017-09-17 DIAGNOSIS — I340 Nonrheumatic mitral (valve) insufficiency: Secondary | ICD-10-CM | POA: Insufficient documentation

## 2017-09-17 MED ORDER — SACUBITRIL-VALSARTAN 24-26 MG PO TABS
1.0000 | ORAL_TABLET | Freq: Two times a day (BID) | ORAL | 3 refills | Status: DC
Start: 2017-09-19 — End: 2017-10-05

## 2017-09-17 NOTE — Progress Notes (Signed)
PCP: Dr. Ermalinda Memos Cardiology: Dr. Wyline Mood HF Cardiology: Dr. Shirlee Latch  61 y.o.with history of paroxysmal atrial fibrillation, CAD, and chronic systolic CHF was referred by Dr. Wyline Mood for CHF clinic evaluation.    Patient had no cardiac history prior to 2/18.  In 2/18, he developed the onset of exertional dyspnea. After steadily worsening dyspnea, he went to the ER at [facility] and was admitted.  Echo in 2/18 showed EF < 20% with severe MR.  He went back into NSR spontaneously.  He followed up with Dr. Wyline Mood and was set up for a cath in 3/18.  He was found to have a chronically occluded LCx and a long proximal to mid LAD stenosis.  No intervention was done.  He was noted to be back in rapid atrial fibrillation and was admitted.  He was diuresed and started on amiodarone, he converted back to NSR again spontaneously. He is in NSR today.   Underwent successful CTO procedure in 8/18 with DES to LCx, DES to pLAD, DES to dLAD.   In 10/18, he had atrial fibrillation ablation.  He is in NSR today.   Since his ablation, he says that he has felt more fatigued in general.  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  Mild dyspnea walking up a flight of steps.  No chest pain or tightness.  Weight is up today but he has been eating more.  REDS vest measurement was 32%.  Echo was done today showing EF 35%, diffuse hypokinesis, normal RV size with mildly decreased systolic function.    Labs (4/18): K 4.5, creatinine 1.0 Labs (5/18): K 4.2, creatinine 1.25, LFTs normal, TSH normal Labs (6/18): LDL 14, HDL 28 Labs (8/18): K 4.1 => 3.9, creatinine 1.0 => 1.14, LFTs normal, TSH normal Labs (10/18): hgb 13.8, K 4.4, creatinine 1.02  ECG (9/18, personally reviewed): NSR, PVCs, low voltage  PMH: 1. Atrial fibrillation: Paroxysmal.  2. 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  - Echo (2/18) with mild LV dilation, EF < 20%, severe mitral regurgitation.  - RHC/LHC (3/18): long 80% proximal-mid LAD stenosis, total occlusion of the LCx with left to left collaterals, RCA ok. Mean RA 7, PA 32/10, mean PCWP 16, LVEDP 12, CI 1.54.  - Echo (6/18) with EF 30-35%, diffuse hypokinesis, normal RV size and systolic function, trivial MR.  - Echo (12/18): EF 35%, diffuse hypokinesis, normal RV size with mildly decreased systolic function.  3. CAD: LHC (3/18) with long 80% proximal-mid LAD stenosis, total occlusion of the LCx with left to left collaterals, RCA ok. - 8/18: CTO intervention with DES to LCx and DES x 2 to proximal and mid LAD.  4. Mitral regurgitation: Severe on 2/18 echo.  Suspect functional, as MR only trivial on 6/18 echo.  5. Sleep study (6/18) with no OSA but nocturnal hypoxemia.   SH: Midwife and school custodian, lives in [place], quit smoking in 2011, married.   FH: Grandfather with MIs, mother with CABG and CHF.   Review of systems complete and found to be negative unless listed in HPI.    Current Outpatient Medications  Medication Sig Dispense Refill  . acetaminophen (TYLENOL) 325 MG tablet Take 650 mg by mouth every 6 (six) hours as needed (FOR HEADACHES/SINUS ISSUES.).    Marland Kitchen apixaban (ELIQUIS) 5  MG TABS tablet Take 1 tablet (5 mg total) by mouth 2 (two) times daily. 180 tablet 3  . atorvastatin (LIPITOR) 80 MG tablet TAKE 1 TABLET DAILY AT 6 P.M. 90 tablet 1  . clopidogrel (PLAVIX) 75 MG tablet Take 1 tablet (75 mg total) by mouth daily. 90 tablet 3  . DULoxetine (CYMBALTA) 60 MG capsule Take 1 capsule (60 mg total) by mouth daily. 90 capsule 3  . furosemide (LASIX) 20 MG tablet Take 20 mg by mouth daily as needed (for swelling or weight gain of 3 Lbs.).     Marland Kitchen. metoprolol succinate (TOPROL XL) 25 MG 24 hr tablet Take 1 tablet (25 mg total) by mouth 2 (two) times daily. 180 tablet 3  .  nitroGLYCERIN (NITROSTAT) 0.4 MG SL tablet Place 1 tablet (0.4 mg total) under the tongue every 5 (five) minutes as needed. (Patient not taking: Reported on 09/17/2017) 25 tablet 2  . Omega-3 Fatty Acids (FISH OIL) 1000 MG CAPS Take 1,000 mg by mouth every other day.     . pantoprazole (PROTONIX) 40 MG tablet Take 1 tablet (40 mg total) by mouth daily. (Patient not taking: Reported on 09/17/2017) 45 tablet 0  . potassium chloride (K-DUR,KLOR-CON) 10 MEQ tablet TAKE ONLY ON DAYS WHEN TAKING LASIX (Patient not taking: Reported on 09/17/2017) 90 tablet 3  . [START ON 09/19/2017] sacubitril-valsartan (ENTRESTO) 24-26 MG Take 1 tablet by mouth 2 (two) times daily. 60 tablet 3  . spironolactone (ALDACTONE) 25 MG tablet Take 1 tablet (25 mg total) by mouth at bedtime. 30 tablet 3  . tamsulosin (FLOMAX) 0.4 MG CAPS capsule Take 1 capsule (0.4 mg total) by mouth daily. 90 capsule 3  . zolpidem (AMBIEN) 10 MG tablet Take 5 mg by mouth at bedtime as needed for sleep.      No current facility-administered medications for this encounter.    BP 118/80 (BP Location: Left Arm, Patient Position: Sitting, Cuff Size: Normal)   Pulse 67   Wt 261 lb 12.8 oz (118.8 kg)   SpO2 98%   BMI 31.87 kg/m    Wt Readings from Last 3 Encounters:  09/17/17 261 lb 12.8 oz (118.8 kg)  08/29/17 259 lb 12.8 oz (117.8 kg)  08/07/17 259 lb (117.5 kg)    General: NAD Neck: Thick, no JVD, no thyromegaly or thyroid nodule.  Lungs: Clear to auscultation bilaterally with normal respiratory effort. CV: Nondisplaced PMI.  Heart regular S1/S2, no S3/S4, no murmur.  No peripheral edema.  No carotid bruit.  Normal pedal pulses.  Abdomen: Soft, nontender, no hepatosplenomegaly, no distention.  Skin: Intact without lesions or rashes.  Neurologic: Alert and oriented x 3.  Psych: Normal affect. Extremities: No clubbing or cyanosis.  HEENT: Normal.   Assessment/Plan: 1. Atrial fibrillation: Paroxysmal.  Now s/p atrial fibrillation ablation  in 10/18.  NSR by exam today.   - Continue Eliquis for anticoagulation.  - He has remained in NSR since ablation.  I will have him stop amiodarone today.  I wonder if this was playing a role in his fatigue.   2. 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  Echo in 2/18 with EF < 20%.  EF 30-35% in 6/18 and 35% on repeat echo today.  NYHA II => fatigues easily but minimal dyspnea. Volume stable on exam and by REDS vest reading.  - Continue lasix prn.  - Continue Toprol XL 25 mg bid.  - Stop lisinopril, in 36 hrs start Entresto 24/26 bid. BMET  10 days.  - Continue spironolactone 25 daily.  - EF borderline on today's echo for ICD. 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 I will get a cardiac MRI to more accurately quantify EF.  Narrow QRS, not CRT candidate.   3. CAD: s/p DES to LCx and LAD in 05/2017.  Denies chest pain.  - Continue Plavix and Eliquis, no longer taking ASA as he completed 30 days of triple therapy. Stop Plavix after a year.  - Continue statin.  4. Sleep study showed nocturnal hypoxemia but no OSA.  - Wearing oxygen at night.  5. Mitral regurgitation: Minimal on today's echo.    Follow up in 6 weeks.    Marca Ancona, MD  09/17/2017

## 2017-09-17 NOTE — Patient Instructions (Signed)
Stop Amiodarone  Stop Lisinopril  Start Entresto 24/26 mg Twice daily STARTING ON Wed 12/5 AM  Your physician has requested that you have a cardiac MRI. Cardiac MRI uses a computer to create images of your heart as its beating, producing both still and moving pictures of your heart and major blood vessels. For further information please visit [email or web address]. Please follow the instruction sheet given to you today for more information.  Your physician recommends that you schedule a follow-up appointment in: 6 weeks

## 2017-09-18 ENCOUNTER — Encounter: Payer: Self-pay | Admitting: Cardiology

## 2017-09-18 ENCOUNTER — Telehealth: Payer: Self-pay | Admitting: Cardiology

## 2017-09-18 NOTE — Telephone Encounter (Signed)
Called the patient and gave him the date, time and location of cardiac MRI.  Letter mailed to the patient and nurse notified.

## 2017-09-21 ENCOUNTER — Other Ambulatory Visit (HOSPITAL_COMMUNITY): Payer: Self-pay | Admitting: *Deleted

## 2017-09-21 MED ORDER — SPIRONOLACTONE 25 MG PO TABS: 25.0000 mg | ORAL_TABLET | Freq: Every day | ORAL | 3 refills | Status: DC

## 2017-10-03 ENCOUNTER — Ambulatory Visit (HOSPITAL_COMMUNITY)
Admission: RE | Admit: 2017-10-03 | Discharge: 2017-10-03 | Disposition: A | Discharge status: Home / Self Care | Source: Ambulatory Visit | Attending: Cardiology | Admitting: Cardiology

## 2017-10-03 DIAGNOSIS — I5022 Chronic systolic (congestive) heart failure: Secondary | ICD-10-CM | POA: Insufficient documentation

## 2017-10-03 DIAGNOSIS — I429 Cardiomyopathy, unspecified: Secondary | ICD-10-CM

## 2017-10-03 LAB — CREATININE, SERUM
CREATININE: 1.14 mg/dL (ref 0.61–1.24)
GFR calc non Af Amer: >60 mL/min (ref >60)

## 2017-10-03 MED ORDER — GADOBENATE DIMEGLUMINE 529 MG/ML IV SOLN
40.0000 mL | Freq: Once | INTRAVENOUS | Status: AC
  Administered 2017-10-03: 37 mL via INTRAVENOUS

## 2017-10-05 ENCOUNTER — Other Ambulatory Visit: Payer: Self-pay | Admitting: *Deleted

## 2017-10-05 MED ORDER — SACUBITRIL-VALSARTAN 24-26 MG PO TABS: 1.0000 | ORAL_TABLET | Freq: Two times a day (BID) | ORAL | 3 refills | Status: DC

## 2017-10-29 ENCOUNTER — Telehealth (HOSPITAL_COMMUNITY): Payer: Self-pay | Admitting: Pharmacist

## 2017-10-29 ENCOUNTER — Ambulatory Visit: Admitting: Cardiology

## 2017-10-29 NOTE — Telephone Encounter (Signed)
Entresto PA approved by [employer] through 10/15/98.   Tyler Deis. Bonnye Fava, PharmD, BCPS, CPP Clinical Pharmacist Phone: [phone] 10/29/2017 2:29 PM

## 2017-11-01 ENCOUNTER — Ambulatory Visit (HOSPITAL_COMMUNITY): Admitting: Nurse Practitioner

## 2017-11-09 ENCOUNTER — Ambulatory Visit (HOSPITAL_COMMUNITY)
Admission: RE | Admit: 2017-11-09 | Discharge: 2017-11-09 | Disposition: A | Discharge status: Home / Self Care | Source: Ambulatory Visit | Attending: Cardiology | Admitting: Cardiology

## 2017-11-09 ENCOUNTER — Encounter (HOSPITAL_COMMUNITY): Payer: Self-pay | Admitting: Cardiology

## 2017-11-09 VITALS — BP 110/76 | HR 70 | Wt 260.8 lb

## 2017-11-09 DIAGNOSIS — I4891 Unspecified atrial fibrillation: Secondary | ICD-10-CM

## 2017-11-09 DIAGNOSIS — I2510 Atherosclerotic heart disease of native coronary artery without angina pectoris: Secondary | ICD-10-CM | POA: Diagnosis not present

## 2017-11-09 DIAGNOSIS — Z87891 Personal history of nicotine dependence: Secondary | ICD-10-CM | POA: Insufficient documentation

## 2017-11-09 DIAGNOSIS — I429 Cardiomyopathy, unspecified: Secondary | ICD-10-CM | POA: Insufficient documentation

## 2017-11-09 DIAGNOSIS — Z7901 Long term (current) use of anticoagulants: Secondary | ICD-10-CM | POA: Insufficient documentation

## 2017-11-09 DIAGNOSIS — I480 Paroxysmal atrial fibrillation: Secondary | ICD-10-CM | POA: Insufficient documentation

## 2017-11-09 DIAGNOSIS — Z955 Presence of coronary angioplasty implant and graft: Secondary | ICD-10-CM | POA: Diagnosis not present

## 2017-11-09 DIAGNOSIS — Z79899 Other long term (current) drug therapy: Secondary | ICD-10-CM | POA: Insufficient documentation

## 2017-11-09 DIAGNOSIS — I5022 Chronic systolic (congestive) heart failure: Secondary | ICD-10-CM | POA: Insufficient documentation

## 2017-11-09 LAB — CBC
HCT: 41.6 % (ref 39.0–52.0)
Hemoglobin: 13.6 g/dL (ref 13.0–17.0)
MCH: 29.9 pg (ref 26.0–34.0)
MCHC: 32.7 g/dL (ref 30.0–36.0)
MCV: 91.4 fL (ref 78.0–100.0)
Platelets: 256 10*3/uL (ref 150–400)
RBC: 4.55 MIL/uL (ref 4.22–5.81)
RDW: 14.3 % (ref 11.5–15.5)
WBC: 4.2 10*3/uL (ref 4.0–10.5)

## 2017-11-09 LAB — COMPREHENSIVE METABOLIC PANEL
ALT: 40 U/L (ref 17–63)
ANION GAP: 9 (ref 5–15)
AST: 24 U/L (ref 15–41)
Albumin: 4.2 g/dL (ref 3.5–5.0)
Alkaline Phosphatase: 64 U/L (ref 38–126)
BUN: 18 mg/dL (ref 6–20)
CHLORIDE: 105 mmol/L (ref 101–111)
CO2: 24 mmol/L (ref 22–32)
CREATININE: 1.05 mg/dL (ref 0.61–1.24)
Calcium: 9.2 mg/dL (ref 8.9–10.3)
Glucose, Bld: 114 mg/dL — ABNORMAL HIGH (ref 65–99)
POTASSIUM: 4.6 mmol/L (ref 3.5–5.1)
SODIUM: 138 mmol/L (ref 135–145)
Total Bilirubin: 1.4 mg/dL — ABNORMAL HIGH (ref 0.3–1.2)
Total Protein: 6.6 g/dL (ref 6.5–8.1)

## 2017-11-09 LAB — TSH: TSH: 1.203 u[IU]/mL (ref 0.350–4.500)

## 2017-11-09 MED ORDER — SPIRONOLACTONE 25 MG PO TABS: 25.0000 mg | ORAL_TABLET | Freq: Every day | ORAL | 3 refills | Status: DC

## 2017-11-09 MED ORDER — PANTOPRAZOLE SODIUM 40 MG PO TBEC: 40.0000 mg | DELAYED_RELEASE_TABLET | Freq: Every day | ORAL | 0 refills | Status: DC

## 2017-11-09 MED ORDER — APIXABAN 5 MG PO TABS: 5.0000 mg | ORAL_TABLET | Freq: Two times a day (BID) | ORAL | 3 refills | Status: DC

## 2017-11-09 MED ORDER — FUROSEMIDE 20 MG PO TABS: 20.0000 mg | ORAL_TABLET | Freq: Every day | ORAL | 3 refills | Status: DC | PRN

## 2017-11-09 MED ORDER — METOPROLOL SUCCINATE ER 25 MG PO TB24: 25.0000 mg | ORAL_TABLET | Freq: Two times a day (BID) | ORAL | 3 refills | Status: DC

## 2017-11-09 MED ORDER — SACUBITRIL-VALSARTAN 24-26 MG PO TABS: 1.0000 | ORAL_TABLET | Freq: Two times a day (BID) | ORAL | 3 refills | Status: DC

## 2017-11-09 MED ORDER — ATORVASTATIN CALCIUM 80 MG PO TABS: ORAL_TABLET | ORAL | 3 refills | Status: DC

## 2017-11-09 NOTE — Patient Instructions (Signed)
Labs drawn today (if we do not call you, then your lab work was stable)   Your physician recommends that you schedule a follow-up appointment in: 3 months with Dr. McLean    

## 2017-11-11 ENCOUNTER — Other Ambulatory Visit: Payer: Self-pay | Admitting: Family Medicine

## 2017-11-11 NOTE — Progress Notes (Signed)
PCP: Dr. Ermalinda Memos Cardiology: Dr. Wyline Mood HF Cardiology: Dr. Shirlee Latch  61 y.o.with history of paroxysmal atrial fibrillation, CAD, and chronic systolic CHF was referred by Dr. Wyline Mood for CHF clinic evaluation.    Patient had no cardiac history prior to 2/18.  In 2/18, he developed the onset of exertional dyspnea. After steadily worsening dyspnea, he went to the ER at [facility] and was admitted.  Echo in 2/18 showed EF < 20% with severe MR.  He went back into NSR spontaneously.  He followed up with Dr. Wyline Mood and was set up for a cath in 3/18.  He was found to have a chronically occluded LCx and a long proximal to mid LAD stenosis.  No intervention was done.  He was noted to be back in rapid atrial fibrillation and was admitted.  He was diuresed and started on amiodarone, he converted back to NSR again spontaneously. He is in NSR today.   Underwent successful CTO procedure in 8/18 with DES to LCx, DES to pLAD, DES to dLAD.   In 10/18, he had atrial fibrillation ablation.  He is in NSR today.   Cardiac MRI in 12/18 showed EF up to 44%.   He continues to work full time.  Very active at work. He is in NSR today and has not felt tachypalpitations.  Weight down 1 lb.  SBP in 100s-110s at home.  No dyspnea walking on flat ground or up a flight of stairs. He continues to have significant gneneralized fatigue.  Pain from arthritis. He has occasional blood on toilet paper when he wipes, no melena or hematochezia.   Labs (4/18): K 4.5, creatinine 1.0 Labs (5/18): K 4.2, creatinine 1.25, LFTs normal, TSH normal Labs (6/18): LDL 14, HDL 28 Labs (8/18): K 4.1 => 3.9, creatinine 1.0 => 1.14, LFTs normal, TSH normal Labs (10/18): hgb 13.8, K 4.4, creatinine 1.02  PMH: 1. Atrial fibrillation: Paroxysmal.  2. 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  - Echo (2/18) with mild LV dilation, EF < 20%, severe mitral  regurgitation.  - RHC/LHC (3/18): long 80% proximal-mid LAD stenosis, total occlusion of the LCx with left to left collaterals, RCA ok. Mean RA 7, PA 32/10, mean PCWP 16, LVEDP 12, CI 1.54.  - Echo (6/18) with EF 30-35%, diffuse hypokinesis, normal RV size and systolic function, trivial MR.  - Echo (12/18): EF 35%, diffuse hypokinesis, normal RV size with mildly decreased systolic function.  - Cardiac MRI (12/18): EF 44%, small area of LGE in mid anteroseptal wall suggestive of prior infarction.  3. CAD: LHC (3/18) with long 80% proximal-mid LAD stenosis, total occlusion of the LCx with left to left collaterals, RCA ok. - 8/18: CTO intervention with DES to LCx and DES x 2 to proximal and mid LAD.  4. Mitral regurgitation: Severe on 2/18 echo.  Suspect functional, as MR only trivial on 6/18 echo.  5. Sleep study (6/18) with no OSA but nocturnal hypoxemia.  6. Allergic rhinitis  SH: Bus driver and school custodian, lives in [place], quit smoking in 2011, married.   FH: Grandfather with MIs, mother with CABG and CHF.   Review of systems complete and found to be negative unless listed in HPI.    Current Outpatient Medications  Medication Sig Dispense Refill  . apixaban (ELIQUIS) 5 MG TABS tablet Take 1 tablet (5 mg total) by mouth 2 (two) times daily. 180 tablet 3  . atorvastatin (LIPITOR) 80  MG tablet TAKE 1 TABLET DAILY AT 6 P.M. 90 tablet 3  . clopidogrel (PLAVIX) 75 MG tablet Take 1 tablet (75 mg total) by mouth daily. 90 tablet 3  . DULoxetine (CYMBALTA) 60 MG capsule Take 1 capsule (60 mg total) by mouth daily. 90 capsule 3  . metoprolol succinate (TOPROL XL) 25 MG 24 hr tablet Take 1 tablet (25 mg total) by mouth 2 (two) times daily. 180 tablet 3  . Omega-3 Fatty Acids (FISH OIL) 1000 MG CAPS Take 1,000 mg by mouth every other day.     . sacubitril-valsartan (ENTRESTO) 24-26 MG Take 1 tablet by mouth 2 (two) times daily. 60 tablet 3  . spironolactone (ALDACTONE) 25 MG tablet Take 1  tablet (25 mg total) by mouth at bedtime. 90 tablet 3  . tamsulosin (FLOMAX) 0.4 MG CAPS capsule Take 1 capsule (0.4 mg total) by mouth daily. 90 capsule 3  . zolpidem (AMBIEN) 10 MG tablet Take 5 mg by mouth at bedtime as needed for sleep.     Marland Kitchen acetaminophen (TYLENOL) 325 MG tablet Take 650 mg by mouth every 6 (six) hours as needed (FOR HEADACHES/SINUS ISSUES.).    Marland Kitchen furosemide (LASIX) 20 MG tablet Take 1 tablet (20 mg total) by mouth daily as needed (for swelling or weight gain of 3 Lbs.). 30 tablet 3  . nitroGLYCERIN (NITROSTAT) 0.4 MG SL tablet Place 1 tablet (0.4 mg total) under the tongue every 5 (five) minutes as needed. (Patient not taking: Reported on 09/17/2017) 25 tablet 2  . pantoprazole (PROTONIX) 40 MG tablet Take 1 tablet (40 mg total) by mouth daily. 45 tablet 0  . potassium chloride (K-DUR,KLOR-CON) 10 MEQ tablet TAKE ONLY ON DAYS WHEN TAKING LASIX (Patient not taking: Reported on 09/17/2017) 90 tablet 3   No current facility-administered medications for this encounter.    BP 110/76 (BP Location: Right Arm, Patient Position: Sitting, Cuff Size: Normal)   Pulse 70   Wt 260 lb 12.8 oz (118.3 kg)   SpO2 100%   BMI 31.75 kg/m    Wt Readings from Last 3 Encounters:  11/09/17 260 lb 12.8 oz (118.3 kg)  09/17/17 261 lb 12.8 oz (118.8 kg)  08/29/17 259 lb 12.8 oz (117.8 kg)    General: NAD Neck: No JVD, no thyromegaly or thyroid nodule.  Lungs: Clear to auscultation bilaterally with normal respiratory effort. CV: Nondisplaced PMI.  Heart regular S1/S2, no S3/S4, no murmur.  No peripheral edema.  No carotid bruit.  Normal pedal pulses.  Abdomen: Soft, nontender, no hepatosplenomegaly, no distention.  Skin: Intact without lesions or rashes.  Neurologic: Alert and oriented x 3.  Psych: Normal affect. Extremities: No clubbing or cyanosis.  HEENT: Normal.   Assessment/Plan: 1. Atrial fibrillation: Paroxysmal.  Now s/p atrial fibrillation ablation in 10/18.  NSR by exam today.   He is now off amiodarone.  - Continue Eliquis for anticoagulation.  - He has remained in NSR since ablation.   2. 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  Echo in 2/18 with EF < 20%.  EF 30-35% in 6/18 and 35% on repeat echo 12/18. Cardiac MRI in 12/18 to determine need for ICD showed EF 44%. NYHA class II symptoms but fatigues easily.  Not volume overloaded on exam.   - Continue lasix prn.  - Continue Toprol XL 25 mg bid.  - Continue Entresto 24/26 bid.   - Continue spironolactone 25 daily.  - BMET today.  - EF is out of range  for ICD.  3. CAD: s/p DES to LCx and LAD in 05/2017.  Denies chest pain.  - Continue Plavix and Eliquis, no longer taking ASA as he completed 30 days of triple therapy. - Stop Plavix after a year => 8/19.  - Continue statin.  4. Sleep study showed nocturnal hypoxemia but no OSA.  - Should wear oxygen at night.  5. Mitral regurgitation: Minimal on 12/18 echo.   6. Fatigue: Not hypotensive, no OSA.  Not volume overloaded on exam.  I will check TSH and CBC.   Follow up in 3 months.    Marca Ancona, MD  11/11/2017

## 2017-11-12 NOTE — Telephone Encounter (Signed)
Last seen 01/25/17  Dr Oswaldo Done   Dr Ermalinda Memos PCP

## 2017-12-05 ENCOUNTER — Ambulatory Visit: Admitting: Family Medicine

## 2017-12-06 ENCOUNTER — Encounter: Payer: Self-pay | Admitting: Family Medicine

## 2017-12-11 ENCOUNTER — Ambulatory Visit (INDEPENDENT_AMBULATORY_CARE_PROVIDER_SITE_OTHER): Admitting: Family Medicine

## 2017-12-11 ENCOUNTER — Encounter: Payer: Self-pay | Admitting: Family Medicine

## 2017-12-11 VITALS — BP 100/71 | HR 78 | Temp 98.7°F | Ht 76.0 in | Wt 269.4 lb

## 2017-12-11 DIAGNOSIS — M5412 Radiculopathy, cervical region: Secondary | ICD-10-CM

## 2017-12-11 DIAGNOSIS — Z0000 Encounter for general adult medical examination without abnormal findings: Secondary | ICD-10-CM

## 2017-12-11 DIAGNOSIS — Z1159 Encounter for screening for other viral diseases: Secondary | ICD-10-CM | POA: Diagnosis not present

## 2017-12-11 DIAGNOSIS — N401 Enlarged prostate with lower urinary tract symptoms: Secondary | ICD-10-CM | POA: Diagnosis not present

## 2017-12-11 DIAGNOSIS — Z23 Encounter for immunization: Secondary | ICD-10-CM

## 2017-12-11 MED ORDER — TAMSULOSIN HCL 0.4 MG PO CAPS: 0.8000 mg | ORAL_CAPSULE | Freq: Every day | ORAL | 3 refills | Status: DC

## 2017-12-11 MED ORDER — FINASTERIDE 5 MG PO TABS: 5.0000 mg | ORAL_TABLET | Freq: Every day | ORAL | 3 refills | Status: DC

## 2017-12-11 MED ORDER — DULOXETINE HCL 60 MG PO CPEP: 60.0000 mg | ORAL_CAPSULE | Freq: Every day | ORAL | 1 refills | Status: DC

## 2017-12-11 NOTE — Patient Instructions (Signed)
Great to see you!  Increase flomax to 2 capsules once daily, start proscar as well ( this will take full effect in 6 months)

## 2017-12-11 NOTE — Addendum Note (Signed)
Addended by: Angela Adam on: 12/11/2017 01:28 PM   Modules accepted: Orders

## 2017-12-11 NOTE — Progress Notes (Signed)
   HPI  Patient presents today here to follow up for neck pain, BPH, and Hep C screening  Neck pain With BL arm numbness at night while sleeping, doing well from a cardiac perspective now and is interested in getting this addressed if needed  BPH Patient still with symptoms although improved on Flomax. Patient is weak stream and straining to urinate. Is more difficult with the medications he is taking. Patient is tolerating Flomax without a problem.  PMH: Smoking status noted ROS: Per HPI  Objective: BP 100/71   Pulse 78   Temp 98.7 F (37.1 C) (Oral)   Ht 6\' 4"  (1.93 m)   Wt 269 lb 6.4 oz (122.2 kg)   BMI 32.79 kg/m  Gen: NAD, alert, cooperative with exam HEENT: NCAT, EOMI, PERRL CV: RRR, good S1/S2, no murmur Resp: CTABL, no wheezes, non-labored Ext: No edema, warm Neuro: Alert and oriented, No gross deficits  Assessment and plan:  # BPH  Improved but not ideal PSA Increase flomax to 0.8, add proscar  # Radiculopathy BL arm numbness, has failed epidural injections Ortho referral  # Need for Hep C screening Discussed- hep c screening  HCM- UP to date otherwise- TDaP given, counseling provided for all vaccines and vaccine components   Orders Placed This Encounter  Procedures  . PSA  . Hepatitis C antibody  . Ambulatory referral to Orthopedic Surgery    Referral Priority:   Routine    Referral Type:   Surgical    Referral Reason:   Specialty Services Required    Requested Specialty:   Orthopedic Surgery    Number of Visits Requested:   1    Meds ordered this encounter  Medications  . DULoxetine (CYMBALTA) 60 MG capsule    Sig: Take 1 capsule (60 mg total) by mouth daily.    Dispense:  90 capsule    Refill:  1  . tamsulosin (FLOMAX) 0.4 MG CAPS capsule    Sig: Take 2 capsules (0.8 mg total) by mouth daily.    Dispense:  180 capsule    Refill:  3  . finasteride (PROSCAR) 5 MG tablet    Sig: Take 1 tablet (5 mg total) by mouth daily.    Dispense:   90 tablet    Refill:  3    Murtis Sink, MD [facility] 12/11/2017, 1:18 PM

## 2017-12-12 LAB — HEPATITIS C ANTIBODY: Hep C Virus Ab: <0.1 s/co ratio (ref 0.0–0.9)

## 2017-12-12 LAB — PSA: Prostate Specific Ag, Serum: 1.6 ng/mL (ref 0.0–4.0)

## 2017-12-13 ENCOUNTER — Encounter: Payer: Self-pay | Admitting: Family Medicine

## 2018-01-23 ENCOUNTER — Telehealth: Payer: Self-pay | Admitting: Family Medicine

## 2018-01-23 NOTE — Telephone Encounter (Signed)
Pt's wife informed that he could not be seen by neurosurgery until an MRI had been performed. I contacted [facility] concerning receipt of previous records. They had received them and Dr; Shon Baton has agreed to see him. Pt is scheduled for 02/08/18 at 10:45 with Dr. Shon Baton. Pt's wife aware.

## 2018-02-08 DIAGNOSIS — M5030 Other cervical disc degeneration, unspecified cervical region: Secondary | ICD-10-CM | POA: Insufficient documentation

## 2018-02-11 ENCOUNTER — Other Ambulatory Visit: Payer: Self-pay

## 2018-02-11 ENCOUNTER — Ambulatory Visit (HOSPITAL_COMMUNITY)
Admission: RE | Admit: 2018-02-11 | Discharge: 2018-02-11 | Disposition: A | Discharge status: Home / Self Care | Source: Ambulatory Visit | Attending: Cardiology | Admitting: Cardiology

## 2018-02-11 ENCOUNTER — Encounter (HOSPITAL_COMMUNITY): Payer: Self-pay | Admitting: Cardiology

## 2018-02-11 VITALS — BP 111/67 | HR 71 | Wt 272.8 lb

## 2018-02-11 DIAGNOSIS — Z7901 Long term (current) use of anticoagulants: Secondary | ICD-10-CM | POA: Diagnosis not present

## 2018-02-11 DIAGNOSIS — Z79899 Other long term (current) drug therapy: Secondary | ICD-10-CM | POA: Diagnosis not present

## 2018-02-11 DIAGNOSIS — I4891 Unspecified atrial fibrillation: Secondary | ICD-10-CM | POA: Diagnosis not present

## 2018-02-11 DIAGNOSIS — R000 Tachycardia, unspecified: Secondary | ICD-10-CM | POA: Insufficient documentation

## 2018-02-11 DIAGNOSIS — I2510 Atherosclerotic heart disease of native coronary artery without angina pectoris: Secondary | ICD-10-CM | POA: Diagnosis not present

## 2018-02-11 DIAGNOSIS — I480 Paroxysmal atrial fibrillation: Secondary | ICD-10-CM | POA: Diagnosis present

## 2018-02-11 DIAGNOSIS — I429 Cardiomyopathy, unspecified: Secondary | ICD-10-CM | POA: Diagnosis not present

## 2018-02-11 DIAGNOSIS — I5022 Chronic systolic (congestive) heart failure: Secondary | ICD-10-CM | POA: Diagnosis not present

## 2018-02-11 DIAGNOSIS — I340 Nonrheumatic mitral (valve) insufficiency: Secondary | ICD-10-CM | POA: Insufficient documentation

## 2018-02-11 DIAGNOSIS — Z87891 Personal history of nicotine dependence: Secondary | ICD-10-CM | POA: Insufficient documentation

## 2018-02-11 DIAGNOSIS — Z7902 Long term (current) use of antithrombotics/antiplatelets: Secondary | ICD-10-CM | POA: Diagnosis not present

## 2018-02-11 DIAGNOSIS — Z955 Presence of coronary angioplasty implant and graft: Secondary | ICD-10-CM | POA: Insufficient documentation

## 2018-02-11 DIAGNOSIS — Z8249 Family history of ischemic heart disease and other diseases of the circulatory system: Secondary | ICD-10-CM | POA: Insufficient documentation

## 2018-02-11 LAB — BASIC METABOLIC PANEL
Anion gap: 7 (ref 5–15)
BUN: 18 mg/dL (ref 6–20)
CALCIUM: 9 mg/dL (ref 8.9–10.3)
CO2: 26 mmol/L (ref 22–32)
Chloride: 106 mmol/L (ref 101–111)
Creatinine, Ser: 0.99 mg/dL (ref 0.61–1.24)
GFR calc non Af Amer: >60 mL/min (ref >60)
GLUCOSE: 97 mg/dL (ref 65–99)
Potassium: 4.2 mmol/L (ref 3.5–5.1)
Sodium: 139 mmol/L (ref 135–145)

## 2018-02-11 NOTE — Progress Notes (Signed)
PCP: Dr. Ermalinda Memos Cardiology: Dr. Wyline Mood HF Cardiology: Dr. Shirlee Latch  62 y.o.with history of paroxysmal atrial fibrillation, CAD, and chronic systolic CHF was referred by Dr. Wyline Mood for CHF clinic evaluation.    Patient had no cardiac history prior to 2/18.  In 2/18, he developed the onset of exertional dyspnea. After steadily worsening dyspnea, he went to the ER at [facility] and was admitted.  Echo in 2/18 showed EF < 20% with severe MR.  He went back into NSR spontaneously.  He followed up with Dr. Wyline Mood and was set up for a cath in 3/18.  He was found to have a chronically occluded LCx and a long proximal to mid LAD stenosis.  No intervention was done.  He was noted to be back in rapid atrial fibrillation and was admitted.  He was diuresed and started on amiodarone, he converted back to NSR again spontaneously.    Underwent successful CTO procedure in 8/18 with DES to LCx, DES to pLAD, DES to dLAD.   In 10/18, he had atrial fibrillation ablation.  Now off amiodarone.   Cardiac MRI in 12/18 showed EF up to 44%.   Patient returns for followup of CAD and CHF.  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  Very active at work. He is in NSR today and has not felt tachypalpitations.  Weight is up, he has been traveling and eating out a lot.  He still has significant fatigue at the end of the day. Breathing generally ok, only short of breath with heavy exertion such as walking up a hill or heavy lifting.  No chest pain.  No orthopnea/PND.  He has significant posterior neck pain and has seen an orthopedic surgeon for c-spine arthritis.   Labs (4/18): K 4.5, creatinine 1.0 Labs (5/18): K 4.2, creatinine 1.25, LFTs normal, TSH normal Labs (6/18): LDL 14, HDL 28 Labs (8/18): K 4.1 => 3.9, creatinine 1.0 => 1.14, LFTs normal, TSH normal Labs (10/18): hgb 13.8, K 4.4, creatinine 1.02 Labs (1/19): hgb 13.6, TSH normal, K 4.6, creatinine 1.05  ECG (personally reviewed): NSR at  67  PMH: 1. Atrial fibrillation: Paroxysmal.  2. 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  - Echo (2/18) with mild LV dilation, EF < 20%, severe mitral regurgitation.  - RHC/LHC (3/18): long 80% proximal-mid LAD stenosis, total occlusion of the LCx with left to left collaterals, RCA ok. Mean RA 7, PA 32/10, mean PCWP 16, LVEDP 12, CI 1.54.  - Echo (6/18) with EF 30-35%, diffuse hypokinesis, normal RV size and systolic function, trivial MR.  - Echo (12/18): EF 35%, diffuse hypokinesis, normal RV size with mildly decreased systolic function.  - Cardiac MRI (12/18): EF 44%, small area of LGE in mid anteroseptal wall suggestive of prior infarction.  3. CAD: LHC (3/18) with long 80% proximal-mid LAD stenosis, total occlusion of the LCx with left to left collaterals, RCA ok. - 8/18: CTO intervention with DES to LCx and DES x 2 to proximal and mid LAD.  4. Mitral regurgitation: Severe on 2/18 echo.  Suspect functional, as MR only trivial on 6/18 echo.  5. Sleep study (6/18) with no OSA but nocturnal hypoxemia.  6. Allergic rhinitis  SH: Bus driver and school custodian, lives in [place], quit smoking in 2011, married.   FH: Grandfather with MIs, mother with CABG and CHF.   Review of systems complete and found to be negative unless  listed in HPI.    Current Outpatient Medications  Medication Sig Dispense Refill  . acetaminophen (TYLENOL) 325 MG tablet Take 650 mg by mouth every 6 (six) hours as needed (FOR HEADACHES/SINUS ISSUES.).    Marland Kitchen apixaban (ELIQUIS) 5 MG TABS tablet Take 1 tablet (5 mg total) by mouth 2 (two) times daily. 180 tablet 3  . atorvastatin (LIPITOR) 80 MG tablet TAKE 1 TABLET DAILY AT 6 P.M. 90 tablet 3  . clopidogrel (PLAVIX) 75 MG tablet Take 1 tablet (75 mg total) by mouth daily. 90 tablet 3  . DULoxetine (CYMBALTA) 60 MG capsule Take 1 capsule (60 mg total) by mouth daily. 90  capsule 1  . finasteride (PROSCAR) 5 MG tablet Take 1 tablet (5 mg total) by mouth daily. 90 tablet 3  . furosemide (LASIX) 20 MG tablet Take 1 tablet (20 mg total) by mouth daily as needed (for swelling or weight gain of 3 Lbs.). 30 tablet 3  . metoprolol succinate (TOPROL XL) 25 MG 24 hr tablet Take 1 tablet (25 mg total) by mouth 2 (two) times daily. 180 tablet 3  . nitroGLYCERIN (NITROSTAT) 0.4 MG SL tablet Place 1 tablet (0.4 mg total) under the tongue every 5 (five) minutes as needed. 25 tablet 2  . Omega-3 Fatty Acids (FISH OIL) 1000 MG CAPS Take 1,000 mg by mouth every other day.     . potassium chloride (K-DUR,KLOR-CON) 10 MEQ tablet TAKE ONLY ON DAYS WHEN TAKING LASIX 90 tablet 3  . sacubitril-valsartan (ENTRESTO) 24-26 MG Take 1 tablet by mouth 2 (two) times daily. 60 tablet 3  . spironolactone (ALDACTONE) 25 MG tablet Take 1 tablet (25 mg total) by mouth at bedtime. 90 tablet 3  . tamsulosin (FLOMAX) 0.4 MG CAPS capsule Take 2 capsules (0.8 mg total) by mouth daily. 180 capsule 3  . zolpidem (AMBIEN) 10 MG tablet Take 5 mg by mouth at bedtime as needed for sleep.      No current facility-administered medications for this encounter.    BP 111/67   Pulse 71   Wt 272 lb 12 oz (123.7 kg)   SpO2 98%   BMI 33.20 kg/m    Wt Readings from Last 3 Encounters:  02/11/18 272 lb 12 oz (123.7 kg)  12/11/17 269 lb 6.4 oz (122.2 kg)  11/09/17 260 lb 12.8 oz (118.3 kg)    General: NAD Neck: No JVD, no thyromegaly or thyroid nodule.  Lungs: Clear to auscultation bilaterally with normal respiratory effort. CV: Nondisplaced PMI.  Heart regular S1/S2, no S3/S4, no murmur.  No peripheral edema.  No carotid bruit.  Normal pedal pulses.  Abdomen: Soft, nontender, no hepatosplenomegaly, no distention.  Skin: Intact without lesions or rashes.  Neurologic: Alert and oriented x 3.  Psych: Normal affect. Extremities: No clubbing or cyanosis.  HEENT: Normal.   Assessment/Plan: 1. Atrial  fibrillation: Paroxysmal.  Now s/p atrial fibrillation ablation in 10/18.  NSR today.  He is now off amiodarone.  - Continue Eliquis for anticoagulation.  - He has remained in NSR since ablation.   2. 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  Echo in 2/18 with EF < 20%.  EF 30-35% in 6/18 and 35% on repeat echo 12/18. Cardiac MRI in 12/18 to determine need for ICD showed EF 44%. NYHA class II symptoms but fatigues easily.  Not volume overloaded on exam.   - Continue lasix prn.  - Continue Toprol XL 25 mg bid.  - Continue Entresto 24/26 bid.   -  Continue spironolactone 25 daily.  - BMET today.  - EF is out of range for ICD.  - Repeat echo at 1 year to make sure that it remains improved (12/19).  3. CAD: s/p DES to LCx and LAD in 05/2017.  Denies chest pain.  - Continue Plavix and Eliquis, no longer taking ASA as he completed 30 days of triple therapy.  - Stop Plavix after a year => end of 8/19.  - Continue statin.  4. Sleep study showed nocturnal hypoxemia but no OSA.  - He needs to get set up for nocturnal oxygen, will arrange.  5. Mitral regurgitation: Minimal on 12/18 echo.    Follow up in 4 months.    Marca Ancona, MD  02/11/2018

## 2018-02-11 NOTE — Telephone Encounter (Signed)
  Noralee Space, RN  Reesa Chew, CMA        Hey, this pt had a sleep study back in June 2018 and Dr Norris Cross interpretation says will need oxygen but I don't see where anything was done for it, we saw pt today and he said he never heard anything about it.

## 2018-02-11 NOTE — Telephone Encounter (Signed)
Dr Shirlee Latch Herbert Seta) saw the patient today and will write the oxygen order for patient to have  RE: night time oxygen... order O2 at 2L [place] for use during sleep for nocturnal hypoxemia and get an overnight pulse ox on 2L [place] and fax to [facility] at [phone] or [phone] . Oxygen has to be ordered with 30 days of the office visit. Dr Mayford Knife does not have any openings on her schedule for the patient at this time.

## 2018-02-11 NOTE — Progress Notes (Signed)
Pt did have abnormal sleep study in June 2018, unsure why oxygen was not set up.  Message sent to Dr Norris Cross office to get patient set up.

## 2018-02-11 NOTE — Patient Instructions (Signed)
STOP Plavix on September 1st 2019.  Routine lab work today. Will notify you of abnormal results  Follow up in 4 months.

## 2018-02-21 NOTE — Telephone Encounter (Addendum)
Gave patient recommended oxygen order made by Dr. Mayford Knife and understanding was verbalized. Patient understands Dr Mayford Knife has ordered him O2 at 2L [place] for use during sleep for nocturnal hypoxemia and get an overnight pulse ox on 2 l . Upon patient request DME selection is [facility]. Patient understands he will be contacted by [facility] to set up his 02. Patient understands to call if [facility] does not contact him with new setup in a timely manner. Patient understands they will be called once confirmation has been received from [facility] that they have received their 02.  [facility] notified of new cpap order  Please add to airview Patient was grateful for the call and thanked me.

## 2018-02-21 NOTE — Telephone Encounter (Addendum)
RE: original order  Quintella Reichert, MD  Reesa Chew, CMA        I am fine seeing the patient - has O2 been set up   Traci     ----- Message -----  From: Reesa Chew, CMA  Sent: 02/15/2018  4:30 PM  To: Quintella Reichert, MD  Subject: FW: original order                Please advise  ----- Message -----  From: Noralee Space, RN  Sent: 02/14/2018  4:21 PM  To: Reesa Chew, CMA  Subject: RE: original order                Dr Shirlee Latch said he would prefer if Dr Mayford Knife just sees pt and then orders O2 and then she can handle to repeat ONO and adjustments if needed, thanks  ----- Message -----  From: Reesa Chew, CMA  Sent: 02/11/2018  4:03 PM  To: Noralee Space, RN  Subject: original order                  RE: night time oxygen  Please order O2 at 2L [place] for use during sleep for nocturnal hypoxemia and get an overnight pulse ox on 2L [place]

## 2018-03-04 ENCOUNTER — Telehealth: Payer: Self-pay | Admitting: *Deleted

## 2018-03-04 DIAGNOSIS — G4734 Idiopathic sleep related nonobstructive alveolar hypoventilation: Secondary | ICD-10-CM

## 2018-03-04 NOTE — Telephone Encounter (Signed)
-----   Message from [place], RN sent at 03/04/2018  2:39 PM EDT ----- Regarding: FW: original order Does this patient need an appt or O2 orders?   Thanks Rena  ----- Message ----- From: Reesa Chew, CMA Sent: 02/21/2018   1:58 PM To: Phineas Semen, RN Subject: FW: original order                               ----- Message ----- From: Quintella Reichert, MD Sent: 02/18/2018   4:07 PM To: Reesa Chew, CMA Subject: RE: original order                             I am fine seeing the patient - has O2 been set up  Traci ----- Message ----- From: Reesa Chew, CMA Sent: 02/15/2018   4:30 PM To: Quintella Reichert, MD Subject: FW: original order                             Please advise ----- Message ----- From: Noralee Space, RN Sent: 02/14/2018   4:21 PM To: Reesa Chew, CMA Subject: RE: original order                             Dr Shirlee Latch said he would prefer if Dr Mayford Knife just sees pt and then orders O2 and then she can handle to repeat ONO and adjustments if needed, thanks ----- Message ----- From: Reesa Chew, CMA Sent: 02/11/2018   4:03 PM To: Noralee Space, RN Subject: original order                                  RE: night time oxygen Please order O2 at 2L [place] for use during sleep for nocturnal hypoxemia and get an overnight pulse ox on 2L  Thanks, Heather  ----- Message ----- From: Noralee Space, RN Sent: 02/11/2018  12:15 PM To: Reesa Chew, CMA  Hey, this pt had a sleep study back in June 2018 and Dr Norris Cross interpretation says will need oxygen but I don't see where anything was done for it, we saw pt today and he said he never heard anything about it.  Can you check on this and please get him set up, thanks

## 2018-03-04 NOTE — Telephone Encounter (Addendum)
Per Dr Norris Cross request ONO on 2L [place] and 2L O2  at night for nocturnal hypoxemia. Orders in  and sent to [facility]. Patient's wife is aware and agreeable.

## 2018-03-05 ENCOUNTER — Other Ambulatory Visit: Payer: Self-pay | Admitting: Cardiology

## 2018-03-05 NOTE — Telephone Encounter (Signed)
Fax from [place] signed by Dr Mayford Knife and faxed back for ONO on room air.Marland Kitchen

## 2018-03-25 NOTE — Telephone Encounter (Signed)
Kevin Oconnell at [facility] says he had to do the ONO on room air to qualify for oxygen and he is getting set up this week with his oxygen.

## 2018-04-09 ENCOUNTER — Other Ambulatory Visit (HOSPITAL_COMMUNITY): Payer: Self-pay | Admitting: Cardiology

## 2018-04-22 ENCOUNTER — Encounter: Payer: Self-pay | Admitting: Cardiology

## 2018-06-06 ENCOUNTER — Ambulatory Visit: Admitting: Cardiology

## 2018-06-10 ENCOUNTER — Other Ambulatory Visit: Payer: Self-pay

## 2018-06-10 MED ORDER — DULOXETINE HCL 60 MG PO CPEP: 60.0000 mg | ORAL_CAPSULE | Freq: Every day | ORAL | 0 refills | Status: DC

## 2018-06-10 NOTE — Telephone Encounter (Signed)
Last seen 12/11/17  Dr Ermalinda Memos

## 2018-06-14 ENCOUNTER — Encounter: Payer: Self-pay | Admitting: Family Medicine

## 2018-06-14 ENCOUNTER — Ambulatory Visit (INDEPENDENT_AMBULATORY_CARE_PROVIDER_SITE_OTHER): Admitting: Family Medicine

## 2018-06-14 VITALS — BP 97/72 | HR 94 | Temp 97.4°F | Ht 76.0 in | Wt 275.0 lb

## 2018-06-14 DIAGNOSIS — H02826 Cysts of left eye, unspecified eyelid: Secondary | ICD-10-CM | POA: Diagnosis not present

## 2018-06-14 DIAGNOSIS — N401 Enlarged prostate with lower urinary tract symptoms: Secondary | ICD-10-CM

## 2018-06-14 MED ORDER — TAMSULOSIN HCL 0.4 MG PO CAPS: 0.4000 mg | ORAL_CAPSULE | Freq: Every day | ORAL | 3 refills | Status: DC

## 2018-06-14 NOTE — Patient Instructions (Signed)
I have placed a referral to Dr. Sherryll Burger at [facility] in [place] for your eyelid.  I suspect that this is a benign cyst, since it is growing he will likely agree to remove it.  I have also placed a referral to [facility] to evaluate your prostate.  You should have refills on both of your medications through February of next year.  Please contact me if you do not hear for an appointment from either of the specialist within the next week.  Benign Prostatic Hyperplasia Benign prostatic hyperplasia (BPH) is an enlarged prostate gland that is caused by the normal aging process and not by cancer. The prostate is a walnut-sized gland that is involved in the production of semen. It is located in front of the rectum and below the bladder. The bladder stores urine and the urethra is the tube that carries the urine out of the body. The prostate may get bigger as a man gets older. An enlarged prostate can press on the urethra. This can make it harder to pass urine. The build-up of urine in the bladder can cause infection. Back pressure and infection may progress to bladder damage and kidney (renal) failure. What are the causes? This condition is part of a normal aging process. However, not all men develop problems from this condition. If the prostate enlarges away from the urethra, urine flow will not be blocked. If it enlarges toward the urethra and compresses it, there will be problems passing urine. What increases the risk? This condition is more likely to develop in men over the age of 50 years. What are the signs or symptoms? Symptoms of this condition include:  Getting up often during the night to urinate.  Needing to urinate frequently during the day.  Difficulty starting urine flow.  Decrease in size and strength of your urine stream.  Leaking (dribbling) after urinating.  Inability to pass urine. This needs immediate treatment.  Inability to completely empty your  bladder.  Pain when you pass urine. This is more common if there is also an infection.  Urinary tract infection (UTI).  How is this diagnosed? This condition is diagnosed based on your medical history, a physical exam, and your symptoms. Tests will also be done, such as:  A post-void bladder scan. This measures any amount of urine that may remain in your bladder after you finish urinating.  A digital rectal exam. In a rectal exam, your health care provider checks your prostate by putting a lubricated, gloved finger into your rectum to feel the back of your prostate gland. This exam detects the size of your gland and any abnormal lumps or growths.  An exam of your urine (urinalysis).  A prostate specific antigen (PSA) screening. This is a blood test used to screen for prostate cancer.  An ultrasound. This test uses sound waves to electronically produce a picture of your prostate gland.  Your health care provider may refer you to a specialist in kidney and prostate diseases (urologist). How is this treated? Once symptoms begin, your health care provider will monitor your condition (active surveillance or watchful waiting). Treatment for this condition will depend on the severity of your condition. Treatment may include:  Observation and yearly exams. This may be the only treatment needed if your condition and symptoms are mild.  Medicines to relieve your symptoms, including: ? Medicines to shrink the prostate. ? Medicines to relax the muscle of the prostate.  Surgery in severe cases. Surgery may include: ?  Prostatectomy. In this procedure, the prostate tissue is removed completely through an open incision or with a laparascope or robotics. ? Transurethral resection of the prostate (TURP). In this procedure, a tool is inserted through the opening at the tip of the penis (urethra). It is used to cut away tissue of the inner core of the prostate. The pieces are removed through the same  opening of the penis. This removes the blockage. ? Transurethral incision (TUIP). In this procedure, small cuts are made in the prostate. This lessens the prostate's pressure on the urethra. ? Transurethral microwave thermotherapy (TUMT). This procedure uses microwaves to create heat. The heat destroys and removes a small amount of prostate tissue. ? Transurethral needle ablation (TUNA). This procedure uses radio frequencies to destroy and remove a small amount of prostate tissue. ? Interstitial laser coagulation (ILC). This procedure uses a laser to destroy and remove a small amount of prostate tissue. ? Transurethral electrovaporization (TUVP). This procedure uses electrodes to destroy and remove a small amount of prostate tissue. ? Prostatic urethral lift. This procedure inserts an implant to push the lobes of the prostate away from the urethra.  Follow these instructions at home:  Take over-the-counter and prescription medicines only as told by your health care provider.  Monitor your symptoms for any changes. Contact your health care provider with any changes.  Avoid drinking large amounts of liquid before going to bed or out in public.  Avoid or reduce how much caffeine or alcohol you drink.  Give yourself time when you urinate.  Keep all follow-up visits as told by your health care provider. This is important. Contact a health care provider if:  You have unexplained back pain.  Your symptoms do not get better with treatment.  You develop side effects from the medicine you are taking.  Your urine becomes very dark or has a bad smell.  Your lower abdomen becomes distended and you have trouble passing your urine. Get help right away if:  You have a fever or chills.  You suddenly cannot urinate.  You feel lightheaded, or very dizzy, or you faint.  There are large amounts of blood or clots in the urine.  Your urinary problems become hard to manage.  You develop moderate  to severe low back or flank pain. The flank is the side of your body between the ribs and the hip. These symptoms may represent a serious problem that is an emergency. Do not wait to see if the symptoms will go away. Get medical help right away. Call your local emergency services (911 in the [place] Do not drive yourself to the hospital. Summary  Benign prostatic hyperplasia (BPH) is an enlarged prostate that is caused by the normal aging process and not by cancer.  An enlarged prostate can press on the urethra. This can make it hard to pass urine.  This condition is part of a normal aging process and is more likely to develop in men over the age of 50 years.  Get help right away if you suddenly cannot urinate. This information is not intended to replace advice given to you by your health care provider. Make sure you discuss any questions you have with your health care provider. Document Released: 10/02/2005 Document Revised: 11/06/2016 Document Reviewed: 11/06/2016 Elsevier Interactive Patient Education  [employer].
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
## 2018-06-18 ENCOUNTER — Ambulatory Visit (HOSPITAL_COMMUNITY)
Admission: RE | Admit: 2018-06-18 | Discharge: 2018-06-18 | Disposition: A | Discharge status: Home / Self Care | Source: Ambulatory Visit | Attending: Cardiology | Admitting: Cardiology

## 2018-06-18 VITALS — BP 110/76 | HR 85 | Wt 269.8 lb

## 2018-06-18 DIAGNOSIS — I480 Paroxysmal atrial fibrillation: Secondary | ICD-10-CM | POA: Diagnosis not present

## 2018-06-18 DIAGNOSIS — Z87891 Personal history of nicotine dependence: Secondary | ICD-10-CM | POA: Insufficient documentation

## 2018-06-18 DIAGNOSIS — I2510 Atherosclerotic heart disease of native coronary artery without angina pectoris: Secondary | ICD-10-CM | POA: Insufficient documentation

## 2018-06-18 DIAGNOSIS — R0902 Hypoxemia: Secondary | ICD-10-CM | POA: Insufficient documentation

## 2018-06-18 DIAGNOSIS — I340 Nonrheumatic mitral (valve) insufficiency: Secondary | ICD-10-CM | POA: Diagnosis not present

## 2018-06-18 DIAGNOSIS — Z09 Encounter for follow-up examination after completed treatment for conditions other than malignant neoplasm: Secondary | ICD-10-CM | POA: Diagnosis not present

## 2018-06-18 DIAGNOSIS — Z7901 Long term (current) use of anticoagulants: Secondary | ICD-10-CM | POA: Insufficient documentation

## 2018-06-18 DIAGNOSIS — Z7902 Long term (current) use of antithrombotics/antiplatelets: Secondary | ICD-10-CM | POA: Diagnosis not present

## 2018-06-18 DIAGNOSIS — Z79899 Other long term (current) drug therapy: Secondary | ICD-10-CM | POA: Diagnosis not present

## 2018-06-18 DIAGNOSIS — Z955 Presence of coronary angioplasty implant and graft: Secondary | ICD-10-CM | POA: Insufficient documentation

## 2018-06-18 DIAGNOSIS — I5022 Chronic systolic (congestive) heart failure: Secondary | ICD-10-CM | POA: Diagnosis not present

## 2018-06-18 LAB — BASIC METABOLIC PANEL
Anion gap: 9 (ref 5–15)
BUN: 11 mg/dL (ref 6–20)
CALCIUM: 9.3 mg/dL (ref 8.9–10.3)
CO2: 23 mmol/L (ref 22–32)
CREATININE: 0.99 mg/dL (ref 0.61–1.24)
Chloride: 108 mmol/L (ref 98–111)
GLUCOSE: 104 mg/dL — AB (ref 70–99)
Potassium: 4.6 mmol/L (ref 3.5–5.1)
SODIUM: 140 mmol/L (ref 135–145)

## 2018-06-18 LAB — CBC
HCT: 42.6 % (ref 39.0–52.0)
HEMOGLOBIN: 13.6 g/dL (ref 13.0–17.0)
MCH: 29.2 pg (ref 26.0–34.0)
MCHC: 31.9 g/dL (ref 30.0–36.0)
MCV: 91.6 fL (ref 78.0–100.0)
PLATELETS: 323 10*3/uL (ref 150–400)
RBC: 4.65 MIL/uL (ref 4.22–5.81)
RDW: 13.4 % (ref 11.5–15.5)
WBC: 5.3 10*3/uL (ref 4.0–10.5)

## 2018-06-18 LAB — LIPID PANEL
CHOLESTEROL: 83 mg/dL (ref 0–200)
HDL: 28 mg/dL — ABNORMAL LOW (ref >40)
LDL Cholesterol: 31 mg/dL (ref 0–99)
TRIGLYCERIDES: 118 mg/dL (ref <150)
Total CHOL/HDL Ratio: 3 RATIO
VLDL: 24 mg/dL (ref 0–40)

## 2018-06-18 NOTE — Progress Notes (Signed)
PCP: Dr. Ermalinda Memos Cardiology: Dr. Wyline Mood HF Cardiology: Dr. Shirlee Latch  62 y.o.with history of paroxysmal atrial fibrillation, CAD, and chronic systolic CHF was referred by Dr. Wyline Mood for CHF clinic evaluation.    Patient had no cardiac history prior to 2/18.  In 2/18, he developed the onset of exertional dyspnea. After steadily worsening dyspnea, he went to the ER at [facility] and was admitted.  Echo in 2/18 showed EF < 20% with severe MR.  He went back into NSR spontaneously.  He followed up with Dr. Wyline Mood and was set up for a cath in 3/18.  He was found to have a chronically occluded LCx and a long proximal to mid LAD stenosis.  No intervention was done.  He was noted to be back in rapid atrial fibrillation and was admitted.  He was diuresed and started on amiodarone, he converted back to NSR again spontaneously.    Underwent successful CTO procedure in 8/18 with DES to LCx, DES to pLAD, DES to dLAD.   In 10/18, he had atrial fibrillation ablation.  Now off amiodarone.   Cardiac MRI in 12/18 showed EF up to 44%.   Today he returns for HF follow up. Overall feeling just ok. Has ongoing fatigue. Mild sob with steps.   Denies PND/Orthopnea. Appetite ok. No fever or chills. Weight is down 3 lbs since last appointment. Taking all medications. [facility]. He has not been able to start nocturnal oxygen because [employer] would not pay for it. 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 Retired from [employer]    Labs (4/18): K 4.5, creatinine 1.0 Labs (5/18): K 4.2, creatinine 1.25, LFTs normal, TSH normal Labs (6/18): LDL 14, HDL 28 Labs (8/18): K 4.1 => 3.9, creatinine 1.0 => 1.14, LFTs normal, TSH normal Labs (10/18): hgb 13.8, K 4.4, creatinine 1.02 Labs (1/19): hgb 13.6, TSH normal, K 4.6, creatinine 1.05 Labs (02/11/2018): K 4.2 Creatinine 0.99  PMH: 1. Atrial fibrillation: Paroxysmal.  2. 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  - Echo (2/18) with mild LV dilation, EF < 20%, severe mitral regurgitation.  - RHC/LHC (3/18): long 80% proximal-mid LAD stenosis, total occlusion of the LCx with left to left collaterals, RCA ok. Mean RA 7, PA 32/10, mean PCWP 16, LVEDP 12, CI 1.54.  - Echo (6/18) with EF 30-35%, diffuse hypokinesis, normal RV size and systolic function, trivial MR.  - Echo (12/18): EF 35%, diffuse hypokinesis, normal RV size with mildly decreased systolic function.  - Cardiac MRI (12/18): EF 44%, small area of LGE in mid anteroseptal wall suggestive of prior infarction.  3. CAD: LHC (3/18) with long 80% proximal-mid LAD stenosis, total occlusion of the LCx with left to left collaterals, RCA ok. - 8/18: CTO intervention with DES to LCx and DES x 2 to proximal and mid LAD.  4. Mitral regurgitation: Severe on 2/18 echo.  Suspect functional, as MR only trivial on 6/18 echo.  5. Sleep study (6/18) with no OSA but nocturnal hypoxemia.  He was instructed to start 2 liters oxygen at night but his insurance would not cover.   6. Allergic rhinitis  SH: Bus driver and school custodian, lives in [place], quit smoking in 2011, married.   FH: Grandfather with MIs, mother with CABG and CHF.   Review of systems complete and found to be negative unless listed  in HPI.    Current Outpatient Medications  Medication Sig Dispense Refill  . acetaminophen (TYLENOL) 325 MG tablet Take 650 mg by mouth every 6 (six) hours as needed (FOR HEADACHES/SINUS ISSUES.).    Marland Kitchen apixaban (ELIQUIS) 5 MG TABS tablet Take 1 tablet (5 mg total) by mouth 2 (two) times daily. 180 tablet 3  . atorvastatin (LIPITOR) 80 MG tablet TAKE 1 TABLET DAILY AT 6 P.M. 90 tablet 3  . clopidogrel (PLAVIX) 75 MG tablet Take 1 tablet (75 mg total) by mouth daily. 90 tablet 3  . DULoxetine (CYMBALTA) 60 MG capsule Take 1 capsule (60 mg total) by mouth daily. 90  capsule 0  . ENTRESTO 24-26 MG TAKE 1 TABLET TWICE A DAY (DISCONTINUE ORDER FOR LISINOPRIL) 60 tablet 3  . finasteride (PROSCAR) 5 MG tablet Take 1 tablet (5 mg total) by mouth daily. 90 tablet 3  . furosemide (LASIX) 20 MG tablet Take 1 tablet (20 mg total) by mouth daily as needed (for swelling or weight gain of 3 Lbs.). 30 tablet 3  . metoprolol succinate (TOPROL XL) 25 MG 24 hr tablet Take 1 tablet (25 mg total) by mouth 2 (two) times daily. 180 tablet 3  . Omega-3 Fatty Acids (FISH OIL) 1000 MG CAPS Take 1,000 mg by mouth every other day.     . potassium chloride (K-DUR,KLOR-CON) 10 MEQ tablet TAKE ONLY ON DAYS WHEN TAKING LASIX 90 tablet 3  . spironolactone (ALDACTONE) 25 MG tablet Take 1 tablet (25 mg total) by mouth at bedtime. 90 tablet 3  . tamsulosin (FLOMAX) 0.4 MG CAPS capsule Take 1 capsule (0.4 mg total) by mouth daily. 180 capsule 3  . nitroGLYCERIN (NITROSTAT) 0.4 MG SL tablet Place 1 tablet (0.4 mg total) under the tongue every 5 (five) minutes as needed. (Patient not taking: Reported on 06/18/2018) 25 tablet 2   No current facility-administered medications for this encounter.    BP 110/76   Pulse 85   Wt 122.4 kg (269 lb 12.8 oz)   SpO2 95%   BMI 32.84 kg/m    Wt Readings from Last 3 Encounters:  06/18/18 122.4 kg (269 lb 12.8 oz)  06/14/18 124.7 kg (275 lb)  02/11/18 123.7 kg (272 lb 12 oz)    General:  Well appearing. No resp difficulty HEENT: normal Neck: supple. no JVD. Carotids 2+ bilat; no bruits. No lymphadenopathy or thryomegaly appreciated. Cor: PMI nondisplaced. Regular rate & rhythm. No rubs, gallops or murmurs. Lungs: clear Abdomen: soft, nontender, nondistended. No hepatosplenomegaly. No bruits or masses. Good bowel sounds. Extremities: no cyanosis, clubbing, rash, edema Neuro: alert & orientedx3, cranial nerves grossly intact. moves all 4 extremities w/o difficulty. Affect pleasant EKG: NSR 77 bpm   Assessment/Plan: 1. Atrial fibrillation:  Paroxysmal.  Now s/p atrial fibrillation ablation in 10/18.  NSR today.  He is now off amiodarone.  -Maintaining NSR  - Continue Eliquis for anticoagulation.    2. 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  Echo in 2/18 with EF < 20%.  EF 30-35% in 6/18 and 35% on repeat echo 12/18. Cardiac MRI in 12/18 to determine need for ICD showed EF 44%.  -NYHA II-III. Volume status stable.   - Continue Toprol XL 25 mg bid.  - Continue Entresto 24/26 bid.   - Continue spironolactone 25 daily.  - Repeat ECHO in December.Repeat echo at 1 year to make sure that it remains improved (12/19).  3. CAD: s/p DES to LCx and LAD in 05/2017.   -  Denies chest pain, Denies chest pain.  - Continue Plavix and Eliquis, no longer taking ASA as he completed 30 days of triple therapy.  - Stop Plavix after a year => end of 8/19. Stop today  - Continue statin.  4. Sleep study showed nocturnal hypoxemia but no OSA.  -Needs night time oxygen. [employer] would not pay for night time oxygen. I have asked him to go to [place] to try and obtain oxygen. He will follow up and try to establish care at [facility] [place] . 5. Mitral regurgitation: Minimal on 12/18 echo.    Follow up December months with an ECHO. Check BMET   Tonye Becket, NP  06/18/2018   Patient seen with NP, agree with the above note.  He is stable symptomatically.  Still getting fatigued by the end of the day but working long hours.  No chest pain.  He is in NSR today.  No significant exertional dyspnea. He is not volume overloaded on exam.  - Check BMET, lipids, CBC today.  - He can stop Plavix today, has been on it for a year since PCI and will continue apixaban 5 mg bid for PAF.  - He will followup with echo in 12/18.   Marca Ancona 06/19/2018

## 2018-06-18 NOTE — Patient Instructions (Signed)
Stop Plavix  Labs today  [facility]  Your physician recommends that you schedule a follow-up appointment in: 3 months with echocardiogram

## 2018-06-22 IMAGING — DX DG CHEST 2V
2 series · 2 of 2 positions shown · non-contrast
Comparison: None.

CLINICAL DATA: Wheezing and shortness of Breath

EXAM:
CHEST  2 VIEW

[chest pa]
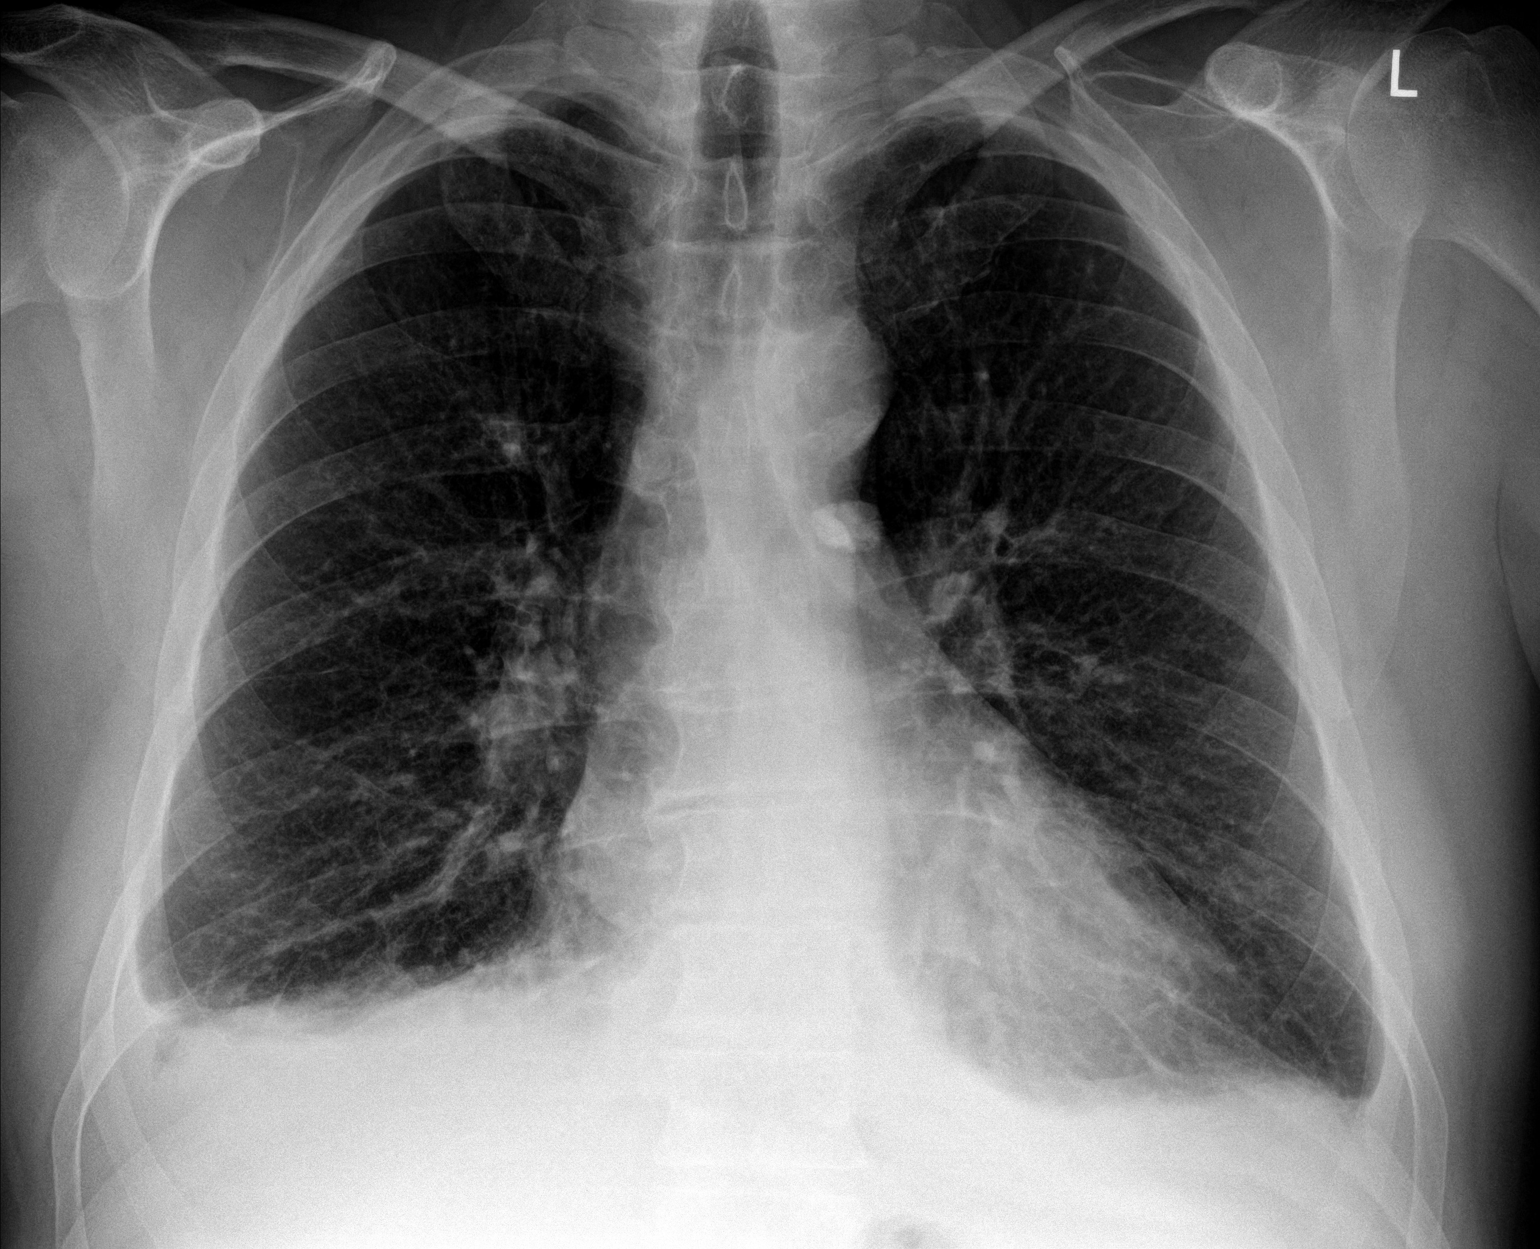

[chest lat]
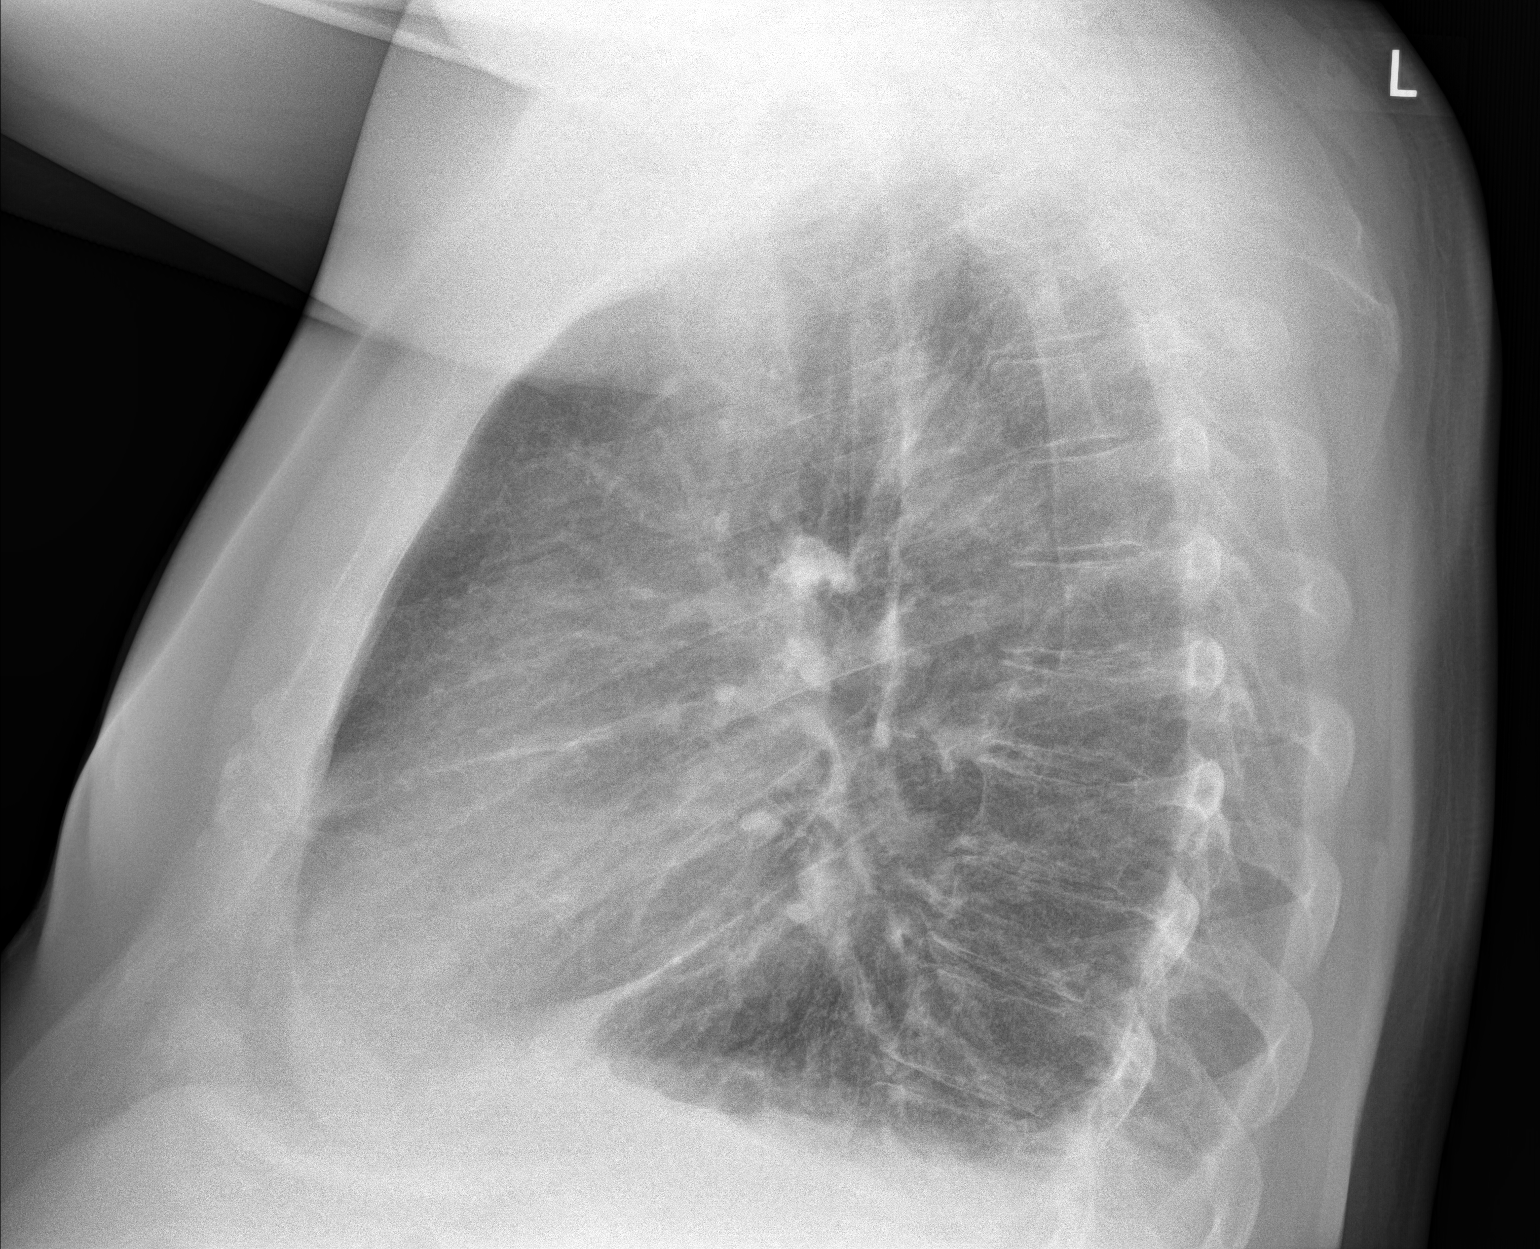

[2 of 2 positions shown; findings below may reference images not displayed]

FINDINGS: Cardiac shadow is within normal limits. The lungs are well aerated
bilaterally. Bibasilar atelectatic changes and small effusions are
seen. No bony abnormality is noted.
IMPRESSION: Bibasilar atelectatic changes and small effusions.

## 2018-08-13 ENCOUNTER — Other Ambulatory Visit (HOSPITAL_COMMUNITY): Payer: Self-pay | Admitting: Cardiology

## 2018-08-20 ENCOUNTER — Other Ambulatory Visit (HOSPITAL_COMMUNITY): Payer: Self-pay | Admitting: Cardiology

## 2018-08-20 ENCOUNTER — Other Ambulatory Visit: Payer: Self-pay | Admitting: Family Medicine

## 2018-08-21 ENCOUNTER — Other Ambulatory Visit: Payer: Self-pay | Admitting: Cardiology

## 2018-08-31 ENCOUNTER — Other Ambulatory Visit (HOSPITAL_COMMUNITY): Payer: Self-pay | Admitting: Cardiology

## 2018-09-13 ENCOUNTER — Other Ambulatory Visit: Payer: Self-pay | Admitting: *Deleted

## 2018-09-13 MED ORDER — FINASTERIDE 5 MG PO TABS: 5.0000 mg | ORAL_TABLET | Freq: Every day | ORAL | 0 refills | Status: DC

## 2018-09-13 MED ORDER — TAMSULOSIN HCL 0.4 MG PO CAPS: 0.4000 mg | ORAL_CAPSULE | Freq: Every day | ORAL | 0 refills | Status: DC

## 2018-09-17 ENCOUNTER — Ambulatory Visit (HOSPITAL_COMMUNITY)
Admission: RE | Admit: 2018-09-17 | Discharge: 2018-09-17 | Disposition: A | Discharge status: Home / Self Care | Source: Ambulatory Visit | Attending: Cardiology | Admitting: Cardiology

## 2018-09-17 ENCOUNTER — Ambulatory Visit (HOSPITAL_COMMUNITY)
Admission: RE | Admit: 2018-09-17 | Discharge: 2018-09-17 | Disposition: A | Discharge status: Home / Self Care | Source: Ambulatory Visit | Attending: Family Medicine | Admitting: Family Medicine

## 2018-09-17 ENCOUNTER — Ambulatory Visit (HOSPITAL_BASED_OUTPATIENT_CLINIC_OR_DEPARTMENT_OTHER)
Admission: RE | Admit: 2018-09-17 | Discharge: 2018-09-17 | Disposition: A | Discharge status: Home / Self Care | Source: Ambulatory Visit | Attending: Cardiology | Admitting: Cardiology

## 2018-09-17 VITALS — BP 118/68 | HR 80 | Wt 260.0 lb

## 2018-09-17 DIAGNOSIS — I4819 Other persistent atrial fibrillation: Secondary | ICD-10-CM

## 2018-09-17 DIAGNOSIS — I340 Nonrheumatic mitral (valve) insufficiency: Secondary | ICD-10-CM | POA: Diagnosis not present

## 2018-09-17 DIAGNOSIS — Z87891 Personal history of nicotine dependence: Secondary | ICD-10-CM | POA: Diagnosis not present

## 2018-09-17 DIAGNOSIS — Z79899 Other long term (current) drug therapy: Secondary | ICD-10-CM | POA: Insufficient documentation

## 2018-09-17 DIAGNOSIS — I480 Paroxysmal atrial fibrillation: Secondary | ICD-10-CM | POA: Insufficient documentation

## 2018-09-17 DIAGNOSIS — I493 Ventricular premature depolarization: Secondary | ICD-10-CM | POA: Diagnosis not present

## 2018-09-17 DIAGNOSIS — Z7901 Long term (current) use of anticoagulants: Secondary | ICD-10-CM | POA: Insufficient documentation

## 2018-09-17 DIAGNOSIS — I5022 Chronic systolic (congestive) heart failure: Secondary | ICD-10-CM | POA: Insufficient documentation

## 2018-09-17 DIAGNOSIS — Z8249 Family history of ischemic heart disease and other diseases of the circulatory system: Secondary | ICD-10-CM | POA: Diagnosis not present

## 2018-09-17 DIAGNOSIS — I2510 Atherosclerotic heart disease of native coronary artery without angina pectoris: Secondary | ICD-10-CM | POA: Diagnosis not present

## 2018-09-17 DIAGNOSIS — Z955 Presence of coronary angioplasty implant and graft: Secondary | ICD-10-CM | POA: Insufficient documentation

## 2018-09-17 LAB — BASIC METABOLIC PANEL
ANION GAP: 7 (ref 5–15)
BUN: 14 mg/dL (ref 6–20)
CHLORIDE: 110 mmol/L (ref 98–111)
CO2: 22 mmol/L (ref 22–32)
Calcium: 9.3 mg/dL (ref 8.9–10.3)
Creatinine, Ser: 0.87 mg/dL (ref 0.61–1.24)
GFR calc Af Amer: >60 mL/min (ref >60)
GLUCOSE: 107 mg/dL — AB (ref 70–99)
Potassium: 4.3 mmol/L (ref 3.5–5.1)
Sodium: 139 mmol/L (ref 135–145)

## 2018-09-17 MED ORDER — SACUBITRIL-VALSARTAN 49-51 MG PO TABS: 1.0000 | ORAL_TABLET | Freq: Two times a day (BID) | ORAL | 6 refills | Status: DC

## 2018-09-17 MED ORDER — SACUBITRIL-VALSARTAN 49-51 MG PO TABS: 1.0000 | ORAL_TABLET | Freq: Two times a day (BID) | ORAL | 3 refills | Status: DC

## 2018-09-17 NOTE — Progress Notes (Signed)
  Echocardiogram 2D Echocardiogram has been performed.  Tye Savoy 09/17/2018, 12:00 PM

## 2018-09-17 NOTE — Patient Instructions (Addendum)
INCREASE Entresto 49/51mg  (1 tab) twice a day  Labs today We will only contact you if something comes back abnormal or we need to make some changes. Otherwise no news is good news!  Repeat labs in 10 days. Please fax results to [phone]  Ziopatch to be worn for 3 days. Instructions given.   Your physician recommends that you schedule a follow-up appointment in: 6 weeks in NP/PA clinic and 3 months with Dr. Shirlee Latch.

## 2018-09-18 NOTE — Progress Notes (Signed)
PCP: Raliegh Ip, DO Cardiology: Dr. Wyline Mood HF Cardiology: Dr. Shirlee Latch  62 y.o.with history of paroxysmal atrial fibrillation, CAD, and chronic systolic CHF was referred by Dr. Wyline Mood for CHF clinic evaluation.    Patient had no cardiac history prior to 2/18.  In 2/18, he developed the onset of exertional dyspnea. After steadily worsening dyspnea, he went to the ER at [facility] and was admitted.  Echo in 2/18 showed EF < 20% with severe MR.  He went back into NSR spontaneously.  He followed up with Dr. Wyline Mood and was set up for a cath in 3/18.  He was found to have a chronically occluded LCx and a long proximal to mid LAD stenosis.  No intervention was done.  He was noted to be back in rapid atrial fibrillation and was admitted.  He was diuresed and started on amiodarone, he converted back to NSR again spontaneously.   Underwent successful CTO procedure in 8/18 with DES to LCx, DES to pLAD, DES to dLAD.   In 10/18, he had atrial fibrillation ablation.  Now off amiodarone.   Cardiac MRI in 12/18 showed EF up to 44%.  Echo in 12/19 showed EF 40%.   He returns for followup of CHF.  He is doing well.  Weight is down 9 lbs. 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 He has also been remodeling his house.  He is not short of breath walking on flat ground. He notes dyspnea with heavy lifting. No orthopnea/PND.  Frequent PVCs noted today during echo.  No chest pain.   Labs (4/18): K 4.5, creatinine 1.0 Labs (5/18): K 4.2, creatinine 1.25, LFTs normal, TSH normal Labs (6/18): LDL 14, HDL 28 Labs (8/18): K 4.1 => 3.9, creatinine 1.0 => 1.14, LFTs normal, TSH normal Labs (10/18): hgb 13.8, K 4.4, creatinine 1.02 Labs (1/19): hgb 13.6, TSH normal, K 4.6, creatinine 1.05 Labs (02/11/2018): K 4.2 Creatinine 0.99 Labs (9/19): K 4.6, creatinine 0.99, hgb 13.6, LDL 31, HDL 28  PMH: 1. Atrial fibrillation: Paroxysmal.  2. Chronic systolic CHF: Probably mixed ischemic/nonischemic  cardiomyopathy.  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  - Echo (2/18) with mild LV dilation, EF < 20%, severe mitral regurgitation.  - RHC/LHC (3/18): long 80% proximal-mid LAD stenosis, total occlusion of the LCx with left to left collaterals, RCA ok. Mean RA 7, PA 32/10, mean PCWP 16, LVEDP 12, CI 1.54.  - Echo (6/18) with EF 30-35%, diffuse hypokinesis, normal RV size and systolic function, trivial MR.  - Echo (12/18): EF 35%, diffuse hypokinesis, normal RV size with mildly decreased systolic function.  - Cardiac MRI (12/18): EF 44%, small area of LGE in mid anteroseptal wall suggestive of prior infarction. - Echo (12/19): EF 40%, diffuse hypokinesis, normal RV size with mildly decreased systolic function.   3. CAD: LHC (3/18) with long 80% proximal-mid LAD stenosis, total occlusion of the LCx with left to left collaterals, RCA ok. - 8/18: CTO intervention with DES to LCx and DES x 2 to proximal and mid LAD.  4. Mitral regurgitation: Severe on 2/18 echo.  Suspect functional, as MR only trivial on 6/18 echo.  5. Sleep study (6/18) with no OSA but nocturnal hypoxemia.  He was instructed to start 2 liters oxygen at night but his insurance would not cover.   6. Allergic rhinitis 7. PVCs  SH: Bus driver and school custodian, lives in [place], quit smoking in 2011, married.   FH:  Grandfather with MIs, mother with CABG and CHF.   Review of systems complete and found to be negative unless listed in HPI.    Current Outpatient Medications  Medication Sig Dispense Refill  . acetaminophen (TYLENOL) 325 MG tablet Take 650 mg by mouth every 6 (six) hours as needed (FOR HEADACHES/SINUS ISSUES.).    Marland Kitchen atorvastatin (LIPITOR) 80 MG tablet TAKE 1 TABLET DAILY AT 6 P.M. 90 tablet 2  . DULoxetine (CYMBALTA) 60 MG capsule Take 1 capsule (60 mg total) by mouth daily. (Needs to be seen before next refill) 90 capsule 0  . ELIQUIS 5 MG TABS tablet TAKE 1 TABLET TWICE A DAY 180 tablet 4    . finasteride (PROSCAR) 5 MG tablet Take 1 tablet (5 mg total) by mouth daily. 90 tablet 0  . Omega-3 Fatty Acids (FISH OIL) 1000 MG CAPS Take 1,000 mg by mouth every other day.     . spironolactone (ALDACTONE) 25 MG tablet Take 1 tablet (25 mg total) by mouth at bedtime. 90 tablet 3  . TOPROL XL 25 MG 24 hr tablet TAKE 1 TABLET TWICE A DAY (NEW MEDICATION 12/11/16) 180 tablet 2  . furosemide (LASIX) 20 MG tablet Take 1 tablet (20 mg total) by mouth daily as needed (for swelling or weight gain of 3 Lbs.). (Patient not taking: Reported on 09/17/2018) 30 tablet 3  . nitroGLYCERIN (NITROSTAT) 0.4 MG SL tablet Place 1 tablet (0.4 mg total) under the tongue every 5 (five) minutes as needed. (Patient not taking: Reported on 06/18/2018) 25 tablet 2  . potassium chloride (K-DUR,KLOR-CON) 10 MEQ tablet TAKE ONLY ON DAYS WHEN TAKING LASIX (Patient not taking: Reported on 09/17/2018) 90 tablet 3  . sacubitril-valsartan (ENTRESTO) 49-51 MG Take 1 tablet by mouth 2 (two) times daily. 180 tablet 3  . tamsulosin (FLOMAX) 0.4 MG CAPS capsule Take 1 capsule (0.4 mg total) by mouth daily. (Patient not taking: Reported on 09/17/2018) 90 capsule 0   No current facility-administered medications for this encounter.    BP 118/68   Pulse 80   Wt 117.9 kg (260 lb)   SpO2 98%   BMI 31.65 kg/m    Wt Readings from Last 3 Encounters:  09/17/18 117.9 kg (260 lb)  06/18/18 122.4 kg (269 lb 12.8 oz)  06/14/18 124.7 kg (275 lb)    General: NAD Neck: Thick, no JVD, no thyromegaly or thyroid nodule.  Lungs: Clear to auscultation bilaterally with normal respiratory effort. CV: Nondisplaced PMI.  Heart regular S1/S2, no S3/S4, no murmur.  No peripheral edema.  No carotid bruit.  Normal pedal pulses.  Abdomen: Soft, nontender, no hepatosplenomegaly, no distention.  Skin: Intact without lesions or rashes.  Neurologic: Alert and oriented x 3.  Psych: Normal affect. Extremities: No clubbing or cyanosis.  HEENT: Normal.    Assessment/Plan: 1. Atrial fibrillation: Paroxysmal.  Now s/p atrial fibrillation ablation in 10/18.  NSR today.  He is now off amiodarone.  - Continue Eliquis for anticoagulation.   2. 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  Echo in 2/18 with EF < 20%.  EF 30-35% in 6/18 and 35% on repeat echo 12/18. Cardiac MRI in 12/18 to determine need for ICD showed EF 44%. Echo was done today and reviewed, EF 40%.  NYHA class II symptoms.  He is not volume overloaded on exam.  - Continue Toprol XL 25 mg bid.  - Increase Entresto to 49/51 bid, BMET today and again  in 10 days.    - Continue spironolactone 25 daily.  3. CAD: s/p DES to LCx and LAD in 05/2017.  No chest pain.  - No ASA given stable CAD and use of Eliquis.  - Continue statin. Good lipids in 9/19.  4. Mitral regurgitation: Minimal on today's echo.   5. PVCs: Noted to be frequent on echo today, ?contributing to cardiomyopathy.  - I will arrange for 3 day Zio patch to quantify PVCs.   Followup in 6 wks with NP/PA for medication titration.  See me in 3 months.   Marca Ancona 09/18/2018

## 2018-10-07 IMAGING — US US EXTREM UP*L* LTD
1 series · 14 of 22 positions shown · non-contrast
Comparison: None

CLINICAL DATA: Palpable abnormalities and tenderness in the axilla

EXAM:
ULTRASOUND BILATERAL UPPER EXTREMITY LIMITED
TECHNIQUE: Ultrasound examination of the upper extremity soft tissues was
performed in the area of clinical concern at the axilla bilaterally.

[Series 1: us extrem up*left* ltd · 0.09mm/px · 14 of 22 slices shown]
[im 1/22]
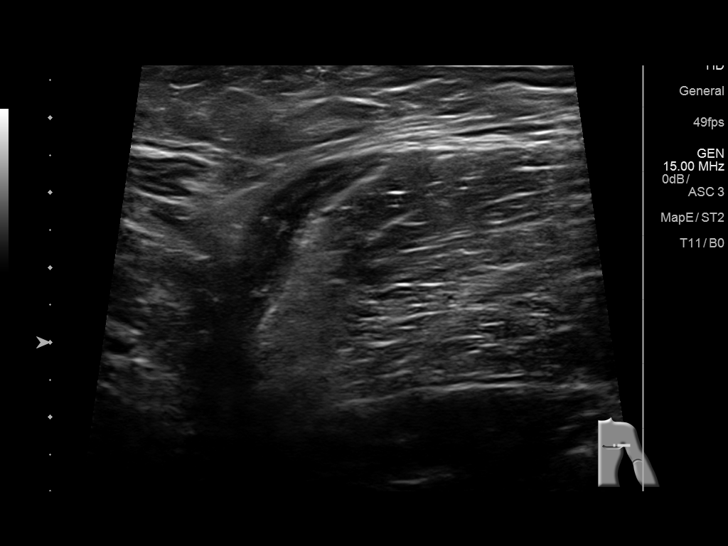
[im 3/22]
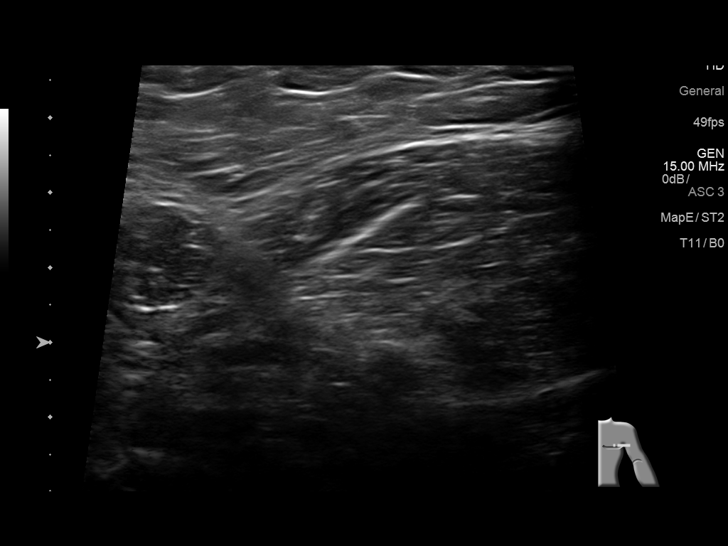
[im 4/22]
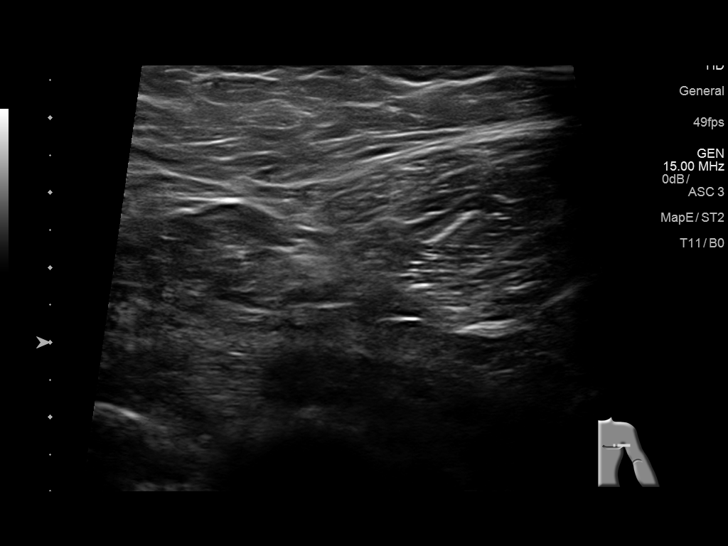
[im 6/22]
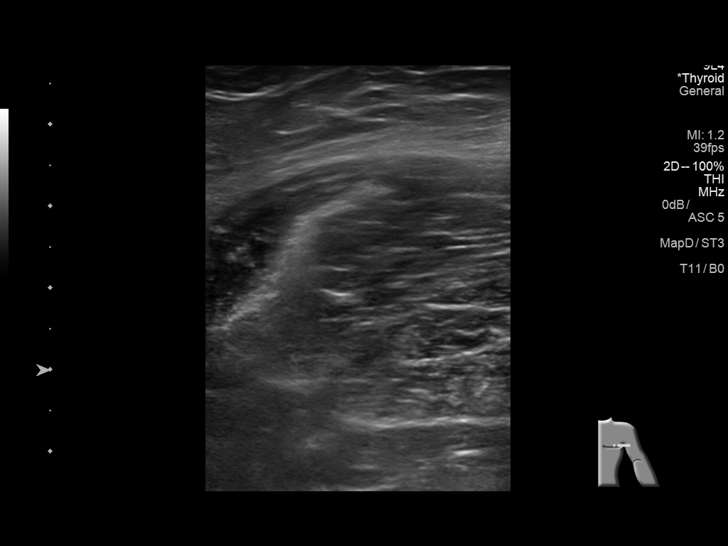
[im 8/22]
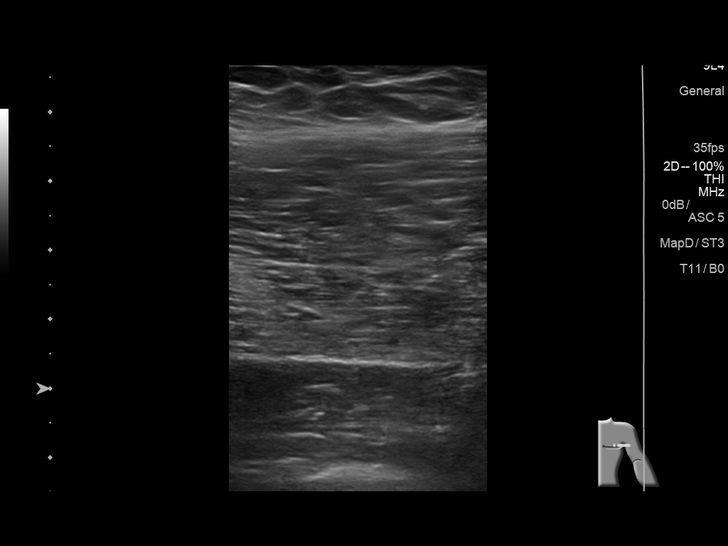
[im 9/22]
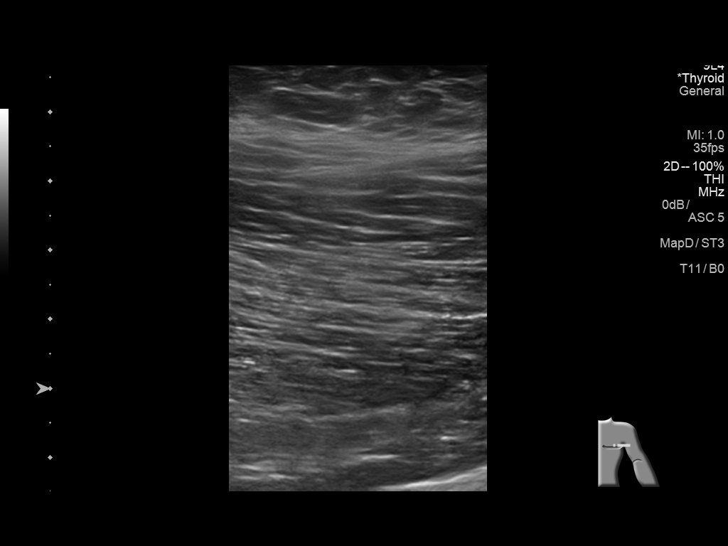
[im 11/22]
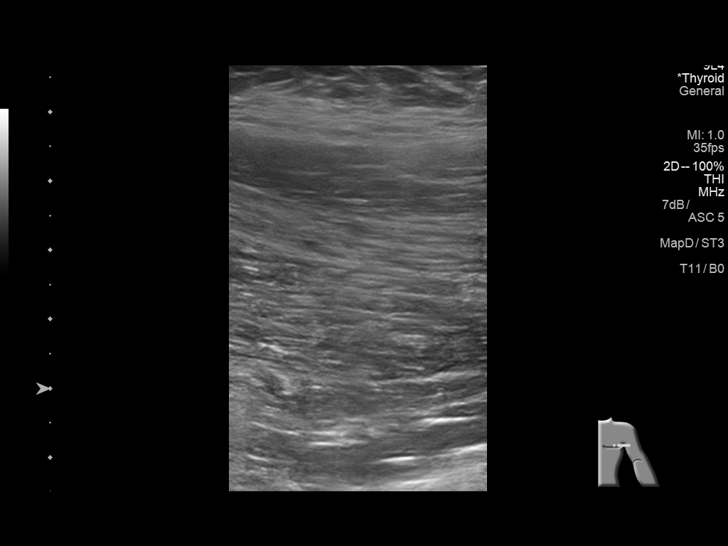
[im 12/22]
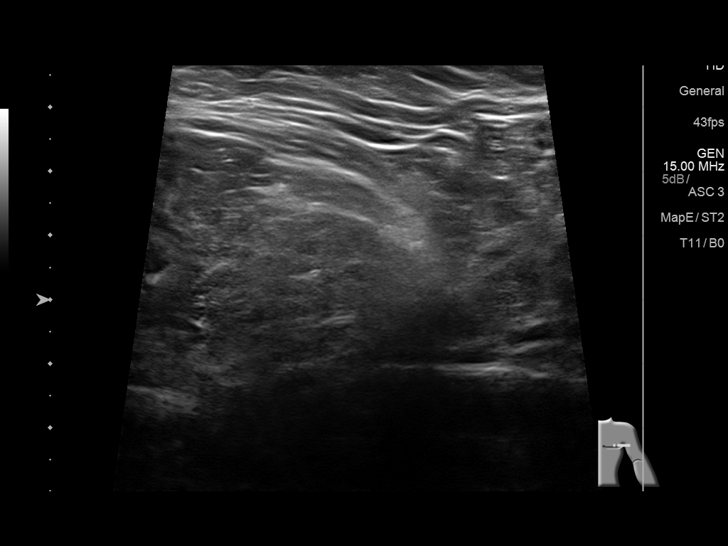
[im 14/22]
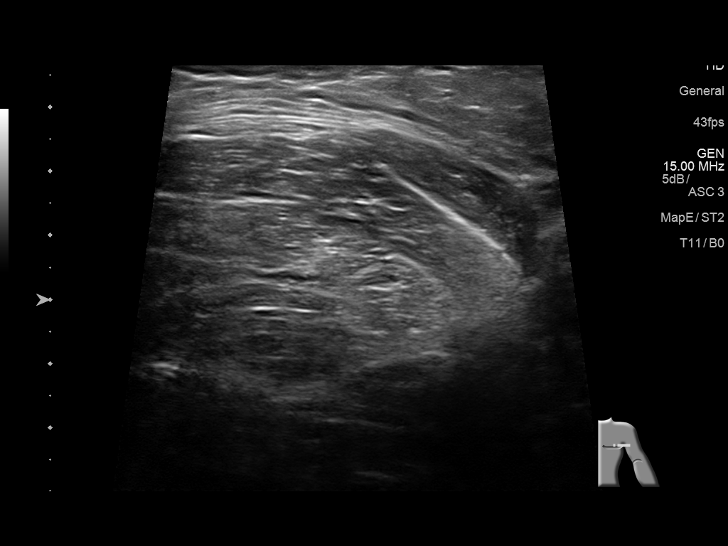
[im 15/22]
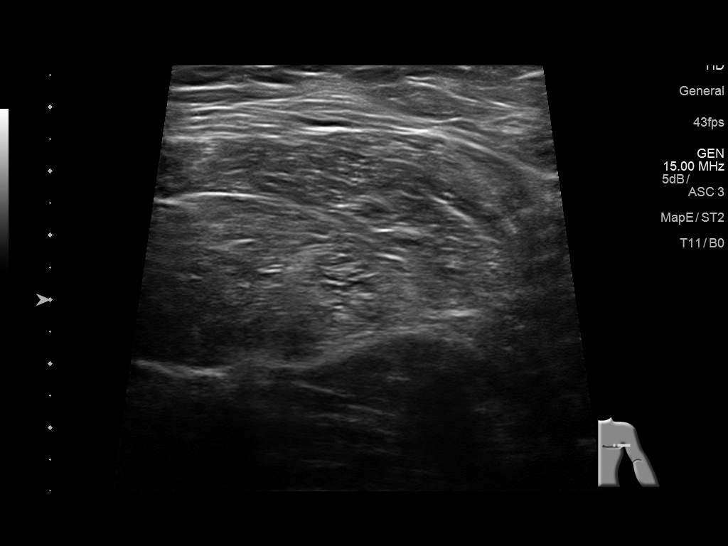
[im 17/22]
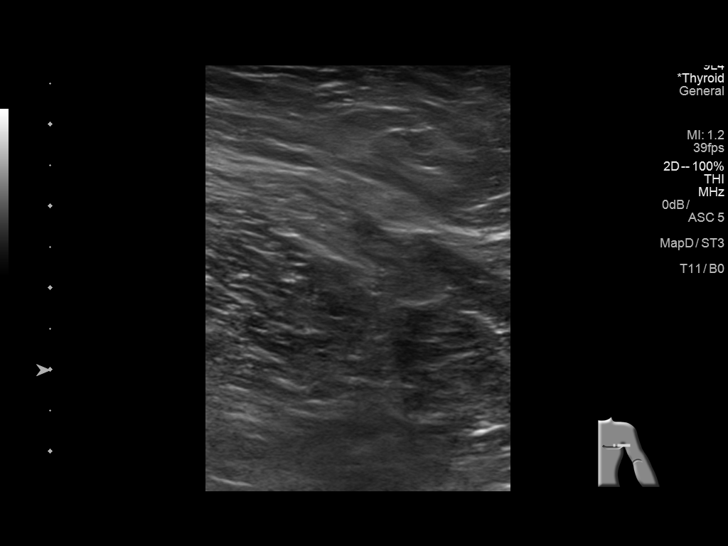
[im 19/22]
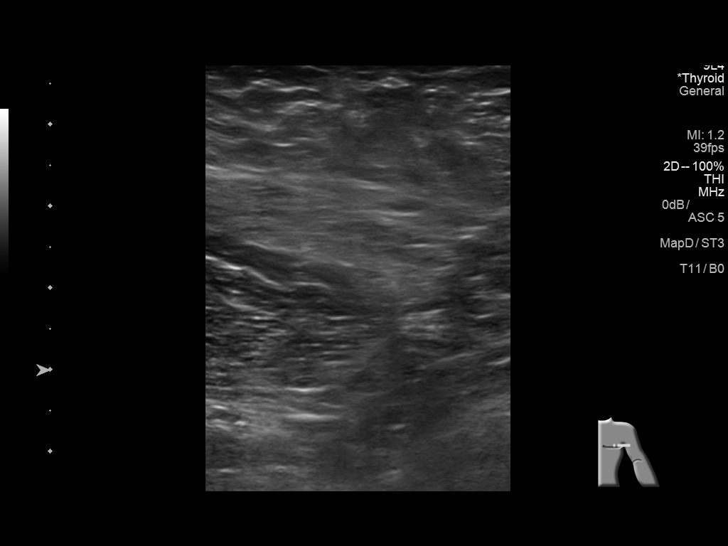
[im 20/22]
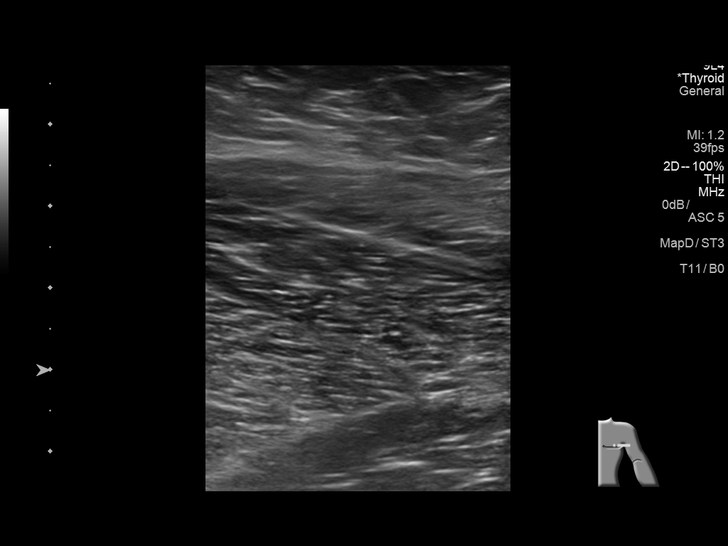
[im 22/22]
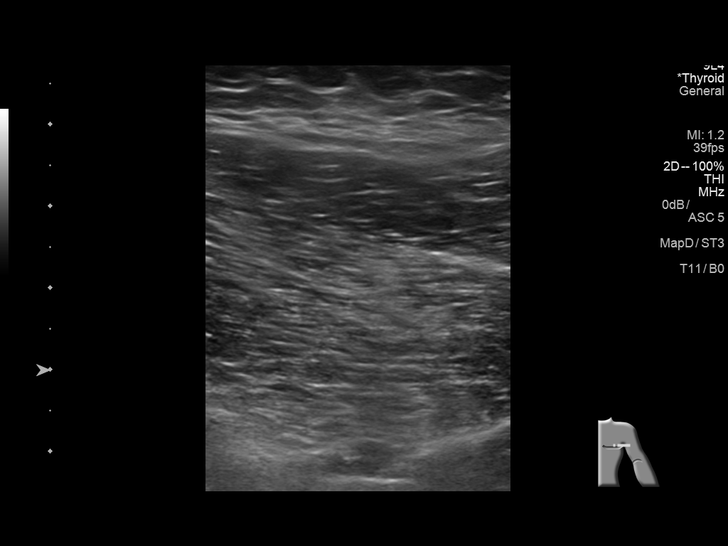

[14 of 22 positions shown; findings below may reference images not displayed]

FINDINGS: RIGHT axilla: Patient's tenderness corresponds to palpation and
transducer pressure upon the RIGHT latissimus muscle body. Muscle
echogenicity appears normal. No intramuscular edema or abnormal
echogenicity identified. Overlying subcutaneous tissues are normal
in appearance without abnormal fluid collection or axillary
adenopathy.

LEFT axilla: Patient's tenderness corresponds to palpation and
transducer pressure upon the RIGHT latissimus muscle body. Muscle
echogenicity appears normal. No intramuscular edema or abnormal
echogenicity identified. Overlying subcutaneous tissues are normal
in appearance without abnormal fluid collection or axillary
adenopathy.
IMPRESSION: Tenderness at the latissimus dorsi muscles bilaterally, re-creating
patient's pain.

No axillary mass, adenopathy or abnormal fluid collection identified
in either axilla.

## 2018-10-08 ENCOUNTER — Encounter (HOSPITAL_COMMUNITY): Payer: Self-pay | Admitting: *Deleted

## 2018-10-28 NOTE — Progress Notes (Signed)
PCP: Raliegh Ip, DO Cardiology: Dr. Wyline Mood HF Cardiology: Dr. Walker Kehr is a 63 y.o. male with history of paroxysmal atrial fibrillation, CAD, and chronic systolic CHF was referred by Dr. Wyline Mood for CHF clinic evaluation.    Patient had no cardiac history prior to 2/18.  In 2/18, he developed the onset of exertional dyspnea. After steadily worsening dyspnea, he went to the ER at [facility] and was admitted.  Echo in 2/18 showed EF < 20% with severe MR.  He went back into NSR spontaneously.  He followed up with Dr. Wyline Mood and was set up for a cath in 3/18.  He was found to have a chronically occluded LCx and a long proximal to mid LAD stenosis.  No intervention was done.  He was noted to be back in rapid atrial fibrillation and was admitted.  He was diuresed and started on amiodarone, he converted back to NSR again spontaneously.   Underwent successful CTO procedure in 8/18 with DES to LCx, DES to pLAD, DES to dLAD.   In 10/18, he had atrial fibrillation ablation.  Now off amiodarone.   Cardiac MRI in 12/18 showed EF up to 44%.  Echo in 12/19 showed EF 40%.   He presents today for regular follow up. Last visit Entresto increased and Zio patch placed. No significant amount of PVCs but + VT up to 10 beats. Referred back to see EP. He has not heard from them yet. 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 Wants to avoid ICD if at all possible. He denies SOB on flat ground. He recently insulated his house (remodeling) with no difficulty. He is occasional SOB when he gets in a hurry, but can other times walk up an incline without getting SOB. This depends on the weather and his volume status. No orthopnea/PND. Taking all medications as directed. He denies CP or palpitations.   Labs (4/18): K 4.5, creatinine 1.0 Labs (5/18): K 4.2, creatinine 1.25, LFTs normal, TSH normal Labs (6/18): LDL 14, HDL 28 Labs (8/18): K 4.1 => 3.9, creatinine 1.0 => 1.14, LFTs  normal, TSH normal Labs (10/18): hgb 13.8, K 4.4, creatinine 1.02 Labs (1/19): hgb 13.6, TSH normal, K 4.6, creatinine 1.05 Labs (02/11/2018): K 4.2 Creatinine 0.99 Labs (9/19): K 4.6, creatinine 0.99, hgb 13.6, LDL 31, HDL 28  PMH: 1. Atrial fibrillation: Paroxysmal.  2. 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  - Echo (2/18) with mild LV dilation, EF < 20%, severe mitral regurgitation.  - RHC/LHC (3/18): long 80% proximal-mid LAD stenosis, total occlusion of the LCx with left to left collaterals, RCA ok. Mean RA 7, PA 32/10, mean PCWP 16, LVEDP 12, CI 1.54.  - Echo (6/18) with EF 30-35%, diffuse hypokinesis, normal RV size and systolic function, trivial MR.  - Echo (12/18): EF 35%, diffuse hypokinesis, normal RV size with mildly decreased systolic function.  - Cardiac MRI (12/18): EF 44%, small area of LGE in mid anteroseptal wall suggestive of prior infarction. - Echo (12/19): EF 40%, diffuse hypokinesis, normal RV size with mildly decreased systolic function.   3. CAD: LHC (3/18) with long 80% proximal-mid LAD stenosis, total occlusion of the LCx with left to left collaterals, RCA ok. - 8/18: CTO intervention with DES to LCx and DES x 2 to proximal and mid LAD.  4. Mitral regurgitation: Severe on 2/18 echo.  Suspect functional, as MR only  trivial on 6/18 echo.  5. Sleep study (6/18) with no OSA but nocturnal hypoxemia.  He was instructed to start 2 liters oxygen at night but his insurance would not cover.   6. Allergic rhinitis 7. 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   FH: Grandfather with MIs, mother with CABG and CHF.   Review of systems complete and found to be negative unless listed in HPI.    Current Outpatient Medications  Medication Sig Dispense Refill  . acetaminophen (TYLENOL) 325 MG tablet Take 650 mg by mouth every 6  (six) hours as needed (FOR HEADACHES/SINUS ISSUES.).    Marland Kitchen. atorvastatin (LIPITOR) 80 MG tablet TAKE 1 TABLET DAILY AT 6 P.M. 90 tablet 2  . DULoxetine (CYMBALTA) 60 MG capsule Take 1 capsule (60 mg total) by mouth daily. (Needs to be seen before next refill) 90 capsule 0  . ELIQUIS 5 MG TABS tablet TAKE 1 TABLET TWICE A DAY 180 tablet 4  . finasteride (PROSCAR) 5 MG tablet Take 1 tablet (5 mg total) by mouth daily. 90 tablet 0  . furosemide (LASIX) 20 MG tablet Take 1 tablet (20 mg total) by mouth daily as needed (for swelling or weight gain of 3 Lbs.). (Patient not taking: Reported on 09/17/2018) 30 tablet 3  . nitroGLYCERIN (NITROSTAT) 0.4 MG SL tablet Place 1 tablet (0.4 mg total) under the tongue every 5 (five) minutes as needed. (Patient not taking: Reported on 06/18/2018) 25 tablet 2  . Omega-3 Fatty Acids (FISH OIL) 1000 MG CAPS Take 1,000 mg by mouth every other day.     . potassium chloride (K-DUR,KLOR-CON) 10 MEQ tablet TAKE ONLY ON DAYS WHEN TAKING LASIX (Patient not taking: Reported on 09/17/2018) 90 tablet 3  . sacubitril-valsartan (ENTRESTO) 49-51 MG Take 1 tablet by mouth 2 (two) times daily. 180 tablet 3  . spironolactone (ALDACTONE) 25 MG tablet Take 1 tablet (25 mg total) by mouth at bedtime. 90 tablet 3  . tamsulosin (FLOMAX) 0.4 MG CAPS capsule Take 1 capsule (0.4 mg total) by mouth daily. (Patient not taking: Reported on 09/17/2018) 90 capsule 0  . TOPROL XL 25 MG 24 hr tablet TAKE 1 TABLET TWICE A DAY (NEW MEDICATION 12/11/16) 180 tablet 2   No current facility-administered medications for this visit.    Vitals:   10/29/18 1125  BP: 120/78  Pulse: 78  SpO2: 95%  Weight: 121.4 kg (267 lb 9.6 oz)   Wt Readings from Last 3 Encounters:  10/29/18 121.4 kg (267 lb 9.6 oz)  09/17/18 117.9 kg (260 lb)  06/18/18 122.4 kg (269 lb 12.8 oz)    General: NAD HEENT: Normal Neck: Supple. JVP 5-6. Carotids 2+ bilat; no bruits. No thyromegaly or nodule noted. Cor: PMI nondisplaced. RRR,  No M/G/R noted Lungs: CTAB, normal effort. Abdomen: Soft, non-tender, non-distended, no HSM. No bruits or masses. +BS  Extremities: No cyanosis, clubbing, or rash. R and LLE no edema.  Neuro: Alert & orientedx3, cranial nerves grossly intact. moves all 4 extremities w/o difficulty. Affect pleasant   Assessment/Plan: 1. Atrial fibrillation: Paroxysmal.  Now s/p atrial fibrillation ablation in 10/18.  - EKG today shows Sinus rhythm 76 bpm with occasional PVCs.  He is now off amiodarone.  - Continue Eliquis for anticoagulation.   2. 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  Echo in 2/18 with EF < 20%.  EF 30-35% in 6/18 and  35% on repeat echo 12/18. Cardiac MRI in 12/18 to determine need for ICD showed EF 44%. NYHA class II symptoms.  Volume status stable on exam.   - Echo 09/2019 showed LVEF 40%.   - Continue lasix as needed.  - Continue Toprol XL 25 mg bid.  - Continue Entresto 49/51 mg BID.  - Continue spironolactone 25 daily.  3. CAD: s/p DES to LCx and LAD in 05/2017.   - No s/s of ischemia.    - No ASA given stable CAD and use of Eliquis.  - Continue statin. Good lipids in 9/19.  4. Mitral regurgitation: - Minimal on echo 09/17/2018   5. NSVT:  - Zio patch 09/17/2018 with primarily NSR, 1.1% PVCs, 4 runs of VT, longest 10 beats.  - Referred to EP.   Keep 2 month follow up with Dr. Shirlee Latch.  Graciella Freer, PA-C  10/28/2018   Greater than 50% of the 25 minute visit was spent in counseling/coordination of care regarding disease state education, salt/fluid restriction, sliding scale diuretics, and medication compliance.

## 2018-10-29 ENCOUNTER — Other Ambulatory Visit: Payer: Self-pay

## 2018-10-29 ENCOUNTER — Ambulatory Visit (HOSPITAL_COMMUNITY)
Admission: RE | Admit: 2018-10-29 | Discharge: 2018-10-29 | Disposition: A | Discharge status: Home / Self Care | Source: Ambulatory Visit | Attending: Cardiology | Admitting: Cardiology

## 2018-10-29 ENCOUNTER — Encounter (HOSPITAL_COMMUNITY): Payer: Self-pay

## 2018-10-29 VITALS — BP 120/78 | HR 78 | Wt 267.6 lb

## 2018-10-29 DIAGNOSIS — Z8249 Family history of ischemic heart disease and other diseases of the circulatory system: Secondary | ICD-10-CM | POA: Diagnosis not present

## 2018-10-29 DIAGNOSIS — Z955 Presence of coronary angioplasty implant and graft: Secondary | ICD-10-CM | POA: Diagnosis not present

## 2018-10-29 DIAGNOSIS — Z7901 Long term (current) use of anticoagulants: Secondary | ICD-10-CM | POA: Diagnosis not present

## 2018-10-29 DIAGNOSIS — I5022 Chronic systolic (congestive) heart failure: Secondary | ICD-10-CM | POA: Diagnosis not present

## 2018-10-29 DIAGNOSIS — Z79899 Other long term (current) drug therapy: Secondary | ICD-10-CM | POA: Insufficient documentation

## 2018-10-29 DIAGNOSIS — I340 Nonrheumatic mitral (valve) insufficiency: Secondary | ICD-10-CM | POA: Diagnosis not present

## 2018-10-29 DIAGNOSIS — I255 Ischemic cardiomyopathy: Secondary | ICD-10-CM | POA: Diagnosis not present

## 2018-10-29 DIAGNOSIS — I2510 Atherosclerotic heart disease of native coronary artery without angina pectoris: Secondary | ICD-10-CM | POA: Insufficient documentation

## 2018-10-29 DIAGNOSIS — I480 Paroxysmal atrial fibrillation: Secondary | ICD-10-CM | POA: Diagnosis not present

## 2018-10-29 DIAGNOSIS — Z87891 Personal history of nicotine dependence: Secondary | ICD-10-CM | POA: Diagnosis not present

## 2018-10-29 DIAGNOSIS — I472 Ventricular tachycardia: Secondary | ICD-10-CM | POA: Insufficient documentation

## 2018-10-29 LAB — BASIC METABOLIC PANEL
Anion gap: 10 (ref 5–15)
BUN: 15 mg/dL (ref 6–20)
CO2: 22 mmol/L (ref 22–32)
CREATININE: 0.9 mg/dL (ref 0.61–1.24)
Calcium: 9.2 mg/dL (ref 8.9–10.3)
Chloride: 108 mmol/L (ref 98–111)
GFR calc Af Amer: >60 mL/min (ref >60)
Glucose, Bld: 99 mg/dL (ref 70–99)
Potassium: 4.4 mmol/L (ref 3.5–5.1)
SODIUM: 140 mmol/L (ref 135–145)

## 2018-10-29 LAB — MAGNESIUM: MAGNESIUM: 2 mg/dL (ref 1.7–2.4)

## 2018-10-29 NOTE — Patient Instructions (Signed)
Labs were done today. We will call you with any ABNORMAL results.   EKG was done today.  You have been referred to [facility]; Cardiac Electrophysiology. They will contact you to schedule an appointment.   Please keep your follow up appointment with Dr Shirlee Latch

## 2018-11-27 ENCOUNTER — Other Ambulatory Visit (HOSPITAL_COMMUNITY): Payer: Self-pay | Admitting: Cardiology

## 2018-12-05 ENCOUNTER — Other Ambulatory Visit: Payer: Self-pay | Admitting: Cardiology

## 2018-12-05 ENCOUNTER — Other Ambulatory Visit

## 2018-12-06 LAB — BASIC METABOLIC PANEL
BUN / CREAT RATIO: 16 (ref 10–24)
BUN: 14 mg/dL (ref 8–27)
CO2: 22 mmol/L (ref 20–29)
Calcium: 9.5 mg/dL (ref 8.6–10.2)
Chloride: 104 mmol/L (ref 96–106)
Creatinine, Ser: 0.86 mg/dL (ref 0.76–1.27)
GFR calc Af Amer: 109 mL/min/{1.73_m2} (ref >59)
GFR calc non Af Amer: 94 mL/min/{1.73_m2} (ref >59)
Glucose: 108 mg/dL — ABNORMAL HIGH (ref 65–99)
Potassium: 4.5 mmol/L (ref 3.5–5.2)
Sodium: 142 mmol/L (ref 134–144)

## 2018-12-09 ENCOUNTER — Ambulatory Visit (INDEPENDENT_AMBULATORY_CARE_PROVIDER_SITE_OTHER): Admitting: Cardiology

## 2018-12-09 ENCOUNTER — Encounter: Payer: Self-pay | Admitting: Cardiology

## 2018-12-09 VITALS — BP 124/64 | HR 69 | Ht 76.0 in | Wt 274.0 lb

## 2018-12-09 DIAGNOSIS — I5022 Chronic systolic (congestive) heart failure: Secondary | ICD-10-CM | POA: Diagnosis not present

## 2018-12-09 MED ORDER — METOPROLOL SUCCINATE ER 50 MG PO TB24: 50.0000 mg | ORAL_TABLET | Freq: Every day | ORAL | 3 refills | Status: DC
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
## 2018-12-09 NOTE — Patient Instructions (Signed)
Medication Instructions:  Your physician has recommended you make the following change in your medication:  1. INCREASE Toprol to 50 mg daily  * If you need a refill on your cardiac medications before your next appointment, please call your pharmacy.   Labwork: None ordered  Testing/Procedures: None ordered  Follow-Up: Your physician wants you to follow-up in: 6 months with Dr. Elberta Fortis.  You will receive a reminder letter in the mail two months in advance. If you don't receive a letter, please call our office to schedule the follow-up appointment.   Thank you for choosing [facility]!!   Dory Horn, RN [phone]

## 2018-12-13 ENCOUNTER — Other Ambulatory Visit (HOSPITAL_COMMUNITY): Payer: Self-pay | Admitting: Cardiology

## 2018-12-17 ENCOUNTER — Encounter (HOSPITAL_COMMUNITY): Payer: Self-pay | Admitting: Cardiology

## 2018-12-17 ENCOUNTER — Ambulatory Visit (HOSPITAL_COMMUNITY)
Admission: RE | Admit: 2018-12-17 | Discharge: 2018-12-17 | Disposition: A | Discharge status: Home / Self Care | Source: Ambulatory Visit | Attending: Cardiology | Admitting: Cardiology

## 2018-12-17 VITALS — BP 112/78 | HR 71 | Wt 276.4 lb

## 2018-12-17 DIAGNOSIS — I480 Paroxysmal atrial fibrillation: Secondary | ICD-10-CM | POA: Diagnosis present

## 2018-12-17 DIAGNOSIS — I493 Ventricular premature depolarization: Secondary | ICD-10-CM | POA: Diagnosis not present

## 2018-12-17 DIAGNOSIS — Z7901 Long term (current) use of anticoagulants: Secondary | ICD-10-CM | POA: Diagnosis not present

## 2018-12-17 DIAGNOSIS — I5022 Chronic systolic (congestive) heart failure: Secondary | ICD-10-CM | POA: Diagnosis not present

## 2018-12-17 DIAGNOSIS — I340 Nonrheumatic mitral (valve) insufficiency: Secondary | ICD-10-CM | POA: Diagnosis not present

## 2018-12-17 DIAGNOSIS — Z87891 Personal history of nicotine dependence: Secondary | ICD-10-CM | POA: Insufficient documentation

## 2018-12-17 DIAGNOSIS — Z79899 Other long term (current) drug therapy: Secondary | ICD-10-CM | POA: Diagnosis not present

## 2018-12-17 DIAGNOSIS — Z8249 Family history of ischemic heart disease and other diseases of the circulatory system: Secondary | ICD-10-CM | POA: Diagnosis not present

## 2018-12-17 DIAGNOSIS — I2510 Atherosclerotic heart disease of native coronary artery without angina pectoris: Secondary | ICD-10-CM | POA: Insufficient documentation

## 2018-12-17 DIAGNOSIS — Z955 Presence of coronary angioplasty implant and graft: Secondary | ICD-10-CM | POA: Diagnosis not present

## 2018-12-17 MED ORDER — SACUBITRIL-VALSARTAN 97-103 MG PO TABS: 1.0000 | ORAL_TABLET | Freq: Two times a day (BID) | ORAL | 3 refills | Status: DC

## 2018-12-17 NOTE — Patient Instructions (Signed)
Please follow up lab work in 10 days.  INCREASE Entresto to 97/103mg  TWICE A DAY, this prescription has been sent to your pharmacy.  Your physician recommends that you schedule a follow-up appointment in: 3 months.

## 2018-12-18 NOTE — Progress Notes (Signed)
PCP: Raliegh Ip, DO Cardiology: Dr. Wyline Mood HF Cardiology: Dr. Shirlee Latch  62 y.o.with history of paroxysmal atrial fibrillation, CAD, and chronic systolic CHF was referred by Dr. Wyline Mood for CHF clinic evaluation.    Patient had no cardiac history prior to 2/18.  In 2/18, he developed the onset of exertional dyspnea. After steadily worsening dyspnea, he went to the ER at [facility] and was admitted.  Echo in 2/18 showed EF < 20% with severe MR.  He went back into NSR spontaneously.  He followed up with Dr. Wyline Mood and was set up for a cath in 3/18.  He was found to have a chronically occluded LCx and a long proximal to mid LAD stenosis.  No intervention was done.  He was noted to be back in rapid atrial fibrillation and was admitted.  He was diuresed and started on amiodarone, he converted back to NSR again spontaneously.   Underwent successful CTO procedure in 8/18 with DES to LCx, DES to pLAD, DES to dLAD.   In 10/18, he had atrial fibrillation ablation.  Now off amiodarone.   Cardiac MRI in 12/18 showed EF up to 44%.  Echo in 12/19 showed EF 40%.   He returns for followup of CHF.  Weight is up a lot over the winter.  He has been eating out frequently.  No significant dyspnea with his usual activities, no problems walking up a flight of stairs. No chest pain. Rare palpitations.  No BRBPR/melena.  No orthopnea/PND.   Labs (4/18): K 4.5, creatinine 1.0 Labs (5/18): K 4.2, creatinine 1.25, LFTs normal, TSH normal Labs (6/18): LDL 14, HDL 28 Labs (8/18): K 4.1 => 3.9, creatinine 1.0 => 1.14, LFTs normal, TSH normal Labs (10/18): hgb 13.8, K 4.4, creatinine 1.02 Labs (1/19): hgb 13.6, TSH normal, K 4.6, creatinine 1.05 Labs (02/11/2018): K 4.2 Creatinine 0.99 Labs (9/19): K 4.6, creatinine 0.99, hgb 13.6, LDL 31, HDL 28 Labs (2/20): K 4.5, creatinine 0.86  PMH: 1. Atrial fibrillation: Paroxysmal.  2. 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  - Echo (2/18) with mild LV dilation, EF < 20%, severe mitral regurgitation.  - RHC/LHC (3/18): long 80% proximal-mid LAD stenosis, total occlusion of the LCx with left to left collaterals, RCA ok. Mean RA 7, PA 32/10, mean PCWP 16, LVEDP 12, CI 1.54.  - Echo (6/18) with EF 30-35%, diffuse hypokinesis, normal RV size and systolic function, trivial MR.  - Echo (12/18): EF 35%, diffuse hypokinesis, normal RV size with mildly decreased systolic function.  - Cardiac MRI (12/18): EF 44%, small area of LGE in mid anteroseptal wall suggestive of prior infarction. - Echo (12/19): EF 40%, diffuse hypokinesis, normal RV size with mildly decreased systolic function.   3. CAD: LHC (3/18) with long 80% proximal-mid LAD stenosis, total occlusion of the LCx with left to left collaterals, RCA ok. - 8/18: CTO intervention with DES to LCx and DES x 2 to proximal and mid LAD.  4. Mitral regurgitation: Severe on 2/18 echo.  Suspect functional, as MR only trivial on 6/18 echo.  5. Sleep study (6/18) with no OSA but nocturnal hypoxemia.  He was instructed to start 2 liters oxygen at night but his insurance would not cover.   6. Allergic rhinitis 7. PVCs - Zio patch 12/19: 1.1% PVCs, 4 runs NSVT, longest 10 beats.   SH: Midwife and school custodian, lives in [place], quit smoking in 2011, married.  FH: Grandfather with MIs, mother with CABG and CHF.   Review of systems complete and found to be negative unless listed in HPI.    Current Outpatient Medications  Medication Sig Dispense Refill  . acetaminophen (TYLENOL) 325 MG tablet Take 650 mg by mouth every 6 (six) hours as needed (FOR HEADACHES/SINUS ISSUES.).    Marland Kitchen atorvastatin (LIPITOR) 80 MG tablet TAKE 1 TABLET DAILY AT 6 P.M. 90 tablet 2  . DULoxetine (CYMBALTA) 60 MG capsule Take 1 capsule (60 mg total) by mouth daily. (Needs to be seen before next refill) 90 capsule 0  . ELIQUIS 5 MG TABS  tablet TAKE 1 TABLET TWICE A DAY 180 tablet 4  . finasteride (PROSCAR) 5 MG tablet Take 1 tablet (5 mg total) by mouth daily. 90 tablet 0  . furosemide (LASIX) 20 MG tablet Take 1 tablet (20 mg total) by mouth daily as needed (for swelling or weight gain of 3 Lbs.). 30 tablet 3  . metoprolol succinate (TOPROL-XL) 50 MG 24 hr tablet Take 1 tablet (50 mg total) by mouth daily. Take with or immediately following a meal. (Patient taking differently: Take 50 mg by mouth 2 (two) times daily. Take with or immediately following a meal.) 90 tablet 3  . nitroGLYCERIN (NITROSTAT) 0.4 MG SL tablet Place 1 tablet (0.4 mg total) under the tongue every 5 (five) minutes as needed. 25 tablet 2  . Omega-3 Fatty Acids (FISH OIL) 1000 MG CAPS Take 1,000 mg by mouth every other day.     . spironolactone (ALDACTONE) 25 MG tablet TAKE 1 TABLET AT BEDTIME 90 tablet 4  . tamsulosin (FLOMAX) 0.4 MG CAPS capsule Take 1 capsule (0.4 mg total) by mouth daily. 90 capsule 0  . sacubitril-valsartan (ENTRESTO) 97-103 MG Take 1 tablet by mouth 2 (two) times daily. 60 tablet 3   No current facility-administered medications for this encounter.    BP 112/78   Pulse 71   Wt 125.4 kg (276 lb 6.4 oz)   SpO2 98%   BMI 33.64 kg/m    Wt Readings from Last 3 Encounters:  12/17/18 125.4 kg (276 lb 6.4 oz)  12/09/18 124.3 kg (274 lb)  10/29/18 121.4 kg (267 lb 9.6 oz)    General: NAD Neck: No JVD, no thyromegaly or thyroid nodule.  Lungs: Clear to auscultation bilaterally with normal respiratory effort. CV: Nondisplaced PMI.  Heart regular S1/S2, no S3/S4, no murmur.  No peripheral edema.  No carotid bruit.  Normal pedal pulses.  Abdomen: Soft, nontender, no hepatosplenomegaly, no distention.  Skin: Intact without lesions or rashes.  Neurologic: Alert and oriented x 3.  Psych: Normal affect. Extremities: No clubbing or cyanosis.  HEENT: Normal.   Assessment/Plan: 1. Atrial fibrillation: Paroxysmal.  Now s/p atrial  fibrillation ablation in 10/18.  NSR today.  He is now off amiodarone.  - Continue Eliquis for anticoagulation.   2. 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  Echo in 2/18 with EF < 20%.  EF 30-35% in 6/18 and 35% on repeat echo 12/18. Cardiac MRI in 12/18 to determine need for ICD showed EF 44%. Echo in 12/19 showed EF 40%. NYHA class II symptoms.  He is not volume overloaded on exam.  - Continue Toprol XL 50 mg bid.  - Increase Entresto to 97/103 bid, BMET 10 days.    - Continue spironolactone 25 daily.  - Continue to use Lasix only prn.  3. CAD: s/p DES  to LCx and LAD in 05/2017.  No chest pain.  - No ASA given stable CAD and use of Eliquis.  - Continue statin. Good lipids in 9/19.  4. Mitral regurgitation: Minimal on 12/19 echo.   5. PVCs: 1.1% on 12/19 Zio patch with 4 runs NSVT.  Toprol XL was increased, he is not feeling palpitations.   Followup in 3 months.   Marca Ancona 12/18/2018

## 2018-12-30 ENCOUNTER — Other Ambulatory Visit (HOSPITAL_COMMUNITY): Payer: Self-pay | Admitting: *Deleted

## 2018-12-30 ENCOUNTER — Other Ambulatory Visit

## 2018-12-30 ENCOUNTER — Other Ambulatory Visit: Payer: Self-pay

## 2018-12-30 DIAGNOSIS — I5022 Chronic systolic (congestive) heart failure: Secondary | ICD-10-CM

## 2018-12-30 NOTE — Progress Notes (Signed)
bmet  

## 2018-12-31 LAB — BASIC METABOLIC PANEL
BUN/Creatinine Ratio: 11 (ref 10–24)
BUN: 11 mg/dL (ref 8–27)
CO2: 22 mmol/L (ref 20–29)
CREATININE: 0.97 mg/dL (ref 0.76–1.27)
Calcium: 9.3 mg/dL (ref 8.6–10.2)
Chloride: 106 mmol/L (ref 96–106)
GFR calc Af Amer: 98 mL/min/{1.73_m2} (ref >59)
GFR calc non Af Amer: 84 mL/min/{1.73_m2} (ref >59)
Glucose: 59 mg/dL — ABNORMAL LOW (ref 65–99)
POTASSIUM: 4.5 mmol/L (ref 3.5–5.2)
Sodium: 142 mmol/L (ref 134–144)

## 2018-12-31 LAB — CBC
Hematocrit: 42.8 % (ref 37.5–51.0)
Hemoglobin: 14.4 g/dL (ref 13.0–17.7)
MCH: 29.4 pg (ref 26.6–33.0)
MCHC: 33.6 g/dL (ref 31.5–35.7)
MCV: 87 fL (ref 79–97)
Platelets: 338 10*3/uL (ref 150–450)
RBC: 4.9 x10E6/uL (ref 4.14–5.80)
RDW: 14.4 % (ref 11.6–15.4)
WBC: 7.4 10*3/uL (ref 3.4–10.8)

## 2019-02-11 ENCOUNTER — Encounter (HOSPITAL_COMMUNITY): Payer: Self-pay

## 2019-02-11 IMAGING — CT CT HEART MORPH/PULM VEIN W/ CM & W/O CA SCORE
3 of 5 series · 12 of 20 positions shown, 14 images · IV contrast (APPLIED)
Comparison: Chest CT 12/03/2016.

CLINICAL DATA: Pre Ablation

EXAM:
Cardiac CTA
MEDICATIONS:
None
TECHNIQUE: The patient was scanned on a Siemens Force [REDACTED]ice scanner. Gantry
rotation speed was 250 msecs. Collimation was .9mm. A 100 kV
prospective scan was triggered in the ascending thoracic aorta at
140 HU's. Average HR during the scan was 60 bpm. The 3D data set was
interpreted on a dedicated work station using MPR, MIP and VRT
modes. A total of 80cc of contrast was used.

[Series 6: best diast · axial · 0.46mm/px · z∈[+1119,+1242]mm · 5 of 463 slices shown, 7 images]
[im 78/463  vessel]
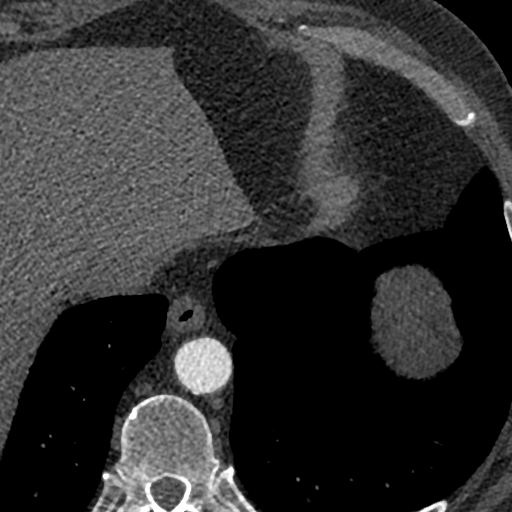
[im 78/463  lung]
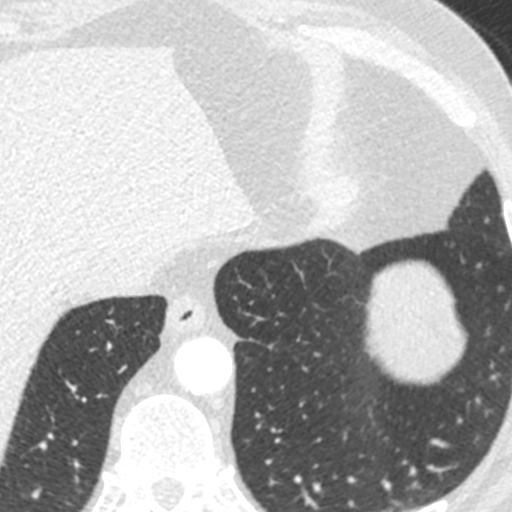
[im 155/463  vessel]
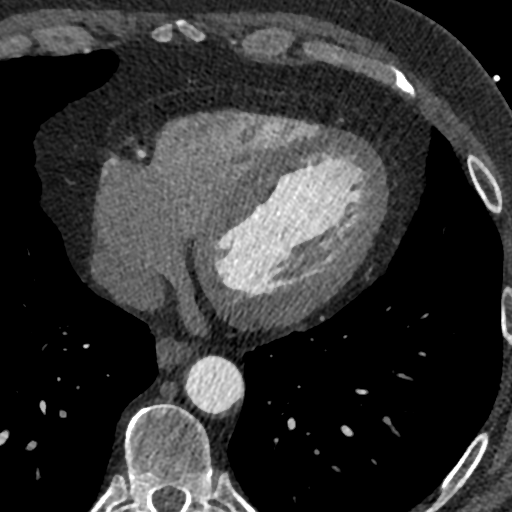
[im 232/463  vessel]
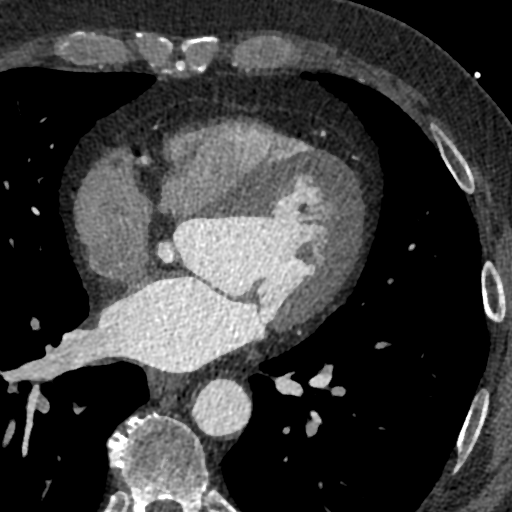
[im 309/463  vessel]
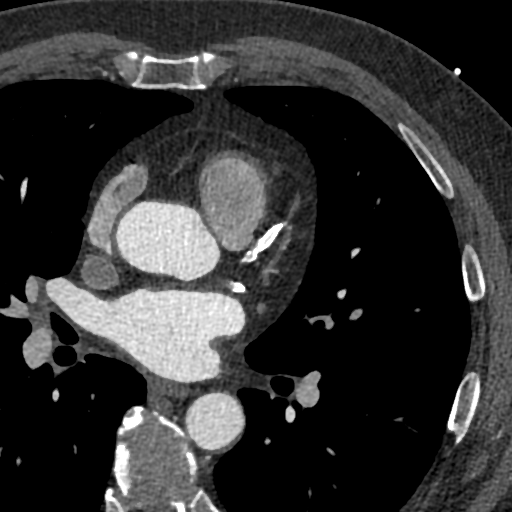
[im 386/463  vessel]
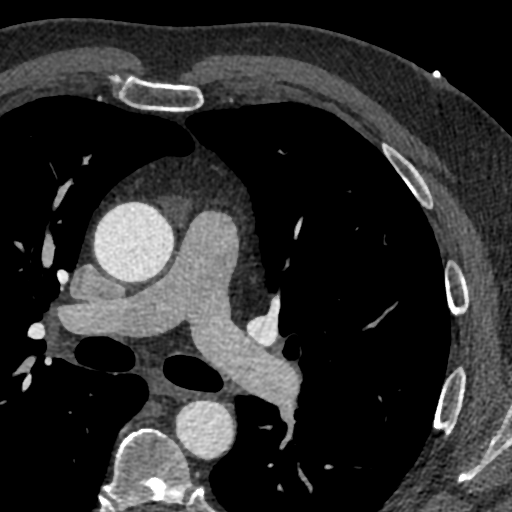
[im 386/463  lung]
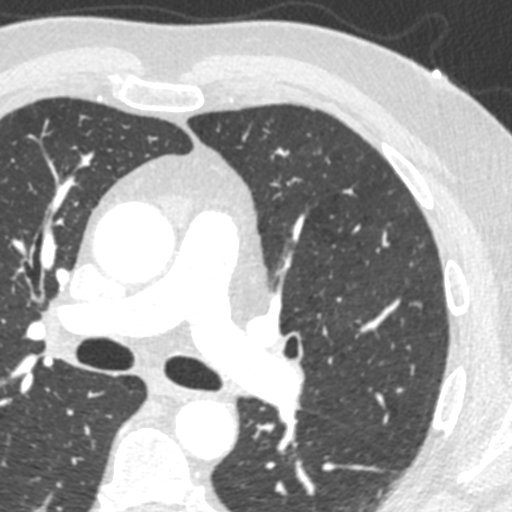

[Series 7: +300 ms · axial · 0.46mm/px · z∈[+1119,+1242]mm · 5 of 463 slices shown]
[im 78/463  vessel]
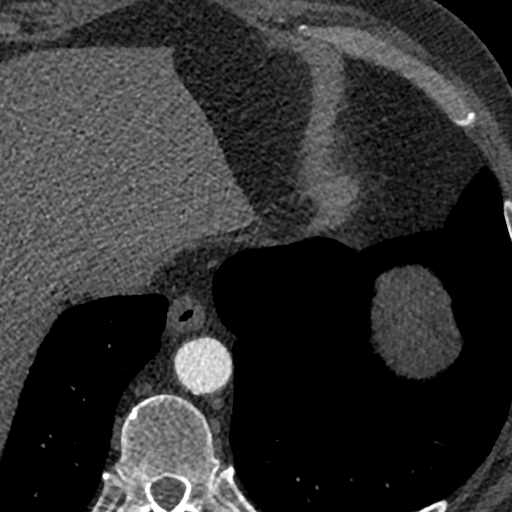
[im 155/463  vessel]
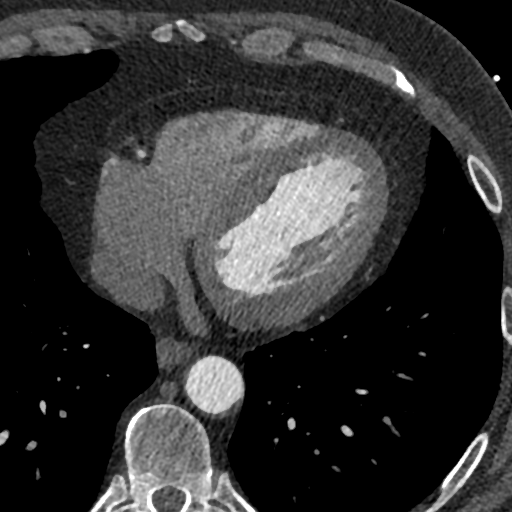
[im 232/463  vessel]
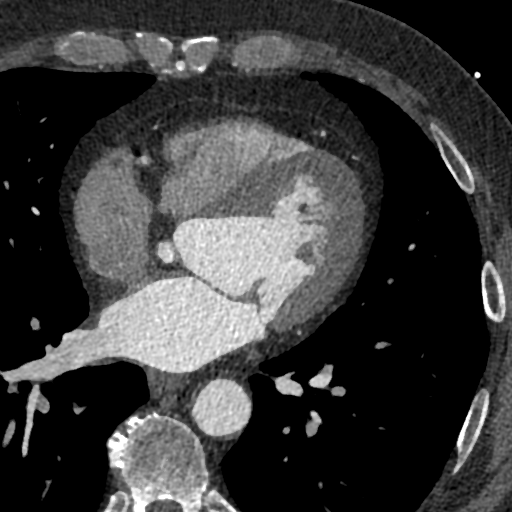
[im 309/463  vessel]
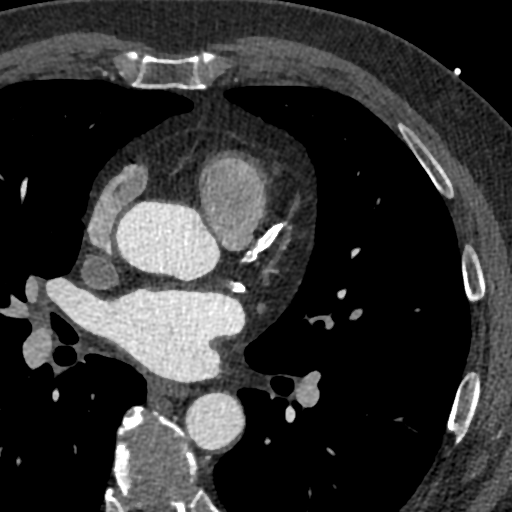
[im 386/463  vessel]
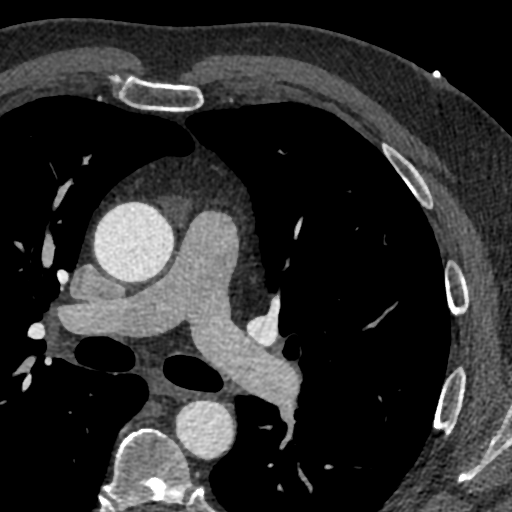

[Series 14: +300ms delay · axial · delayed · 0.45mm/px · z∈[+1176,+1225]mm · 2 of 298 slices shown]
[im 100/298  vessel]
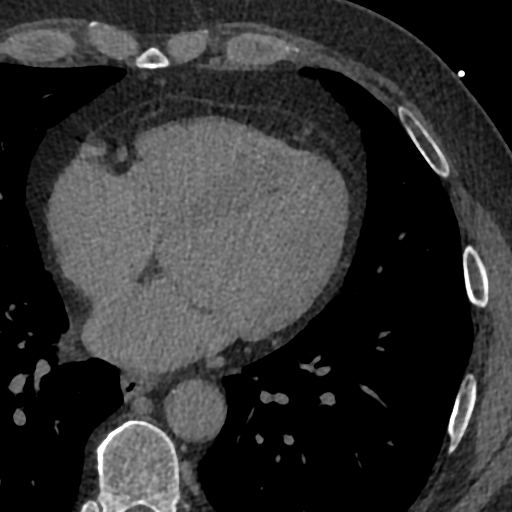
[im 199/298  vessel]
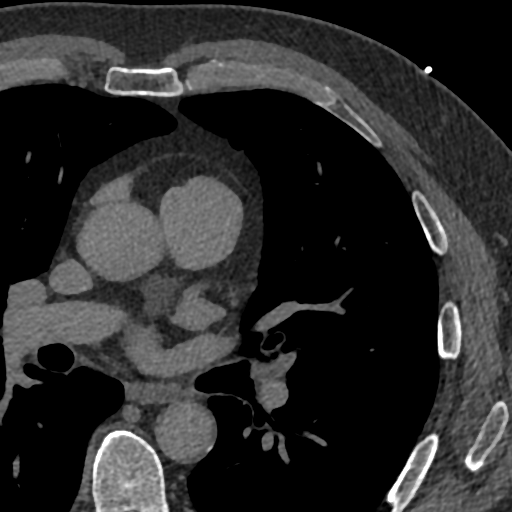

[12 of 20 positions shown; findings below may reference images not displayed]

FINDINGS: Non-cardiac: See separate report from [REDACTED]. No
significant findings on limited lung and soft tissue windows.

Calcium Score: Not done as patient has stents in mid LAD and mid
Circumflex

Normal aortic root 3.4 cm

Mild biatrial enlargement. No ANGEL CELIO thrombus. No ASD/PFO. No
pericardial effusion. Normal aortic root. Esophagus courses closest
to the LLPV ostium Normal pulmonary vein anatomy

Measurements below

LLPV:  Ostium 13.5 mm Area 1.73 cm2

LUPV:  Ostium 18 mm Area 1.7 cm2

RUPV:  Ostium 17 mm Area 2.8 cm2

RLPV:  Ostium 18 mm Area 3.3 cm2
IMPRESSION: 1) No ANGEL CELIO thrombus

2) Normal PV anatomy

3) Mild biatrial enlargement

4) No pericardial effusion

5) Esophagus courses closest to the LLPV ostium

6) Patent stents in mid LAD and mid Circumflex

7) Normal aortic root 3.4 cm

Chai Tiger

EXAM:
OVER-READ INTERPRETATION  CT CHEST

The following report is an over-read performed by radiologist Dr.
over-read does not include interpretation of cardiac or coronary
anatomy or pathology. The coronary calcium score/coronary CTA
interpretation by the cardiologist is attached.
FINDINGS: Aortic atherosclerosis. In the lateral segment of the right middle
lobe (axial image 23 of series 11) there is a 5 mm pulmonary nodule
which appears similar to prior study 12/03/2016, favored to be
benign. No other definite suspicious appearing pulmonary nodules or
masses are noted in the visualized portions of the thorax. Within
the visualized thorax there is no acute consolidative airspace
disease, no pleural effusions, no pneumothorax and no
lymphadenopathy. Visualized portions of the upper abdomen are
unremarkable. There are no aggressive appearing lytic or blastic
lesions noted in the visualized portions of the skeleton.
IMPRESSION: 1.  Aortic Atherosclerosis ([id number].R).
2. Nonspecific 5 mm pulmonary nodule in the lateral segment of the
right middle lobe, stable compared to 12/03/2016. No follow-up
needed if patient is low-risk. Non-contrast chest CT can be
considered in 12 months if patient is high-risk. This recommendation
follows the consensus statement: Guidelines for Management of
Incidental Pulmonary Nodules Detected on CT Images: From the

## 2019-02-11 MED ORDER — ATORVASTATIN CALCIUM 80 MG PO TABS: 80.0000 mg | ORAL_TABLET | Freq: Every day | ORAL | 2 refills | Status: DC

## 2019-02-18 ENCOUNTER — Encounter (HOSPITAL_COMMUNITY): Payer: Self-pay

## 2019-02-19 ENCOUNTER — Other Ambulatory Visit (HOSPITAL_COMMUNITY): Payer: Self-pay | Admitting: *Deleted

## 2019-02-19 MED ORDER — SACUBITRIL-VALSARTAN 97-103 MG PO TABS: 1.0000 | ORAL_TABLET | Freq: Two times a day (BID) | ORAL | 3 refills | Status: DC

## 2019-03-24 ENCOUNTER — Encounter (HOSPITAL_COMMUNITY): Admitting: Cardiology

## 2019-05-12 ENCOUNTER — Other Ambulatory Visit: Payer: Self-pay

## 2019-05-12 ENCOUNTER — Emergency Department (HOSPITAL_COMMUNITY)
Admission: EM | Admit: 2019-05-12 | Discharge: 2019-05-12 | Disposition: A | Discharge status: Home / Self Care | Attending: Emergency Medicine | Admitting: Emergency Medicine

## 2019-05-12 ENCOUNTER — Telehealth (HOSPITAL_COMMUNITY): Payer: Self-pay | Admitting: Cardiology

## 2019-05-12 ENCOUNTER — Encounter (HOSPITAL_COMMUNITY): Payer: Self-pay | Admitting: Emergency Medicine

## 2019-05-12 ENCOUNTER — Emergency Department (HOSPITAL_COMMUNITY)

## 2019-05-12 DIAGNOSIS — E860 Dehydration: Secondary | ICD-10-CM

## 2019-05-12 DIAGNOSIS — Z79899 Other long term (current) drug therapy: Secondary | ICD-10-CM | POA: Diagnosis not present

## 2019-05-12 DIAGNOSIS — I509 Heart failure, unspecified: Secondary | ICD-10-CM | POA: Diagnosis not present

## 2019-05-12 DIAGNOSIS — J45909 Unspecified asthma, uncomplicated: Secondary | ICD-10-CM | POA: Insufficient documentation

## 2019-05-12 DIAGNOSIS — R0602 Shortness of breath: Secondary | ICD-10-CM | POA: Insufficient documentation

## 2019-05-12 DIAGNOSIS — Z20828 Contact with and (suspected) exposure to other viral communicable diseases: Secondary | ICD-10-CM | POA: Diagnosis not present

## 2019-05-12 DIAGNOSIS — I2510 Atherosclerotic heart disease of native coronary artery without angina pectoris: Secondary | ICD-10-CM | POA: Insufficient documentation

## 2019-05-12 DIAGNOSIS — Z87891 Personal history of nicotine dependence: Secondary | ICD-10-CM | POA: Diagnosis not present

## 2019-05-12 LAB — BASIC METABOLIC PANEL
Anion gap: 8 (ref 5–15)
BUN: 21 mg/dL — ABNORMAL HIGH (ref 6–20)
CO2: 24 mmol/L (ref 22–32)
Calcium: 9.4 mg/dL (ref 8.9–10.3)
Chloride: 106 mmol/L (ref 98–111)
Creatinine, Ser: 1.33 mg/dL — ABNORMAL HIGH (ref 0.61–1.24)
GFR calc Af Amer: >60 mL/min (ref >60)
GFR calc non Af Amer: 58 mL/min — ABNORMAL LOW (ref >60)
Glucose, Bld: 115 mg/dL — ABNORMAL HIGH (ref 70–99)
Potassium: 4.4 mmol/L (ref 3.5–5.1)
Sodium: 138 mmol/L (ref 135–145)

## 2019-05-12 LAB — CBC WITH DIFFERENTIAL/PLATELET
Abs Immature Granulocytes: 0.02 10*3/uL (ref 0.00–0.07)
Basophils Absolute: 0 10*3/uL (ref 0.0–0.1)
Basophils Relative: 1 %
Eosinophils Absolute: 0.1 10*3/uL (ref 0.0–0.5)
Eosinophils Relative: 2 %
HCT: 43.2 % (ref 39.0–52.0)
Hemoglobin: 14 g/dL (ref 13.0–17.0)
Immature Granulocytes: 0 %
Lymphocytes Relative: 24 %
Lymphs Abs: 1.1 10*3/uL (ref 0.7–4.0)
MCH: 29.9 pg (ref 26.0–34.0)
MCHC: 32.4 g/dL (ref 30.0–36.0)
MCV: 92.3 fL (ref 80.0–100.0)
Monocytes Absolute: 0.4 10*3/uL (ref 0.1–1.0)
Monocytes Relative: 10 %
Neutro Abs: 2.8 10*3/uL (ref 1.7–7.7)
Neutrophils Relative %: 63 %
Platelets: 222 10*3/uL (ref 150–400)
RBC: 4.68 MIL/uL (ref 4.22–5.81)
RDW: 14.1 % (ref 11.5–15.5)
WBC: 4.5 10*3/uL (ref 4.0–10.5)
nRBC: 0 % (ref 0.0–0.2)

## 2019-05-12 LAB — HEPATIC FUNCTION PANEL
ALT: 80 U/L — ABNORMAL HIGH (ref 0–44)
AST: 49 U/L — ABNORMAL HIGH (ref 15–41)
Albumin: 4.2 g/dL (ref 3.5–5.0)
Alkaline Phosphatase: 86 U/L (ref 38–126)
Bilirubin, Direct: 0.3 mg/dL — ABNORMAL HIGH (ref 0.0–0.2)
Indirect Bilirubin: 2.3 mg/dL — ABNORMAL HIGH (ref 0.3–0.9)
Total Bilirubin: 2.6 mg/dL — ABNORMAL HIGH (ref 0.3–1.2)
Total Protein: 6.6 g/dL (ref 6.5–8.1)

## 2019-05-12 LAB — TROPONIN I (HIGH SENSITIVITY)
Troponin I (High Sensitivity): 2 ng/L (ref <18)
Troponin I (High Sensitivity): <2 ng/L (ref <18)

## 2019-05-12 LAB — BRAIN NATRIURETIC PEPTIDE: B Natriuretic Peptide: 12 pg/mL (ref 0.0–100.0)

## 2019-05-12 LAB — SARS CORONAVIRUS 2 BY RT PCR (HOSPITAL ORDER, PERFORMED IN ~~LOC~~ HOSPITAL LAB): SARS Coronavirus 2: NEGATIVE

## 2019-05-12 LAB — LACTIC ACID, PLASMA: Lactic Acid, Venous: 1.5 mmol/L (ref 0.5–1.9)

## 2019-05-12 LAB — MAGNESIUM: Magnesium: 2 mg/dL (ref 1.7–2.4)

## 2019-05-12 LAB — D-DIMER, QUANTITATIVE: D-Dimer, Quant: 0.33 ug/mL-FEU (ref 0.00–0.50)

## 2019-05-12 MED ORDER — SODIUM CHLORIDE 0.9 % IV BOLUS
1000.0000 mL | Freq: Once | INTRAVENOUS | Status: AC
  Administered 2019-05-12: 1000 mL via INTRAVENOUS

## 2019-05-12 NOTE — Discharge Instructions (Addendum)
Hold metoprolol tomorrow. Restart on Wednesday at once daily dosing.

## 2019-05-12 NOTE — ED Triage Notes (Signed)
Pt reports generalized weakness with fatigue, hypotension, and shortness of breath. Has been taking metoprolol and MD changed it to 50mg , pt thought it was supposed to be twice daily, but was once daily. Has been on double the betablocker he should be for 1 month.
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
## 2019-05-12 NOTE — Telephone Encounter (Signed)
Patients wife left voicemail  @ 1256 reports b/p 92/53 HR 107 o2sats 94% on room air  By the time we returned call to patient @ 1453, wife took him the the ER for further evaluation of hypotension, night sweats, tremors, low temperature, and patient had been taking metoprolol incorrectly

## 2019-05-12 NOTE — ED Notes (Signed)
Pt ambulated without difficulty

## 2019-05-17 LAB — CULTURE, BLOOD (ROUTINE X 2)
Culture: NO GROWTH
Culture: NO GROWTH
Special Requests: ADEQUATE
Special Requests: ADEQUATE

## 2019-10-26 ENCOUNTER — Other Ambulatory Visit (HOSPITAL_COMMUNITY): Payer: Self-pay | Admitting: Internal Medicine

## 2019-11-05 ENCOUNTER — Other Ambulatory Visit (HOSPITAL_COMMUNITY): Payer: Self-pay | Admitting: Cardiology

## 2019-11-05 DIAGNOSIS — I5022 Chronic systolic (congestive) heart failure: Secondary | ICD-10-CM

## 2019-11-07 ENCOUNTER — Other Ambulatory Visit: Payer: Self-pay

## 2019-11-07 ENCOUNTER — Encounter (HOSPITAL_COMMUNITY): Payer: Self-pay | Admitting: Cardiology

## 2019-11-07 ENCOUNTER — Ambulatory Visit (HOSPITAL_BASED_OUTPATIENT_CLINIC_OR_DEPARTMENT_OTHER)
Admission: RE | Admit: 2019-11-07 | Discharge: 2019-11-07 | Disposition: A | Discharge status: Home / Self Care | Source: Ambulatory Visit | Attending: Cardiology | Admitting: Cardiology

## 2019-11-07 ENCOUNTER — Ambulatory Visit (HOSPITAL_COMMUNITY)
Admission: RE | Admit: 2019-11-07 | Discharge: 2019-11-07 | Disposition: A | Discharge status: Home / Self Care | Source: Ambulatory Visit | Attending: Cardiology | Admitting: Cardiology

## 2019-11-07 VITALS — BP 119/60 | HR 65 | Wt 280.2 lb

## 2019-11-07 DIAGNOSIS — Z955 Presence of coronary angioplasty implant and graft: Secondary | ICD-10-CM | POA: Insufficient documentation

## 2019-11-07 DIAGNOSIS — I472 Ventricular tachycardia: Secondary | ICD-10-CM | POA: Insufficient documentation

## 2019-11-07 DIAGNOSIS — I2510 Atherosclerotic heart disease of native coronary artery without angina pectoris: Secondary | ICD-10-CM | POA: Diagnosis not present

## 2019-11-07 DIAGNOSIS — I5022 Chronic systolic (congestive) heart failure: Secondary | ICD-10-CM | POA: Diagnosis not present

## 2019-11-07 DIAGNOSIS — Z7901 Long term (current) use of anticoagulants: Secondary | ICD-10-CM | POA: Insufficient documentation

## 2019-11-07 DIAGNOSIS — I480 Paroxysmal atrial fibrillation: Secondary | ICD-10-CM | POA: Diagnosis not present

## 2019-11-07 DIAGNOSIS — I493 Ventricular premature depolarization: Secondary | ICD-10-CM | POA: Insufficient documentation

## 2019-11-07 DIAGNOSIS — I340 Nonrheumatic mitral (valve) insufficiency: Secondary | ICD-10-CM | POA: Diagnosis not present

## 2019-11-07 DIAGNOSIS — Z79899 Other long term (current) drug therapy: Secondary | ICD-10-CM | POA: Diagnosis not present

## 2019-11-07 DIAGNOSIS — Z8249 Family history of ischemic heart disease and other diseases of the circulatory system: Secondary | ICD-10-CM | POA: Insufficient documentation

## 2019-11-07 DIAGNOSIS — Z87891 Personal history of nicotine dependence: Secondary | ICD-10-CM | POA: Diagnosis not present

## 2019-11-07 LAB — CBC
HCT: 45.5 % (ref 39.0–52.0)
Hemoglobin: 14.8 g/dL (ref 13.0–17.0)
MCH: 29 pg (ref 26.0–34.0)
MCHC: 32.5 g/dL (ref 30.0–36.0)
MCV: 89 fL (ref 80.0–100.0)
Platelets: 294 10*3/uL (ref 150–400)
RBC: 5.11 MIL/uL (ref 4.22–5.81)
RDW: 13.9 % (ref 11.5–15.5)
WBC: 5.1 10*3/uL (ref 4.0–10.5)
nRBC: 0 % (ref 0.0–0.2)

## 2019-11-07 LAB — LIPID PANEL
Cholesterol: 83 mg/dL (ref 0–200)
HDL: 26 mg/dL — ABNORMAL LOW (ref >40)
LDL Cholesterol: 40 mg/dL (ref 0–99)
Total CHOL/HDL Ratio: 3.2 RATIO
Triglycerides: 85 mg/dL (ref <150)
VLDL: 17 mg/dL (ref 0–40)

## 2019-11-07 LAB — BASIC METABOLIC PANEL
Anion gap: 9 (ref 5–15)
BUN: 15 mg/dL (ref 8–23)
CO2: 25 mmol/L (ref 22–32)
Calcium: 9.3 mg/dL (ref 8.9–10.3)
Chloride: 107 mmol/L (ref 98–111)
Creatinine, Ser: 1.04 mg/dL (ref 0.61–1.24)
GFR calc Af Amer: >60 mL/min (ref >60)
GFR calc non Af Amer: >60 mL/min (ref >60)
Glucose, Bld: 109 mg/dL — ABNORMAL HIGH (ref 70–99)
Potassium: 4.4 mmol/L (ref 3.5–5.1)
Sodium: 141 mmol/L (ref 135–145)

## 2019-11-07 MED ORDER — METOPROLOL SUCCINATE ER 50 MG PO TB24: 50.0000 mg | ORAL_TABLET | Freq: Two times a day (BID) | ORAL | 3 refills | Status: DC

## 2019-11-07 NOTE — Progress Notes (Signed)
Echocardiogram 2D Echocardiogram has been performed.  Kevin Oconnell Kevin Oconnell 11/07/2019, 9:30 AM

## 2019-11-07 NOTE — Patient Instructions (Addendum)
INCREASE Toprol XL to 50mg  (1 tab) twice a day   Labs today We will only contact you if something comes back abnormal or we need to make some changes. Otherwise no news is good news!    Your physician recommends that you schedule a follow-up appointment in: 3 weeks with the pharmacist Lauren and 3 months with Dr .   Parking Garage Codes: February 6008 April 5009   Please call office at [phone] option [phone] if you have any questions or concerns.    At the [facility], you and your health needs are our priority. As part of our continuing mission to provide you with exceptional heart care, we have created designated Provider Care Teams. These Care Teams include your primary Cardiologist (physician) and Advanced Practice Providers (APPs- Physician Assistants and Nurse Practitioners) who all work together to provide you with the care you need, when you need it.   You may see any of the following providers on your designated Care Team at your next follow up: [phone] Dr Marland Kitchen . Dr Arvilla Meres . Marca Ancona, NP . Tonye Becket, PA . Robbie Lis, PharmD   Please be sure to bring in all your medications bottles to every appointment.

## 2019-11-08 NOTE — Progress Notes (Signed)
PCP: Raliegh Ip, DO Cardiology: Dr. Wyline Mood HF Cardiology: Dr. Shirlee Latch  64 y.o.with history of paroxysmal atrial fibrillation, CAD, and chronic systolic CHF was referred by Dr. Wyline Mood for CHF clinic evaluation.    Patient had no cardiac history prior to 2/18.  In 2/18, he developed the onset of exertional dyspnea. After steadily worsening dyspnea, he went to the ER at [facility] and was admitted.  Echo in 2/18 showed EF < 20% with severe MR.  He went back into NSR spontaneously.  He followed up with Dr. Wyline Mood and was set up for a cath in 3/18.  He was found to have a chronically occluded LCx and a long proximal to mid LAD stenosis.  No intervention was done.  He was noted to be back in rapid atrial fibrillation and was admitted.  He was diuresed and started on amiodarone, he converted back to NSR again spontaneously.    He underwent successful CTO procedure in 8/18 with DES to LCx, DES to pLAD, DES to dLAD.    In 10/18, he had atrial fibrillation ablation.  Now off amiodarone.   Cardiac MRI in 12/18 showed EF up to 44%.  Echo in 12/19 showed EF 40%. Echo was done today and reviewed, EF 35-40% with diffuse hypokinesis and normal RV.   He returns for followup of CHF.  He is working full time as a Data processing manager.  He does not get short of breath with most activities.  He does notice shortness of breath lifting bags of 50 lb chicken feed.  No orthopnea/PND.  No chest pain.  No palpitations.   ECG (1/21): NSR, low voltage   Labs (4/18): K 4.5, creatinine 1.0 Labs (5/18): K 4.2, creatinine 1.25, LFTs normal, TSH normal Labs (6/18): LDL 14, HDL 28 Labs (8/18): K 4.1 => 3.9, creatinine 1.0 => 1.14, LFTs normal, TSH normal Labs (10/18): hgb 13.8, K 4.4, creatinine 1.02 Labs (1/19): hgb 13.6, TSH normal, K 4.6, creatinine 1.05 Labs (02/11/2018): K 4.2 Creatinine 0.99 Labs (9/19): K 4.6, creatinine 0.99, hgb 13.6, LDL 31, HDL 28 Labs (2/20): K 4.5, creatinine 0.86 Labs (7/20): K 4.4,  creatinine 1.33  PMH: 1. Atrial fibrillation: Paroxysmal.  2. 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  - Echo (2/18) with mild LV dilation, EF < 20%, severe mitral regurgitation.  - RHC/LHC (3/18): long 80% proximal-mid LAD stenosis, total occlusion of the LCx with left to left collaterals, RCA ok. Mean RA 7, PA 32/10, mean PCWP 16, LVEDP 12, CI 1.54.  - Echo (6/18) with EF 30-35%, diffuse hypokinesis, normal RV size and systolic function, trivial MR.  - Echo (12/18): EF 35%, diffuse hypokinesis, normal RV size with mildly decreased systolic function.  - Cardiac MRI (12/18): EF 44%, small area of LGE in mid anteroseptal wall suggestive of prior infarction. - Echo (12/19): EF 40%, diffuse hypokinesis, normal RV size with mildly decreased systolic function.    - Echo (5/83): EF 35-40%, diffuse hypokinesis, normal RV size and systolic function.  3. CAD: LHC (3/18) with long 80% proximal-mid LAD stenosis, total occlusion of the LCx with left to left collaterals, RCA ok. - 8/18: CTO intervention with DES to LCx and DES x 2 to proximal and mid LAD.  4. Mitral regurgitation: Severe on 2/18 echo.  Suspect functional, as MR only trivial on 6/18 echo.  5. Sleep study (6/18) with no OSA but nocturnal hypoxemia.  He was instructed to start  2 liters oxygen at night but his insurance would not cover.   6. Allergic rhinitis 7. PVCs - Zio patch 12/19: 1.1% PVCs, 4 runs NSVT, longest 10 beats.   SH: Midwife and school custodian, lives in [place], quit smoking in 2011, married.   FH: Grandfather with MIs, mother with CABG and CHF.   Review of systems complete and found to be negative unless listed in HPI.    Current Outpatient Medications  Medication Sig Dispense Refill  . acetaminophen (TYLENOL) 325 MG tablet Take 650 mg by mouth every 6 (six) hours as needed (FOR HEADACHES/SINUS ISSUES.).    Marland Kitchen  atorvastatin (LIPITOR) 80 MG tablet TAKE 1 TABLET DAILY AT 6 P.M. 90 tablet 3  . ELIQUIS 5 MG TABS tablet TAKE 1 TABLET TWICE A DAY 180 tablet 4  . finasteride (PROSCAR) 5 MG tablet Take 1 tablet (5 mg total) by mouth daily. 90 tablet 0  . furosemide (LASIX) 20 MG tablet Take 1 tablet (20 mg total) by mouth daily as needed (for swelling or weight gain of 3 Lbs.). 30 tablet 3  . metoprolol succinate (TOPROL-XL) 50 MG 24 hr tablet Take 1 tablet (50 mg total) by mouth 2 (two) times daily. Take with or immediately following a meal. 180 tablet 3  . nitroGLYCERIN (NITROSTAT) 0.4 MG SL tablet Place 1 tablet (0.4 mg total) under the tongue every 5 (five) minutes as needed. 25 tablet 2  . Omega-3 Fatty Acids (FISH OIL) 1000 MG CAPS Take 1,000 mg by mouth every other day.     . sacubitril-valsartan (ENTRESTO) 97-103 MG Take 1 tablet by mouth 2 (two) times daily. 180 tablet 3  . spironolactone (ALDACTONE) 25 MG tablet TAKE 1 TABLET AT BEDTIME 90 tablet 4   No current facility-administered medications for this encounter.   BP 119/60   Pulse 65   Wt 127.1 kg (280 lb 3.2 oz)   SpO2 98%   BMI 34.11 kg/m    Wt Readings from Last 3 Encounters:  11/07/19 127.1 kg (280 lb 3.2 oz)  05/12/19 124.3 kg (274 lb)  12/17/18 125.4 kg (276 lb 6.4 oz)    General: NAD Neck: No JVD, no thyromegaly or thyroid nodule.  Lungs: Clear to auscultation bilaterally with normal respiratory effort. CV: Nondisplaced PMI.  Heart regular S1/S2, no S3/S4, no murmur.  No peripheral edema.  No carotid bruit.  Normal pedal pulses.  Abdomen: Soft, nontender, no hepatosplenomegaly, no distention.  Skin: Intact without lesions or rashes.  Neurologic: Alert and oriented x 3.  Psych: Normal affect. Extremities: No clubbing or cyanosis.  HEENT: Normal.   Assessment/Plan: 1. Atrial fibrillation: Paroxysmal.  Now s/p atrial fibrillation ablation in 10/18.  NSR today.  He is now off amiodarone.  - Continue Eliquis for anticoagulation.   CBC today.   2. 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  Echo in 2/18 with EF < 20%.  EF 30-35% in 6/18 and 35% on repeat echo 12/18. Cardiac MRI in 12/18 to determine need for ICD showed EF 44%. Echo was done today and reviewed, EF 35-40%. NYHA class II symptoms.  He is not volume overloaded on exam.  - Increase Toprol XL to 50 mg bid (taking qd).   - Continue Entresto 97/103 bid, check BMET today.    - Continue spironolactone 25 daily.  - Continue to use Lasix only prn.  3. CAD: s/p DES to LCx and LAD in 05/2017.  No chest pain.  - No ASA given stable CAD and use of Eliquis.  - Continue statin. Check lipids today.  4. Mitral regurgitation: Minimal on 1/21 echo.    5. PVCs: 1.1% on 12/19 Zio patch with 4 runs NSVT.  Toprol XL was increased, he is not feeling palpitations.   Followup in 3 wks with HF pharmacist, aim to start dapagliflozin at that time.  I will see him back in 3 months.   Loralie Champagne 11/08/2019

## 2019-11-19 NOTE — Progress Notes (Signed)
PCP: Raliegh Ip, DO Cardiology: Dr. Wyline Mood HF Cardiology: Dr. Shirlee Latch  HPI:  64 y.o.with history of paroxysmal atrial fibrillation, CAD, and chronic systolic CHF was referred by Dr. Wyline Mood for CHF clinic evaluation.    Patient had no cardiac history prior to 2/18.  In 2/18, he developed the onset of exertional dyspnea. After steadily worsening dyspnea, he went to the ER at [facility] and was admitted.  Echo in 2/18 showed EF < 20% with severe MR.  He went back into NSR spontaneously.  He followed up with Dr. Wyline Mood and was set up for a cath in 3/18.  He was found to have a chronically occluded LCx and a long proximal to mid LAD stenosis.  No intervention was done.  He was noted to be back in rapid atrial fibrillation and was admitted.  He was diuresed and started on amiodarone, he converted back to NSR again spontaneously.    He underwent successful CTO procedure in 8/18 with DES to LCx, DES to pLAD, DES to dLAD.    In 10/18, he had atrial fibrillation ablation.  Now off amiodarone.   Cardiac MRI in 12/18 showed EF up to 44%.  Echo in 12/19 showed EF 40%. Echo was done today and reviewed, EF 35-40% with diffuse hypokinesis and normal RV.   He recently returned to HF clinic for follow up on 11/07/19 with Dr. Shirlee Latch. He was working full time as a Data processing manager.  He does not get short of breath with most activities.  He does notice shortness of breath lifting bags of 50 lb chicken feed.  No orthopnea/PND.  No chest pain.  No palpitations.  Today he returns to HF clinic for pharmacist medication titration. At last visit with MD, metoprolol succinate was increased to 50 mg BID. Overall he is feeling very well today. No dizziness, lightheadedness, chest pain or palpitations. No increased fatigue. His breathing has been good. Only gets SOB when lifting heavy objects. He weighs himself daily at home and his weight has been stable at 273 lbs. He only uses furosemide PRN - takes ~1x/month. No  LEE, PND or orthopnea. His appetite is good and he follows a low sodium diet.    . Shortness of breath/dyspnea on exertion? no  . Orthopnea/PND? no . Edema? no . Lightheadedness/dizziness? no . Daily weights at home? yes . Blood pressure/heart rate monitoring at home? no . Following low-sodium/fluid-restricted diet? yes  HF Medications: Metoprolol succinate 50 mg BID Entresto 97/103 mg BID Spironolactone 25 mg daily Furosemide 20 mg PRN  Has the patient been experiencing any side effects to the medications prescribed?  no  Does the patient have any problems obtaining medications due to transportation or finances?   No - has [employer]. Fills through [facility] mail order pharmacy.   Understanding of regimen: good Understanding of indications: good Potential of compliance: good Patient understands to avoid NSAIDs. Patient understands to avoid decongestants.    Pertinent Lab Values (11/07/19): Marland Kitchen Serum creatinine 1.04, BUN 15, Potassium 4.4, Sodium 141  Vital Signs: . Weight: 280.4 lbs (last clinic weight: 280.2 lbs) . Blood pressure: 110/72 . Heart rate: 59   Assessment: 1. Atrial fibrillation: Paroxysmal.  Now s/p atrial fibrillation ablation in 10/18.   He is now off amiodarone.  - Continue Eliquis for anticoagulation.    2. 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  Echo in 2/18 with EF < 20%.  EF 30-35% in 6/18  and 35% on repeat echo 12/18. Cardiac MRI in 12/18 to determine need for ICD showed EF 44%. Echo was done today and reviewed, EF 35-40%.  -NYHA class II symptoms.  He is not volume overloaded on exam. - Vitals: BP 110/72, HR 59 - Continue to use Lasix only prn.   - Continue metoprolol succinate 50 mg BID.   - Continue Entresto 97/103 mg BID.  - Continue spironolactone 25 daily.  - Start empagliflozin (Jardiance) 10 mg daily (preffered SGLT2i by insurance). [facility] BMET  in 4 weeks at [facility].  3. CAD: s/p DES to LCx and LAD in 05/2017.  No chest pain.  - No ASA given stable CAD and use of Eliquis.  -Lipid panel 11/07/19: TC 83, LDL 40, HDL 26.  Continue atorvastatin 80 mg daily.   4. Mitral regurgitation: Minimal on 1/21 echo.    5. PVCs: 1.1% on 12/19 Zio patch with 4 runs NSVT.   - Continue metoprolol succinate 50 mg BID.   Plan: 1) Medication changes: Based on clinical presentation, vital signs and recent labs will start empagliflozin 10 mg daily.  2) Follow-up: Repeat BMET in 4 weeks. Return to HF Clinic in 2 months with Dr. Aundra Dubin.    Audry Riles, PharmD, BCPS, BCCP, CPP Heart Failure Clinic Pharmacist [phone]

## 2019-11-24 ENCOUNTER — Ambulatory Visit (HOSPITAL_COMMUNITY)
Admission: RE | Admit: 2019-11-24 | Discharge: 2019-11-24 | Disposition: A | Discharge status: Home / Self Care | Source: Ambulatory Visit | Attending: Internal Medicine | Admitting: Internal Medicine

## 2019-11-24 ENCOUNTER — Other Ambulatory Visit (HOSPITAL_COMMUNITY)

## 2019-11-24 ENCOUNTER — Encounter (HOSPITAL_COMMUNITY): Payer: Self-pay

## 2019-11-24 ENCOUNTER — Other Ambulatory Visit: Payer: Self-pay

## 2019-11-24 VITALS — BP 110/72 | HR 59 | Wt 280.4 lb

## 2019-11-24 DIAGNOSIS — I5022 Chronic systolic (congestive) heart failure: Secondary | ICD-10-CM

## 2019-11-24 DIAGNOSIS — I480 Paroxysmal atrial fibrillation: Secondary | ICD-10-CM | POA: Insufficient documentation

## 2019-11-24 DIAGNOSIS — Z79899 Other long term (current) drug therapy: Secondary | ICD-10-CM | POA: Insufficient documentation

## 2019-11-24 DIAGNOSIS — I340 Nonrheumatic mitral (valve) insufficiency: Secondary | ICD-10-CM | POA: Insufficient documentation

## 2019-11-24 DIAGNOSIS — I429 Cardiomyopathy, unspecified: Secondary | ICD-10-CM | POA: Diagnosis not present

## 2019-11-24 DIAGNOSIS — I2510 Atherosclerotic heart disease of native coronary artery without angina pectoris: Secondary | ICD-10-CM | POA: Diagnosis not present

## 2019-11-24 MED ORDER — EMPAGLIFLOZIN 10 MG PO TABS: 10.0000 mg | ORAL_TABLET | Freq: Every day | ORAL | 3 refills | Status: DC

## 2019-11-24 NOTE — Addendum Note (Signed)
Encounter addended by: Marisa Hua, RN on: 11/24/2019 11:32 AM  Actions taken: Order list changed, Diagnosis association updated

## 2019-11-24 NOTE — Patient Instructions (Addendum)
It was a pleasure seeing you today!  MEDICATIONS: -We are changing your medications today -Start Jardiance (empagliflozin) 10 mg (1 tablet) daily -Call if you have questions about your medications.  NEXT APPOINTMENT: -Get labs rechecked the week of 12/29/19 at [facility] Return to clinic on 4/21 with Dr. Shirlee Latch.  In general, to take care of your heart failure: -Limit your fluid intake to 2 Liters (half-gallon) per day.   -Limit your salt intake to ideally 2-3 grams (2000-3000 mg) per day. -Weigh yourself daily and record, and bring that "weight diary" to your next appointment.  (Weight gain of 2-3 pounds in 1 day typically means fluid weight.) -The medications for your heart are to help your heart and help you live longer.   -Please contact us before stopping any of your heart medications.  Call the clinic at [phone] with questions or to reschedule future appointments.

## 2019-11-27 ENCOUNTER — Telehealth (HOSPITAL_COMMUNITY): Payer: Self-pay | Admitting: Pharmacist

## 2019-11-27 NOTE — Telephone Encounter (Signed)
Advanced Heart Failure Patient Advocate Encounter  Prior Authorization for [place] [employer] has been approved.    Effective dates: 11/27/19 through 10/15/2098  Karle Plumber, PharmD, BCPS, BCCP, CPP Heart Failure Clinic Pharmacist [phone]

## 2019-11-27 NOTE — Telephone Encounter (Signed)
Patient Advocate Encounter   Received notification from [employer] that prior authorization for [place] Pepper is required.   PA submitted via Fax Phone: [phone] Status is pending   Will continue to follow.  Karle Plumber, PharmD, BCPS, BCCP, CPP Heart Failure Clinic Pharmacist [phone]

## 2019-12-02 ENCOUNTER — Other Ambulatory Visit (HOSPITAL_COMMUNITY)

## 2020-01-04 ENCOUNTER — Other Ambulatory Visit (HOSPITAL_COMMUNITY): Payer: Self-pay | Admitting: Cardiology

## 2020-02-03 ENCOUNTER — Encounter (HOSPITAL_COMMUNITY): Admitting: Cardiology

## 2020-02-04 ENCOUNTER — Other Ambulatory Visit: Payer: Self-pay

## 2020-02-04 ENCOUNTER — Ambulatory Visit (HOSPITAL_COMMUNITY)
Admission: RE | Admit: 2020-02-04 | Discharge: 2020-02-04 | Disposition: A | Discharge status: Home / Self Care | Source: Ambulatory Visit | Attending: Cardiology | Admitting: Cardiology

## 2020-02-04 ENCOUNTER — Encounter (HOSPITAL_COMMUNITY): Payer: Self-pay | Admitting: Cardiology

## 2020-02-04 VITALS — BP 120/70 | HR 65 | Wt 280.8 lb

## 2020-02-04 DIAGNOSIS — Z955 Presence of coronary angioplasty implant and graft: Secondary | ICD-10-CM | POA: Diagnosis not present

## 2020-02-04 DIAGNOSIS — I2510 Atherosclerotic heart disease of native coronary artery without angina pectoris: Secondary | ICD-10-CM | POA: Diagnosis not present

## 2020-02-04 DIAGNOSIS — Z87891 Personal history of nicotine dependence: Secondary | ICD-10-CM | POA: Insufficient documentation

## 2020-02-04 DIAGNOSIS — Z7901 Long term (current) use of anticoagulants: Secondary | ICD-10-CM | POA: Insufficient documentation

## 2020-02-04 DIAGNOSIS — Z79899 Other long term (current) drug therapy: Secondary | ICD-10-CM | POA: Insufficient documentation

## 2020-02-04 DIAGNOSIS — I340 Nonrheumatic mitral (valve) insufficiency: Secondary | ICD-10-CM | POA: Insufficient documentation

## 2020-02-04 DIAGNOSIS — Z7984 Long term (current) use of oral hypoglycemic drugs: Secondary | ICD-10-CM | POA: Diagnosis not present

## 2020-02-04 DIAGNOSIS — G4734 Idiopathic sleep related nonobstructive alveolar hypoventilation: Secondary | ICD-10-CM | POA: Diagnosis not present

## 2020-02-04 DIAGNOSIS — I493 Ventricular premature depolarization: Secondary | ICD-10-CM | POA: Diagnosis not present

## 2020-02-04 DIAGNOSIS — Z8249 Family history of ischemic heart disease and other diseases of the circulatory system: Secondary | ICD-10-CM | POA: Insufficient documentation

## 2020-02-04 DIAGNOSIS — I5022 Chronic systolic (congestive) heart failure: Secondary | ICD-10-CM | POA: Insufficient documentation

## 2020-02-04 DIAGNOSIS — I480 Paroxysmal atrial fibrillation: Secondary | ICD-10-CM | POA: Insufficient documentation

## 2020-02-04 LAB — BASIC METABOLIC PANEL
Anion gap: 6 (ref 5–15)
BUN: 14 mg/dL (ref 8–23)
CO2: 26 mmol/L (ref 22–32)
Calcium: 8.9 mg/dL (ref 8.9–10.3)
Chloride: 108 mmol/L (ref 98–111)
Creatinine, Ser: 0.99 mg/dL (ref 0.61–1.24)
GFR calc Af Amer: >60 mL/min (ref >60)
GFR calc non Af Amer: >60 mL/min (ref >60)
Glucose, Bld: 93 mg/dL (ref 70–99)
Potassium: 4.5 mmol/L (ref 3.5–5.1)
Sodium: 140 mmol/L (ref 135–145)

## 2020-02-04 MED ORDER — METOPROLOL SUCCINATE ER 50 MG PO TB24: 75.0000 mg | ORAL_TABLET | Freq: Two times a day (BID) | ORAL | 3 refills | Status: DC

## 2020-02-04 NOTE — Patient Instructions (Signed)
Increase Metoprolol to 75 mg Twice daily   Labs done today, your results will be available in MyChart, we will contact you for abnormal readings.  We have provided you with an order for home oxygen at night, your sleep study was done in 2018 so not sure [facility] will [facility] you, they may want to repeat the test first  Your physician recommends that you schedule a follow-up appointment in: 3 months  If you have any questions or concerns before your next appointment please send [place] a message through [place] or call our office at [phone].  At the Advanced [facility] Failure Clinic, you and your health needs are our priority. As part of our continuing mission to provide you with exceptional heart care, we have created designated Provider Care Teams. These Care Teams include your primary Cardiologist (physician) and Advanced Practice Providers (APPs- Physician Assistants and Nurse Practitioners) who all work together to provide you with the care you need, when you need it.   You may see any of the following providers on your designated Care Team at your next follow up: Marland Kitchen Dr Arvilla Meres . Dr Marca Ancona . Tonye Becket, NP . Robbie Lis, PA . Karle Plumber, PharmD   Please be sure to bring in all your medications bottles to every appointment.

## 2020-02-04 NOTE — Progress Notes (Signed)
PCP: Raliegh Ip, DO Cardiology: Dr. Wyline Mood HF Cardiology: Dr. Shirlee Latch  64 y.o.with history of paroxysmal atrial fibrillation, CAD, and chronic systolic CHF was referred by Dr. Wyline Mood for CHF clinic evaluation.    Patient had no cardiac history prior to 2/18.  In 2/18, he developed the onset of exertional dyspnea. After steadily worsening dyspnea, he went to the ER at [facility] and was admitted.  Echo in 2/18 showed EF < 20% with severe MR.  He went back into NSR spontaneously.  He followed up with Dr. Wyline Mood and was set up for a cath in 3/18.  He was found to have a chronically occluded LCx and a long proximal to mid LAD stenosis.  No intervention was done.  He was noted to be back in rapid atrial fibrillation and was admitted.  He was diuresed and started on amiodarone, he converted back to NSR again spontaneously.    He underwent successful CTO procedure in 8/18 with DES to LCx, DES to pLAD, DES to dLAD.    In 10/18, he had atrial fibrillation ablation.  Now off amiodarone.   Cardiac MRI in 12/18 showed EF up to 44%.  Echo in 12/19 showed EF 40%. Echo in 1/21 showed EF 35-40% with diffuse hypokinesis and normal RV.   He returns for followup of CHF.  He is working full time as a Data processing manager.  He is short of breath only with heavy exertion. Fatigues more easily than in the past.  No chest pain. No palpitations. He is in NSR today.  No orthopnea/PND.  No chest pain.     ECG (personally reviewed): NSR, 1st degree AVB  Labs (4/18): K 4.5, creatinine 1.0 Labs (5/18): K 4.2, creatinine 1.25, LFTs normal, TSH normal Labs (6/18): LDL 14, HDL 28 Labs (8/18): K 4.1 => 3.9, creatinine 1.0 => 1.14, LFTs normal, TSH normal Labs (10/18): hgb 13.8, K 4.4, creatinine 1.02 Labs (1/19): hgb 13.6, TSH normal, K 4.6, creatinine 1.05 Labs (02/11/2018): K 4.2 Creatinine 0.99 Labs (9/19): K 4.6, creatinine 0.99, hgb 13.6, LDL 31, HDL 28 Labs (2/20): K 4.5, creatinine 0.86 Labs (7/20): K 4.4,  creatinine 1.33 Labs (1/21): K 4.4, creatinine 1.04, LDL 40  PMH: 1. Atrial fibrillation: Paroxysmal.  2. 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  - Echo (2/18) with mild LV dilation, EF < 20%, severe mitral regurgitation.  - RHC/LHC (3/18): long 80% proximal-mid LAD stenosis, total occlusion of the LCx with left to left collaterals, RCA ok. Mean RA 7, PA 32/10, mean PCWP 16, LVEDP 12, CI 1.54.  - Echo (6/18) with EF 30-35%, diffuse hypokinesis, normal RV size and systolic function, trivial MR.  - Echo (12/18): EF 35%, diffuse hypokinesis, normal RV size with mildly decreased systolic function.  - Cardiac MRI (12/18): EF 44%, small area of LGE in mid anteroseptal wall suggestive of prior infarction. - Echo (12/19): EF 40%, diffuse hypokinesis, normal RV size with mildly decreased systolic function.    - Echo (6/23): EF 35-40%, diffuse hypokinesis, normal RV size and systolic function.  3. CAD: LHC (3/18) with long 80% proximal-mid LAD stenosis, total occlusion of the LCx with left to left collaterals, RCA ok. - 8/18: CTO intervention with DES to LCx and DES x 2 to proximal and mid LAD.  4. Mitral regurgitation: Severe on 2/18 echo.  Suspect functional, as MR only trivial on 6/18 echo.  5. Sleep study (6/18) with no OSA  but nocturnal hypoxemia.  He was instructed to start 2 liters oxygen at night but his insurance would not cover.   6. Allergic rhinitis 7. PVCs - Zio patch 12/19: 1.1% PVCs, 4 runs NSVT, longest 10 beats.   SH: Recruitment consultant and school custodian, lives in [place], quit smoking in 2011, married.   FH: Grandfather with MIs, mother with CABG and CHF.   Review of systems complete and found to be negative unless listed in HPI.    Current Outpatient Medications  Medication Sig Dispense Refill  . acetaminophen (TYLENOL) 325 MG tablet Take 650 mg by mouth every 6 (six) hours as needed  (FOR HEADACHES/SINUS ISSUES.).    Marland Kitchen atorvastatin (LIPITOR) 80 MG tablet TAKE 1 TABLET DAILY AT 6 P.M. 90 tablet 3  . ELIQUIS 5 MG TABS tablet TAKE 1 TABLET TWICE A DAY 180 tablet 3  . empagliflozin (JARDIANCE) 10 MG TABS tablet Take 10 mg by mouth daily before breakfast. 90 tablet 3  . ENTRESTO 97-103 MG TAKE 1 TABLET TWICE A DAY 180 tablet 3  . finasteride (PROSCAR) 5 MG tablet Take 1 tablet (5 mg total) by mouth daily. 90 tablet 0  . furosemide (LASIX) 20 MG tablet Take 1 tablet (20 mg total) by mouth daily as needed (for swelling or weight gain of 3 Lbs.). 30 tablet 3  . metoprolol succinate (TOPROL-XL) 50 MG 24 hr tablet Take 1.5 tablets (75 mg total) by mouth 2 (two) times daily. Take with or immediately following a meal. 270 tablet 3  . nitroGLYCERIN (NITROSTAT) 0.4 MG SL tablet Place 1 tablet (0.4 mg total) under the tongue every 5 (five) minutes as needed. 25 tablet 2  . Omega-3 Fatty Acids (FISH OIL) 1000 MG CAPS Take 1,000 mg by mouth every other day.     . spironolactone (ALDACTONE) 25 MG tablet TAKE 1 TABLET AT BEDTIME 90 tablet 3   No current facility-administered medications for this encounter.   BP 120/70   Pulse 65   Wt 127.4 kg (280 lb 12.8 oz)   SpO2 96%   BMI 34.18 kg/m    Wt Readings from Last 3 Encounters:  02/04/20 127.4 kg (280 lb 12.8 oz)  11/24/19 127.2 kg (280 lb 6.4 oz)  11/07/19 127.1 kg (280 lb 3.2 oz)    General: NAD Neck: No JVD, no thyromegaly or thyroid nodule.  Lungs: Clear to auscultation bilaterally with normal respiratory effort. CV: Nondisplaced PMI.  Heart regular S1/S2, no S3/S4, no murmur.  No peripheral edema.  No carotid bruit.  Normal pedal pulses.  Abdomen: Soft, nontender, no hepatosplenomegaly, no distention.  Skin: Intact without lesions or rashes.  Neurologic: Alert and oriented x 3.  Psych: Normal affect. Extremities: No clubbing or cyanosis.  HEENT: Normal.   Assessment/Plan: 1. Atrial fibrillation: Paroxysmal.  Now s/p atrial  fibrillation ablation in 10/18.  NSR today.  He is now off amiodarone.  - Continue Eliquis for anticoagulation.   2. 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  Echo in 2/18 with EF < 20%.  EF 30-35% in 6/18 and 35% on repeat echo 12/18. Cardiac MRI in 12/18 to determine need for ICD showed EF 44%. Echo in 1/21 showed EF 35-40%. NYHA class II symptoms.  He is not volume overloaded on exam.  - Increase Toprol XL to 75 mg bid.   - Continue Entresto 97/103 bid, check BMET today.    - Continue spironolactone 25 daily.  -  Continue empagliflozin.  - Continue to use Lasix only prn.  3. CAD: s/p DES to LCx and LAD in 05/2017.  No chest pain.  - No ASA given stable CAD and use of Eliquis.  - Continue statin. Good lipids in 1/21.  4. Mitral regurgitation: Minimal on 1/21 echo.    5. PVCs: 1.1% on 12/19 Zio patch with 4 runs NSVT.  Toprol XL was increased, he is not feeling palpitations.   Followup in 3 months  Kevin Oconnell 02/04/2020

## 2020-05-07 ENCOUNTER — Ambulatory Visit (HOSPITAL_COMMUNITY)
Admission: RE | Admit: 2020-05-07 | Discharge: 2020-05-07 | Disposition: A | Discharge status: Home / Self Care | Source: Ambulatory Visit | Attending: Cardiology | Admitting: Cardiology

## 2020-05-07 ENCOUNTER — Encounter (HOSPITAL_COMMUNITY): Payer: Self-pay | Admitting: Cardiology

## 2020-05-07 ENCOUNTER — Other Ambulatory Visit: Payer: Self-pay

## 2020-05-07 VITALS — BP 112/78 | HR 66 | Wt 279.0 lb

## 2020-05-07 DIAGNOSIS — I2510 Atherosclerotic heart disease of native coronary artery without angina pectoris: Secondary | ICD-10-CM | POA: Insufficient documentation

## 2020-05-07 DIAGNOSIS — I480 Paroxysmal atrial fibrillation: Secondary | ICD-10-CM | POA: Diagnosis not present

## 2020-05-07 DIAGNOSIS — I340 Nonrheumatic mitral (valve) insufficiency: Secondary | ICD-10-CM | POA: Diagnosis not present

## 2020-05-07 DIAGNOSIS — Z87891 Personal history of nicotine dependence: Secondary | ICD-10-CM | POA: Diagnosis not present

## 2020-05-07 DIAGNOSIS — Z955 Presence of coronary angioplasty implant and graft: Secondary | ICD-10-CM | POA: Insufficient documentation

## 2020-05-07 DIAGNOSIS — Z8249 Family history of ischemic heart disease and other diseases of the circulatory system: Secondary | ICD-10-CM | POA: Insufficient documentation

## 2020-05-07 DIAGNOSIS — I5022 Chronic systolic (congestive) heart failure: Secondary | ICD-10-CM | POA: Insufficient documentation

## 2020-05-07 DIAGNOSIS — Z79899 Other long term (current) drug therapy: Secondary | ICD-10-CM | POA: Insufficient documentation

## 2020-05-07 DIAGNOSIS — Z7901 Long term (current) use of anticoagulants: Secondary | ICD-10-CM | POA: Insufficient documentation

## 2020-05-07 LAB — BASIC METABOLIC PANEL
Anion gap: 7 (ref 5–15)
BUN: 14 mg/dL (ref 8–23)
CO2: 26 mmol/L (ref 22–32)
Calcium: 9.4 mg/dL (ref 8.9–10.3)
Chloride: 105 mmol/L (ref 98–111)
Creatinine, Ser: 0.91 mg/dL (ref 0.61–1.24)
GFR calc Af Amer: >60 mL/min (ref >60)
GFR calc non Af Amer: >60 mL/min (ref >60)
Glucose, Bld: 112 mg/dL — ABNORMAL HIGH (ref 70–99)
Potassium: 4.2 mmol/L (ref 3.5–5.1)
Sodium: 138 mmol/L (ref 135–145)

## 2020-05-07 MED ORDER — METOPROLOL SUCCINATE ER 100 MG PO TB24: 100.0000 mg | ORAL_TABLET | Freq: Two times a day (BID) | ORAL | 3 refills | Status: DC

## 2020-05-07 NOTE — Patient Instructions (Signed)
Increase Metoprolol to 100 mg Twice daily   Labs done today, your results will be available in MyChart, we will contact you for abnormal readings.  Please call our office in December to schedule your follow up appointment and echocardiogram  If you have any questions or concerns before your next appointment please send [place] a message through [place] or call our office at [phone].    TO LEAVE A MESSAGE FOR THE NURSE SELECT OPTION 2, PLEASE LEAVE A MESSAGE INCLUDING: . YOUR NAME . DATE OF BIRTH . CALL BACK NUMBER . REASON FOR CALL**this is important as we prioritize the call backs  YOU WILL RECEIVE A CALL BACK THE SAME DAY AS LONG AS YOU CALL BEFORE 4:00 PM  At the [facility], you and your health needs are our priority. As part of our continuing mission to provide you with exceptional heart care, we have created designated Provider Care Teams. These Care Teams include your primary Cardiologist (physician) and Advanced Practice Providers (APPs- Physician Assistants and Nurse Practitioners) who all work together to provide you with the care you need, when you need it.   You may see any of the following providers on your designated Care Team at your next follow up: Marland Kitchen Dr Arvilla Meres . Dr Marca Ancona . Tonye Becket, NP . Robbie Lis, PA . Karle Plumber, PharmD   Please be sure to bring in all your medications bottles to every appointment.

## 2020-05-09 NOTE — Progress Notes (Addendum)
PCP: Raliegh Ip, DO Cardiology: Dr. Wyline Mood HF Cardiology: Dr. Shirlee Latch  64 y.o.with history of paroxysmal atrial fibrillation, CAD, and chronic systolic CHF was referred by Dr. Wyline Mood for CHF clinic evaluation.    Patient had no cardiac history prior to 2/18.  In 2/18, he developed the onset of exertional dyspnea. After steadily worsening dyspnea, he went to the ER at [facility] and was admitted.  Echo in 2/18 showed EF < 20% with severe MR.  He went back into NSR spontaneously.  He followed up with Dr. Wyline Mood and was set up for a cath in 3/18.  He was found to have a chronically occluded LCx and a long proximal to mid LAD stenosis.  No intervention was done.  He was noted to be back in rapid atrial fibrillation and was admitted.  He was diuresed and started on amiodarone, he converted back to NSR again spontaneously.    He underwent successful CTO procedure in 8/18 with DES to LCx, DES to pLAD, DES to dLAD.    In 10/18, he had atrial fibrillation ablation.  Now off amiodarone.   Cardiac MRI in 12/18 showed EF up to 44%.  Echo in 12/19 showed EF 40%. Echo in 1/21 showed EF 35-40% with diffuse hypokinesis and normal RV.   He returns for followup of CHF.  He is working full time as a Data processing manager.  Weight is stable.  Mainly bothered by arthritis pains.  Working 12 hours a day, gets fatigued and reports poor sleep due to neck pain (keeps him up).  No chest pain.  Good endurance, dyspnea only with heavy lifting. No dyspnea walking on flat ground or up stairs.    ECG (personally reviewed): NSR, PVC  Labs (4/18): K 4.5, creatinine 1.0 Labs (5/18): K 4.2, creatinine 1.25, LFTs normal, TSH normal Labs (6/18): LDL 14, HDL 28 Labs (8/18): K 4.1 => 3.9, creatinine 1.0 => 1.14, LFTs normal, TSH normal Labs (10/18): hgb 13.8, K 4.4, creatinine 1.02 Labs (1/19): hgb 13.6, TSH normal, K 4.6, creatinine 1.05 Labs (02/11/2018): K 4.2 Creatinine 0.99 Labs (9/19): K 4.6, creatinine 0.99, hgb  13.6, LDL 31, HDL 28 Labs (2/20): K 4.5, creatinine 0.86 Labs (7/20): K 4.4, creatinine 1.33 Labs (1/21): K 4.4, creatinine 1.04, LDL 40 Labs (4/21): K 4.5, creatinine 0.99  PMH: 1. Atrial fibrillation: Paroxysmal.  2. 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  - Echo (2/18) with mild LV dilation, EF < 20%, severe mitral regurgitation.  - RHC/LHC (3/18): long 80% proximal-mid LAD stenosis, total occlusion of the LCx with left to left collaterals, RCA ok. Mean RA 7, PA 32/10, mean PCWP 16, LVEDP 12, CI 1.54.  - Echo (6/18) with EF 30-35%, diffuse hypokinesis, normal RV size and systolic function, trivial MR.  - Echo (12/18): EF 35%, diffuse hypokinesis, normal RV size with mildly decreased systolic function.  - Cardiac MRI (12/18): EF 44%, small area of LGE in mid anteroseptal wall suggestive of prior infarction. - Echo (12/19): EF 40%, diffuse hypokinesis, normal RV size with mildly decreased systolic function.    - Echo (7/56): EF 35-40%, diffuse hypokinesis, normal RV size and systolic function.  3. CAD: LHC (3/18) with long 80% proximal-mid LAD stenosis, total occlusion of the LCx with left to left collaterals, RCA ok. - 8/18: CTO intervention with DES to LCx and DES x 2 to proximal and mid LAD.  4. Mitral regurgitation: Severe on 2/18 echo.  Suspect functional, as MR only trivial on 6/18 echo.  5. Sleep study (6/18) with no OSA but nocturnal hypoxemia.  He was instructed to start 2 liters oxygen at night but his insurance would not cover.   6. Allergic rhinitis 7. PVCs - Zio patch 12/19: 1.1% PVCs, 4 runs NSVT, longest 10 beats.   SH: Midwife and school custodian, lives in [place], quit smoking in 2011, married.   FH: Grandfather with MIs, mother with CABG and CHF.   Review of systems complete and found to be negative unless listed in HPI.    Current Outpatient Medications   Medication Sig Dispense Refill  . acetaminophen (TYLENOL) 325 MG tablet Take 650 mg by mouth every 6 (six) hours as needed (FOR HEADACHES/SINUS ISSUES.).    Marland Kitchen atorvastatin (LIPITOR) 80 MG tablet TAKE 1 TABLET DAILY AT 6 P.M. 90 tablet 3  . ELIQUIS 5 MG TABS tablet TAKE 1 TABLET TWICE A DAY 180 tablet 3  . empagliflozin (JARDIANCE) 10 MG TABS tablet Take 10 mg by mouth daily before breakfast. 90 tablet 3  . ENTRESTO 97-103 MG TAKE 1 TABLET TWICE A DAY 180 tablet 3  . finasteride (PROSCAR) 5 MG tablet Take 1 tablet (5 mg total) by mouth daily. 90 tablet 0  . metoprolol succinate (TOPROL-XL) 100 MG 24 hr tablet Take 1 tablet (100 mg total) by mouth 2 (two) times daily. Take with or immediately following a meal. 180 tablet 3  . Multiple Vitamins-Minerals (MENS MULTIVITAMIN PO) Take by mouth.    . Omega-3 Fatty Acids (FISH OIL) 1000 MG CAPS Take 1,000 mg by mouth every other day.     . spironolactone (ALDACTONE) 25 MG tablet TAKE 1 TABLET AT BEDTIME 90 tablet 3  . furosemide (LASIX) 20 MG tablet Take 1 tablet (20 mg total) by mouth daily as needed (for swelling or weight gain of 3 Lbs.). (Patient not taking: Reported on 05/07/2020) 30 tablet 3  . nitroGLYCERIN (NITROSTAT) 0.4 MG SL tablet Place 1 tablet (0.4 mg total) under the tongue every 5 (five) minutes as needed. (Patient not taking: Reported on 05/07/2020) 25 tablet 2   No current facility-administered medications for this encounter.   BP 112/78   Pulse 66   Wt (!) 126.6 kg (279 lb)   SpO2 97%   BMI 33.96 kg/m    Wt Readings from Last 3 Encounters:  05/07/20 (!) 126.6 kg (279 lb)  02/04/20 127.4 kg (280 lb 12.8 oz)  11/24/19 127.2 kg (280 lb 6.4 oz)    General: NAD Neck: No JVD, no thyromegaly or thyroid nodule.  Lungs: Clear to auscultation bilaterally with normal respiratory effort. CV: Nondisplaced PMI.  Heart regular S1/S2, no S3/S4, no murmur.  No peripheral edema.  No carotid bruit.  Normal pedal pulses.  Abdomen: Soft,  nontender, no hepatosplenomegaly, no distention.  Skin: Intact without lesions or rashes.  Neurologic: Alert and oriented x 3.  Psych: Normal affect. Extremities: No clubbing or cyanosis.  HEENT: Normal.   Assessment/Plan: 1. Atrial fibrillation: Paroxysmal.  Now s/p atrial fibrillation ablation in 10/18.  NSR today.  He is now off amiodarone.  - Continue Eliquis for anticoagulation.   2. 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  Echo in 2/18 with EF < 20%.  EF 30-35% in 6/18 and 35% on repeat echo 12/18. Cardiac MRI in 12/18 to determine need for ICD showed EF 44%. Echo in 1/21 showed EF 35-40%. NYHA  class II symptoms.  He is not volume overloaded on exam.  - Increase Toprol XL to 100 mg bid.   - Continue Entresto 97/103 bid, BMET today.   - Continue spironolactone 25 daily.  - Continue empagliflozin.  - Continue to use Lasix only prn.  - Repeat echo at 6 month followup.  So far, EF has been out of ICD range.  3. CAD: s/p DES to LCx and LAD in 05/2017.  No chest pain.  - No ASA given stable CAD and use of Eliquis.  - Continue statin. Good lipids in 1/21.  4. Mitral regurgitation: Minimal on 1/21 echo.    5. PVCs: 1.1% on 12/19 Zio patch with 4 runs NSVT.  Toprol XL was increased, he is not feeling palpitations.   Followup in 6 months with echo.   Marca Ancona 05/09/2020

## 2020-05-20 ENCOUNTER — Other Ambulatory Visit: Payer: Self-pay

## 2020-05-20 ENCOUNTER — Encounter: Payer: Self-pay | Admitting: Nurse Practitioner

## 2020-05-20 ENCOUNTER — Ambulatory Visit (INDEPENDENT_AMBULATORY_CARE_PROVIDER_SITE_OTHER): Admitting: Nurse Practitioner

## 2020-05-20 VITALS — BP 92/65 | HR 72 | Temp 96.5°F | Ht 76.0 in | Wt 281.4 lb

## 2020-05-20 DIAGNOSIS — R339 Retention of urine, unspecified: Secondary | ICD-10-CM | POA: Diagnosis not present

## 2020-05-20 DIAGNOSIS — G8929 Other chronic pain: Secondary | ICD-10-CM

## 2020-05-20 DIAGNOSIS — M7918 Myalgia, other site: Secondary | ICD-10-CM

## 2020-05-20 LAB — URINALYSIS, DIPSTICK ONLY
Bilirubin, UA: NEGATIVE
Ketones, UA: NEGATIVE
Leukocytes,UA: NEGATIVE
Nitrite, UA: NEGATIVE
Protein,UA: NEGATIVE
RBC, UA: NEGATIVE
Specific Gravity, UA: 1.02 (ref 1.005–1.030)
Urobilinogen, Ur: 0.2 mg/dL (ref 0.2–1.0)
pH, UA: 5 (ref 5.0–7.5)

## 2020-05-20 NOTE — Assessment & Plan Note (Signed)
Patient is a 64 year old patient who reports to clinic today with urinary retention, patient is reporting burning with urination and dribbling.  Patient is sexually active and reporting ejaculation problems, with some erectile dysfunction.  He denies pain and reports only slight discomfort.  Patient is not having any nausea vomiting or abdominal pain.  After assessment labs ordered PSA, to check for BPH, UA to check for urinary tract infection.  Stat referral to urology completed. Provided education to patient with printed handouts. Referral completed Patient knows to follow-up with worsening or unresolved symptoms.

## 2020-05-20 NOTE — Patient Instructions (Addendum)
Chronic musculoskeletal pain Patient's chronic musculoskeletal pain is now well managed.  Patient continues to experience generalized pain in his legs neck, back and shoulders.  Patient is reporting pain is making him have less quality of life.  Patient states going to orthopedic did not help resolve his issues.  Currently using only Tylenol for pain with no therapeutic effects.  And would like to be evaluated by a neurologist. Provided education to patient with printed handout given.  Advised patient to use Tylenol for pain, continue to apply ice and rest joints. Neuro consult will be considered. Patient to follow-up with worsening or unresolved symptoms.  Urine retention Patient is a 64 year old patient who reports to clinic today with urinary retention, patient is reporting burning with urination and dribbling.  Patient is sexually active and reporting ejaculation problems, with some erectile dysfunction.  He denies pain and reports only slight discomfort.  Patient is not having any nausea vomiting or abdominal pain.  After assessment labs ordered PSA, to check for BPH, UA to check for urinary tract infection.  Stat referral to urology completed. Provided education to patient with printed handouts. Referral completed Patient knows to follow-up with worsening or unresolved symptoms.      Acute Urinary Retention, Male  Acute urinary retention means that you cannot pee (urinate) at all, or that you pee too little and your bladder is not emptied completely. If it is not treated, it can lead to kidney damage or other serious problems. Follow these instructions at home:  Take over-the-counter and prescription medicines only as told by your doctor. Ask your doctor what medicines you should stay away from. Do not take any medicine unless your doctor says it is okay to do so.  If you were sent home with a tube that drains the bladder (catheter), take care of it as told by your doctor.  Drink  enough fluid to keep your pee clear or pale yellow.  If you were given an antibiotic, take it as told by your doctor. Do not stop taking the antibiotic even if you start to feel better.  Do not use any products that contain nicotine or tobacco, such as cigarettes and e-cigarettes. If you need help quitting, ask your doctor.  Watch for changes in your symptoms. Tell your doctor about them.  If told, track changes in your blood pressure at home. Tell your doctor about them.  Keep all follow-up visits as told by your doctor. This is important. Contact a doctor if:  You have spasms or you leak pee when you have spasms. Get help right away if:  You have chills or a fever.  You have a tube that drains the bladder and: ? The tube stops draining pee. ? The tube falls out.  You have blood in your pee. Summary  Acute urinary retention means that you have problems peeing. It may mean that you cannot pee at all, or that you pee too little.  If this condition is not treated, it can lead to kidney damage or other serious problems.  If you were sent home with a tube that drains the bladder, take care of it as told by your doctor.  Monitor any changes in your symptoms. Tell your doctor about any changes. This information is not intended to replace advice given to you by your health care provider. Make sure you discuss any questions you have with your health care provider. Document Revised: 12/19/2018 Document Reviewed: 11/03/2016 Elsevier Patient Education  2020 [employer].

## 2020-05-20 NOTE — Assessment & Plan Note (Signed)
Patient's chronic musculoskeletal pain is now well managed.  Patient continues to experience generalized pain in his legs neck, back and shoulders.  Patient is reporting pain is making him have less quality of life.  Patient states going to orthopedic did not help resolve his issues.  Currently using only Tylenol for pain with no therapeutic effects.  And would like to be evaluated by a neurologist. Provided education to patient with printed handout given.  Advised patient to use Tylenol for pain, continue to apply ice and rest joints. Neuro consult will be considered. Patient to follow-up with worsening or unresolved symptoms.
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
## 2020-05-21 LAB — PSA, TOTAL AND FREE
PSA, Free Pct: 25.2 %
PSA, Free: 0.53 ng/mL
Prostate Specific Ag, Serum: 2.1 ng/mL (ref 0.0–4.0)

## 2020-08-24 ENCOUNTER — Other Ambulatory Visit (HOSPITAL_COMMUNITY): Payer: Self-pay | Admitting: *Deleted

## 2020-08-24 MED ORDER — ATORVASTATIN CALCIUM 80 MG PO TABS: ORAL_TABLET | ORAL | 3 refills | Status: DC

## 2020-08-24 MED ORDER — EMPAGLIFLOZIN 10 MG PO TABS: 10.0000 mg | ORAL_TABLET | Freq: Every day | ORAL | 3 refills | Status: DC

## 2020-10-30 ENCOUNTER — Emergency Department (HOSPITAL_COMMUNITY)
Admission: EM | Admit: 2020-10-30 | Discharge: 2020-10-30 | Disposition: A | Discharge status: Home / Self Care | Attending: Emergency Medicine | Admitting: Emergency Medicine

## 2020-10-30 ENCOUNTER — Encounter (HOSPITAL_COMMUNITY): Payer: Self-pay | Admitting: Emergency Medicine

## 2020-10-30 ENCOUNTER — Other Ambulatory Visit: Payer: Self-pay

## 2020-10-30 DIAGNOSIS — R55 Syncope and collapse: Secondary | ICD-10-CM | POA: Diagnosis not present

## 2020-10-30 DIAGNOSIS — I5022 Chronic systolic (congestive) heart failure: Secondary | ICD-10-CM | POA: Insufficient documentation

## 2020-10-30 DIAGNOSIS — Z7901 Long term (current) use of anticoagulants: Secondary | ICD-10-CM | POA: Insufficient documentation

## 2020-10-30 DIAGNOSIS — I2510 Atherosclerotic heart disease of native coronary artery without angina pectoris: Secondary | ICD-10-CM | POA: Diagnosis not present

## 2020-10-30 DIAGNOSIS — I499 Cardiac arrhythmia, unspecified: Secondary | ICD-10-CM | POA: Insufficient documentation

## 2020-10-30 DIAGNOSIS — Z79899 Other long term (current) drug therapy: Secondary | ICD-10-CM | POA: Insufficient documentation

## 2020-10-30 DIAGNOSIS — J45909 Unspecified asthma, uncomplicated: Secondary | ICD-10-CM | POA: Diagnosis not present

## 2020-10-30 DIAGNOSIS — Z87891 Personal history of nicotine dependence: Secondary | ICD-10-CM | POA: Insufficient documentation

## 2020-10-30 DIAGNOSIS — R001 Bradycardia, unspecified: Secondary | ICD-10-CM

## 2020-10-30 LAB — CBC
HCT: 47.2 % (ref 39.0–52.0)
Hemoglobin: 15.5 g/dL (ref 13.0–17.0)
MCH: 29.7 pg (ref 26.0–34.0)
MCHC: 32.8 g/dL (ref 30.0–36.0)
MCV: 90.4 fL (ref 80.0–100.0)
Platelets: 242 10*3/uL (ref 150–400)
RBC: 5.22 MIL/uL (ref 4.22–5.81)
RDW: 13.8 % (ref 11.5–15.5)
WBC: 5.8 10*3/uL (ref 4.0–10.5)
nRBC: 0 % (ref 0.0–0.2)

## 2020-10-30 LAB — BASIC METABOLIC PANEL
Anion gap: 6 (ref 5–15)
BUN: 16 mg/dL (ref 8–23)
CO2: 25 mmol/L (ref 22–32)
Calcium: 9.4 mg/dL (ref 8.9–10.3)
Chloride: 107 mmol/L (ref 98–111)
Creatinine, Ser: 0.99 mg/dL (ref 0.61–1.24)
GFR, Estimated: >60 mL/min (ref >60)
Glucose, Bld: 95 mg/dL (ref 70–99)
Potassium: 4.2 mmol/L (ref 3.5–5.1)
Sodium: 138 mmol/L (ref 135–145)

## 2020-10-30 LAB — URINALYSIS, ROUTINE W REFLEX MICROSCOPIC
Bacteria, UA: NONE SEEN
Bilirubin Urine: NEGATIVE
Glucose, UA: >=500 mg/dL — AB
Hgb urine dipstick: NEGATIVE
Ketones, ur: NEGATIVE mg/dL
Leukocytes,Ua: NEGATIVE
Nitrite: NEGATIVE
Protein, ur: NEGATIVE mg/dL
Specific Gravity, Urine: 1.011 (ref 1.005–1.030)
pH: 5 (ref 5.0–8.0)

## 2020-10-30 LAB — TROPONIN I (HIGH SENSITIVITY)
Troponin I (High Sensitivity): 2 ng/L (ref <18)
Troponin I (High Sensitivity): 2 ng/L (ref <18)

## 2020-10-30 LAB — HEPATIC FUNCTION PANEL
ALT: 38 U/L (ref 0–44)
AST: 21 U/L (ref 15–41)
Albumin: 4 g/dL (ref 3.5–5.0)
Alkaline Phosphatase: 58 U/L (ref 38–126)
Bilirubin, Direct: 0.2 mg/dL (ref 0.0–0.2)
Indirect Bilirubin: 0.7 mg/dL (ref 0.3–0.9)
Total Bilirubin: 0.9 mg/dL (ref 0.3–1.2)
Total Protein: 6.6 g/dL (ref 6.5–8.1)
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
## 2020-10-30 NOTE — ED Triage Notes (Signed)
Pt states he felt like he was going to pass out and he check his pulse. He states his pulse was in the 30's and he felt flutters in his chest.  Pt had an ablation procedure in either 2018 or 2019 and is scheduled to have an echo November 05, 2020. Pt denies any problems prior to today.

## 2020-10-30 NOTE — Discharge Instructions (Addendum)
Call your Physician on Monday to schedule to be seen for recheck.  Return if any problems.

## 2020-11-05 ENCOUNTER — Ambulatory Visit (HOSPITAL_COMMUNITY)
Admission: RE | Admit: 2020-11-05 | Discharge: 2020-11-05 | Disposition: A | Discharge status: Home / Self Care | Source: Ambulatory Visit | Attending: Cardiology | Admitting: Cardiology

## 2020-11-05 ENCOUNTER — Other Ambulatory Visit (HOSPITAL_COMMUNITY): Payer: Self-pay | Admitting: Cardiology

## 2020-11-05 ENCOUNTER — Ambulatory Visit (HOSPITAL_COMMUNITY)
Admission: RE | Admit: 2020-11-05 | Discharge: 2020-11-05 | Disposition: A | Discharge status: Home / Self Care | Source: Ambulatory Visit | Attending: Family Medicine | Admitting: Family Medicine

## 2020-11-05 ENCOUNTER — Other Ambulatory Visit: Payer: Self-pay

## 2020-11-05 ENCOUNTER — Encounter (HOSPITAL_COMMUNITY): Payer: Self-pay | Admitting: Cardiology

## 2020-11-05 ENCOUNTER — Ambulatory Visit (HOSPITAL_BASED_OUTPATIENT_CLINIC_OR_DEPARTMENT_OTHER)
Admission: RE | Admit: 2020-11-05 | Discharge: 2020-11-05 | Disposition: A | Discharge status: Home / Self Care | Source: Ambulatory Visit | Attending: Cardiology | Admitting: Cardiology

## 2020-11-05 VITALS — BP 128/80 | HR 72 | Wt 276.0 lb

## 2020-11-05 DIAGNOSIS — Z8249 Family history of ischemic heart disease and other diseases of the circulatory system: Secondary | ICD-10-CM | POA: Diagnosis not present

## 2020-11-05 DIAGNOSIS — Z87891 Personal history of nicotine dependence: Secondary | ICD-10-CM | POA: Diagnosis not present

## 2020-11-05 DIAGNOSIS — E785 Hyperlipidemia, unspecified: Secondary | ICD-10-CM | POA: Insufficient documentation

## 2020-11-05 DIAGNOSIS — I495 Sick sinus syndrome: Secondary | ICD-10-CM

## 2020-11-05 DIAGNOSIS — I2510 Atherosclerotic heart disease of native coronary artery without angina pectoris: Secondary | ICD-10-CM | POA: Diagnosis not present

## 2020-11-05 DIAGNOSIS — I5022 Chronic systolic (congestive) heart failure: Secondary | ICD-10-CM

## 2020-11-05 DIAGNOSIS — N401 Enlarged prostate with lower urinary tract symptoms: Secondary | ICD-10-CM | POA: Insufficient documentation

## 2020-11-05 DIAGNOSIS — R011 Cardiac murmur, unspecified: Secondary | ICD-10-CM | POA: Diagnosis not present

## 2020-11-05 DIAGNOSIS — I480 Paroxysmal atrial fibrillation: Secondary | ICD-10-CM | POA: Insufficient documentation

## 2020-11-05 LAB — LIPID PANEL
Cholesterol: 104 mg/dL (ref 0–200)
HDL: 27 mg/dL — ABNORMAL LOW (ref >40)
LDL Cholesterol: 51 mg/dL (ref 0–99)
Total CHOL/HDL Ratio: 3.9 RATIO
Triglycerides: 130 mg/dL (ref <150)
VLDL: 26 mg/dL (ref 0–40)

## 2020-11-05 LAB — ECHOCARDIOGRAM COMPLETE
Area-P 1/2: 1.4 cm2
Calc EF: 43.3 %
S' Lateral: 5 cm
Single Plane A2C EF: 45.4 %
Single Plane A4C EF: 40.5 %

## 2020-11-05 LAB — PSA: Prostatic Specific Antigen: 1.46 ng/mL (ref 0.00–4.00)

## 2020-11-05 MED ORDER — APIXABAN 5 MG PO TABS: 5.0000 mg | ORAL_TABLET | Freq: Two times a day (BID) | ORAL | 3 refills | Status: DC

## 2020-11-05 MED ORDER — METOPROLOL SUCCINATE ER 100 MG PO TB24: ORAL_TABLET | ORAL | 3 refills | Status: DC

## 2020-11-05 MED ORDER — ENTRESTO 97-103 MG PO TABS: 1.0000 | ORAL_TABLET | Freq: Two times a day (BID) | ORAL | 3 refills | Status: DC

## 2020-11-05 MED ORDER — METOPROLOL SUCCINATE ER 50 MG PO TB24: ORAL_TABLET | ORAL | 3 refills | Status: DC

## 2020-11-05 NOTE — Progress Notes (Signed)
Zio patch placed onto patient.  All instructions and information reviewed with patient, they verbalize understanding with no questions. 

## 2020-11-05 NOTE — Progress Notes (Signed)
  Echocardiogram 2D Echocardiogram has been performed.  Kevin Oconnell 11/05/2020, 11:38 AM

## 2020-11-05 NOTE — Patient Instructions (Addendum)
DECREASE Toprol XL 100mg  (1 tablet) in the morning and 50mg  (1/2 tablet) in the evening  Labs done today, your results will be available in MyChart, we will contact you for abnormal readings.  Your provider has recommended that  you wear a Zio Patch for 14 days.  This monitor will record your heart rhythm for our review.  IF you have any symptoms while wearing the monitor please press the button.  If you have any issues with the patch or you notice a red or orange light on it please call the company at [phone].  Once you remove the patch please mail it back to the company as soon as possible so we can get the results.  Your physician has requested that you have a cardiac MRI. Cardiac MRI uses a computer to create images of your heart as its beating, producing both still and moving pictures of your heart and major blood vessels. For further information please visit . Please follow the instruction sheet given to you today for more information. ONCE WE APPROVE WITH INSURANCE WE WILL CONTACT YOU TO SCHEDULE  Your physician recommends that you schedule a follow-up appointment in: 2 months  If you have any questions or concerns before your next appointment please send [phone] a message through [place] or call our office at [phone].    TO LEAVE A MESSAGE FOR THE NURSE SELECT OPTION 2, PLEASE LEAVE A MESSAGE INCLUDING: . YOUR NAME . DATE OF BIRTH . CALL BACK NUMBER . REASON FOR CALL**this is important as we prioritize the call backs  YOU WILL RECEIVE A CALL BACK THE SAME DAY AS LONG AS YOU CALL BEFORE 4:00 PM

## 2020-11-07 NOTE — Progress Notes (Signed)
PCP: Raliegh Ip, DO Cardiology: Dr. Wyline Mood HF Cardiology: Dr. Shirlee Latch  65 y.o.with history of paroxysmal atrial fibrillation, CAD, and chronic systolic CHF was referred by Dr. Wyline Mood for CHF clinic evaluation.    Patient had no cardiac history prior to 2/18.  In 2/18, he developed the onset of exertional dyspnea. After steadily worsening dyspnea, he went to the ER at [facility] and was admitted.  Echo in 2/18 showed EF < 20% with severe MR.  He went back into NSR spontaneously.  He followed up with Dr. Wyline Mood and was set up for a cath in 3/18.  He was found to have a chronically occluded LCx and a long proximal to mid LAD stenosis.  No intervention was done.  He was noted to be back in rapid atrial fibrillation and was admitted.  He was diuresed and started on amiodarone, he converted back to NSR again spontaneously.    He underwent successful CTO procedure in 8/18 with DES to LCx, DES to pLAD, DES to dLAD.    In 10/18, he had atrial fibrillation ablation.  Now off amiodarone.   Cardiac MRI in 12/18 showed EF up to 44%.  Echo in 12/19 showed EF 40%. Echo in 1/21 showed EF 35-40% with diffuse hypokinesis and normal RV.   Patient had a presyncopal episode at home 10/30/20.  He was lightheaded but did not pass out.  On his pulse ox, HR was down to 30.  He went to the ER, HR was in 60s in the ER.  He was sent home.  Echo was done today and reviewed, EF 35-40% range with global hypokinesis, mild LVH, mildly decreased RV systolic function.   He returns for followup of CHF.  He is working full time as a Data processing manager.  Weight is down 3 lbs.  No further presyncope/syncope.   He has been losing weight.  No significant exertional dyspnea or chest pain.  Walking some on treadmill for exercise.   ECG (personally reviewed): NSR, low voltage poor RWP  Labs (4/18): K 4.5, creatinine 1.0 Labs (5/18): K 4.2, creatinine 1.25, LFTs normal, TSH normal Labs (6/18): LDL 14, HDL 28 Labs (8/18): K 4.1  => 3.9, creatinine 1.0 => 1.14, LFTs normal, TSH normal Labs (10/18): hgb 13.8, K 4.4, creatinine 1.02 Labs (1/19): hgb 13.6, TSH normal, K 4.6, creatinine 1.05 Labs (02/11/2018): K 4.2 Creatinine 0.99 Labs (9/19): K 4.6, creatinine 0.99, hgb 13.6, LDL 31, HDL 28 Labs (2/20): K 4.5, creatinine 0.86 Labs (7/20): K 4.4, creatinine 1.33 Labs (1/21): K 4.4, creatinine 1.04, LDL 40 Labs (4/21): K 4.5, creatinine 0.99 Labs (1/22): K 4.2, creatinine 0.99  PMH: 1. Atrial fibrillation: Paroxysmal.  2. 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  - Echo (2/18) with mild LV dilation, EF < 20%, severe mitral regurgitation.  - RHC/LHC (3/18): long 80% proximal-mid LAD stenosis, total occlusion of the LCx with left to left collaterals, RCA ok. Mean RA 7, PA 32/10, mean PCWP 16, LVEDP 12, CI 1.54.  - Echo (6/18) with EF 30-35%, diffuse hypokinesis, normal RV size and systolic function, trivial MR.  - Echo (12/18): EF 35%, diffuse hypokinesis, normal RV size with mildly decreased systolic function.  - Cardiac MRI (12/18): EF 44%, small area of LGE in mid anteroseptal wall suggestive of prior infarction. - Echo (12/19): EF 40%, diffuse hypokinesis, normal RV size with mildly decreased systolic function.    - Echo (8/78): EF  35-40%, diffuse hypokinesis, normal RV size and systolic function.  - Echo (1/22): EF 35-40% range with global hypokinesis, mild LVH, mildly decreased RV systolic function. 3. CAD: LHC (3/18) with long 80% proximal-mid LAD stenosis, total occlusion of the LCx with left to left collaterals, RCA ok. - 8/18: CTO intervention with DES to LCx and DES x 2 to proximal and mid LAD.  4. Mitral regurgitation: Severe on 2/18 echo.  Suspect functional, as MR only trivial on 6/18 echo.  5. Sleep study (6/18) with no OSA but nocturnal hypoxemia.  He was instructed to start 2 liters oxygen at night but his  insurance would not cover.   6. Allergic rhinitis 7. PVCs - Zio patch 12/19: 1.1% PVCs, 4 runs NSVT, longest 10 beats.   SH: Midwife and school custodian, lives in [place], quit smoking in 2011, married.   FH: Grandfather with MIs, mother with CABG and CHF.   Review of systems complete and found to be negative unless listed in HPI.    Current Outpatient Medications  Medication Sig Dispense Refill  . empagliflozin (JARDIANCE) 10 MG TABS tablet Take 1 tablet (10 mg total) by mouth daily before breakfast. 90 tablet 3  . finasteride (PROSCAR) 5 MG tablet Take 1 tablet (5 mg total) by mouth daily. 90 tablet 0  . spironolactone (ALDACTONE) 25 MG tablet TAKE 1 TABLET AT BEDTIME 90 tablet 3  . tamsulosin (FLOMAX) 0.4 MG CAPS capsule Take 0.4 mg by mouth daily.    Marland Kitchen apixaban (ELIQUIS) 5 MG TABS tablet Take 1 tablet (5 mg total) by mouth 2 (two) times daily. 180 tablet 3  . atorvastatin (LIPITOR) 80 MG tablet TAKE 1 TABLET DAILY AT 6 P.M. 90 tablet 3  . metoprolol succinate (TOPROL-XL) 50 MG 24 hr tablet Take 2 tablets (100 mg total) by mouth in the morning AND 1 tablet (50 mg total) daily after supper. Take with or immediately following a meal.. 270 tablet 3  . sacubitril-valsartan (ENTRESTO) 97-103 MG Take 1 tablet by mouth 2 (two) times daily. 180 tablet 3   No current facility-administered medications for this encounter.   BP 128/80   Pulse 72   Wt 125.2 kg (276 lb)   SpO2 97%   BMI 33.60 kg/m    Wt Readings from Last 3 Encounters:  11/05/20 125.2 kg (276 lb)  10/30/20 126.1 kg (278 lb)  05/20/20 127.6 kg (281 lb 6.4 oz)    General: NAD Neck: No JVD, no thyromegaly or thyroid nodule.  Lungs: Clear to auscultation bilaterally with normal respiratory effort. CV: Nondisplaced PMI.  Heart regular S1/S2, no S3/S4, no murmur.  No peripheral edema.  No carotid bruit.  Normal pedal pulses.  Abdomen: Soft, nontender, no hepatosplenomegaly, no distention.  Skin: Intact without lesions  or rashes.  Neurologic: Alert and oriented x 3.  Psych: Normal affect. Extremities: No clubbing or cyanosis.  HEENT: Normal.   Assessment/Plan: 1. Atrial fibrillation: Paroxysmal.  Now s/p atrial fibrillation ablation in 10/18.  NSR today.  He is now off amiodarone.  - Continue Eliquis for anticoagulation.   2. 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  Echo in 2/18 with EF < 20%.  EF 30-35% in 6/18 and 35% on repeat echo 12/18. Cardiac MRI in 12/18 to determine need for ICD showed EF 44%. Echo in 1/21 showed EF 35-40%.  Echo in 1/22 with EF about 35-40%.  NYHA class II symptoms.  He is  not volume overloaded on exam.  - With bradycardia and episode of presyncope, I will decrease Toprol XL to 100 qam/50 qpm.    - Continue Entresto 97/103 bid, recent BMET stable.    - Continue spironolactone 25 daily.  - Continue empagliflozin.  - Continue to use Lasix only prn.  - EF is borderline for ICD.  Given presyncopal episode and concern for arrhythmia, I will arrange for cardiac MRI to more closely quantify EF for ?ICD.  3. CAD: s/p DES to LCx and LAD in 05/2017.  No chest pain.  - No ASA given stable CAD and use of Eliquis.  - Continue statin. Check lipids.   4. Mitral regurgitation: Minimal on 1/22 echo.    5. PVCs: 1.1% on 12/19 Zio patch with 4 runs NSVT.   6. Presyncope: HR low on his pulse ox at home at the time.  ?bradycardic event.  - Zio patch x 14 days.  - Decrease Toprol XL as above.    Followup in 2 months.   Kevin Oconnell 11/07/2020

## 2020-11-29 NOTE — Addendum Note (Signed)
Encounter addended by: Crissie Figures, RN on: 11/29/2020 3:07 PM  Actions taken: Imaging Exam ended

## 2020-12-03 ENCOUNTER — Other Ambulatory Visit: Payer: Self-pay

## 2020-12-03 ENCOUNTER — Ambulatory Visit (INDEPENDENT_AMBULATORY_CARE_PROVIDER_SITE_OTHER): Admitting: Family Medicine

## 2020-12-03 ENCOUNTER — Encounter: Payer: Self-pay | Admitting: Family Medicine

## 2020-12-03 VITALS — BP 93/63 | HR 79 | Temp 97.6°F | Ht 76.0 in | Wt 283.0 lb

## 2020-12-03 DIAGNOSIS — M2550 Pain in unspecified joint: Secondary | ICD-10-CM

## 2020-12-03 DIAGNOSIS — I480 Paroxysmal atrial fibrillation: Secondary | ICD-10-CM

## 2020-12-03 DIAGNOSIS — I5022 Chronic systolic (congestive) heart failure: Secondary | ICD-10-CM | POA: Diagnosis not present

## 2020-12-03 DIAGNOSIS — R3914 Feeling of incomplete bladder emptying: Secondary | ICD-10-CM

## 2020-12-03 DIAGNOSIS — R768 Other specified abnormal immunological findings in serum: Secondary | ICD-10-CM | POA: Diagnosis not present

## 2020-12-03 DIAGNOSIS — N401 Enlarged prostate with lower urinary tract symptoms: Secondary | ICD-10-CM

## 2020-12-03 NOTE — Patient Instructions (Signed)
You had labs performed today.  You will be contacted with the results of the labs once they are available, usually in the next 3 business days for routine lab work.  If you have an active my chart account, they will be released to your MyChart.  If you prefer to have these labs released to you via telephone, please let us know.  If you had a pap smear or biopsy performed, expect to be contacted in about 7-10 days.   Rheumatoid Arthritis Rheumatoid arthritis (RA) is a long-term (chronic) disease. RA causes inflammation in your joints. Your joints may feel painful, stiff, swollen, and warm. RA may start slowly. It most often affects the small joints of the hands and feet. It can also affect other parts of the body. Symptoms of RA often come and go. There is no cure for RA, but medicines can help your symptoms. What are the causes?  RA is an autoimmune disease. This means that your body's defense system (immune system) attacks healthy parts of your body by mistake. The exact cause of RA is not known. What increases the risk?  Being a woman.  Having a family history of RA or other diseases like RA.  Smoking.  Being overweight.  Being exposed to pollutants or chemicals. What are the signs or symptoms?  Morning stiffness that lasts longer than 30 minutes. This is often the first symptom.  Symptoms start slowly. They are often worse in the morning.  As RA gets worse, symptoms may include: ? Pain, stiffness, swelling, warmth, and tenderness in joints on both sides of your body. ? Loss of energy. ? Not feeling hungry. ? Weight loss. ? A low fever. ? Dry eyes and a dry mouth. ? Firm lumps that grow under your skin. ? Changes in the way your joints look. ? Changes in the way your joints work.  Symptoms vary and they: ? Often come and go. ? Sometimes get worse for a period of time. These are called flares. How is this treated?  Treatment may include: ? Taking good care of yourself.  Be sure to rest as needed, eat a healthy diet, and exercise. ? Medicines. These may include:  Pain relievers.  Medicines to help with inflammation.  Disease-modifying antirheumatic drugs (DMARDs).  Medicines called biologic response modifiers. ? Physical therapy and occupational therapy. ? Surgery, if joint damage is very bad. Your doctor will work with you to find the best treatments.   Follow these instructions at home: Activity  Return to your normal activities as told by your doctor. Ask your doctor what activities are safe for you.  Rest when you have a flare.  Exercise as told by your doctor. General instructions  Take over-the-counter and prescription medicines only as told by your doctor.  Keep all follow-up visits as told by your doctor. This is important. Where to find more information  [employer]: www.rheumatology.org  Arthritis Foundation: www.arthritis.org Contact a doctor if:  You have a flare.  You have a fever.  You have problems because of your medicines. Get help right away if:  You have chest pain.  You have trouble breathing.  You get a hot, painful joint all of a sudden, and it is worse than your normal joint aches. Summary  RA is a long-term disease.  Symptoms of RA start slowly. They are often worse in the morning.  RA causes inflammation in your joints. This information is not intended to replace advice given to you  by your health care provider. Make sure you discuss any questions you have with your health care provider. Document Revised: 06/05/2018 Document Reviewed: 06/05/2018 Elsevier Patient Education  2021 [employer].
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
## 2020-12-04 LAB — RHEUMATOID FACTOR: Rheumatoid fact SerPl-aCnc: 48 IU/mL — ABNORMAL HIGH (ref <14.0)

## 2020-12-04 LAB — C-REACTIVE PROTEIN: CRP: <1 mg/L (ref 0–10)

## 2020-12-04 LAB — SEDIMENTATION RATE: Sed Rate: 2 mm/hr (ref 0–30)

## 2020-12-04 LAB — ANA W/REFLEX IF POSITIVE: Anti Nuclear Antibody (ANA): NEGATIVE

## 2020-12-06 ENCOUNTER — Encounter: Payer: Self-pay | Admitting: Family Medicine

## 2020-12-15 ENCOUNTER — Ambulatory Visit (HOSPITAL_COMMUNITY): Admission: RE | Admit: 2020-12-15 | Source: Ambulatory Visit

## 2020-12-31 ENCOUNTER — Telehealth (HOSPITAL_COMMUNITY): Payer: Self-pay | Admitting: Emergency Medicine

## 2020-12-31 NOTE — Telephone Encounter (Signed)
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  Denies claustro  [place] Dec

## 2021-01-03 ENCOUNTER — Ambulatory Visit (HOSPITAL_COMMUNITY)
Admission: RE | Admit: 2021-01-03 | Discharge: 2021-01-03 | Disposition: A | Discharge status: Home / Self Care | Source: Ambulatory Visit | Attending: Cardiology | Admitting: Cardiology

## 2021-01-03 ENCOUNTER — Other Ambulatory Visit: Payer: Self-pay

## 2021-01-03 DIAGNOSIS — I5022 Chronic systolic (congestive) heart failure: Secondary | ICD-10-CM

## 2021-01-03 MED ORDER — GADOBUTROL 1 MMOL/ML IV SOLN
10.0000 mL | Freq: Once | INTRAVENOUS | Status: AC | PRN
  Administered 2021-01-03: 10 mL via INTRAVENOUS

## 2021-01-17 ENCOUNTER — Encounter: Payer: Self-pay | Admitting: Internal Medicine

## 2021-01-17 ENCOUNTER — Ambulatory Visit (INDEPENDENT_AMBULATORY_CARE_PROVIDER_SITE_OTHER): Admitting: Internal Medicine

## 2021-01-17 ENCOUNTER — Other Ambulatory Visit: Payer: Self-pay

## 2021-01-17 VITALS — BP 110/74 | HR 62 | Ht 75.0 in | Wt 284.4 lb

## 2021-01-17 DIAGNOSIS — R229 Localized swelling, mass and lump, unspecified: Secondary | ICD-10-CM | POA: Insufficient documentation

## 2021-01-17 DIAGNOSIS — M0580 Other rheumatoid arthritis with rheumatoid factor of unspecified site: Secondary | ICD-10-CM

## 2021-01-17 DIAGNOSIS — E559 Vitamin D deficiency, unspecified: Secondary | ICD-10-CM | POA: Diagnosis not present
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
## 2021-01-17 NOTE — Patient Instructions (Addendum)
I do not see obvious evidence of current joint inflammation related to rheumatoid arthritis. It is possible to have degenerative arthritis in combination with this, or long term residual damage of past inflammation is also possible.   Anticyclic-Citrullinated Peptide Antibody Test Why am I having this test? You may have the anticyclic-citrullinated peptide antibody test done to help:  Diagnose rheumatoid arthritis (RA). RA is a long-term (chronic) disease that causes inflammation in the joints.  Determine the severity of your RA, including how much worse it is getting (progression). This test may be done if you have unexplained joint inflammation and have previously tested negative for rheumatoid factor. It may also be done if you have been diagnosed with undifferentiated arthritis and your health care provider suspects rheumatoid arthritis. What is being tested? This test checks your blood for the presence of anticyclic-citrullinated peptide antibodies. Antibodies are cells that are part of the body's disease-fighting (immune) system. These antibodies appear early in the course of RA and are thought to be directly involved in the progression of the disease. What kind of sample is taken? A blood sample is required for this test. It is usually collected by inserting a needle into a blood vessel.   How are the results reported? Your test results will be reported as either positive or negative. A result is considered negative if there is less than 20 units of the antibody per mL of blood. What do the results mean?  A positive blood test may mean that you have RA.  A negative blood test means that it is less likely that you have RA. However, a negative test does not completely rule out rheumatoid arthritis. Talk with your health care provider about what your results mean. Questions to ask your health care provider Ask your health care provider, or the department that is doing the test:  When  will my results be ready?  How will I get my results?  What are my treatment options?  What other tests do I need?  What are my next steps? Summary  The anticyclic-citrullinated peptide antibody blood test may be done to help your health care provider diagnose rheumatoid arthritis (RA).  This test checks your blood for the presence of anticyclic-citrullinated peptide antibodies. These antibodies appear early in the course of RA.  A positive blood test may mean that you have RA.    Vitamin D Test Why am I having this test? Vitamin D is a vitamin that your body makes when you are exposed to sunlight. It is also found naturally in fish and eggs, and it is added to some foods, such as cereals and dairy products. You need vitamin D to maintain bone health and to support your disease-fighting (immune) system. You may have a vitamin D test:  To help diagnose osteoporosis or other bone disorders.  To monitor treatment of osteoporosis or other bone disorders.  If you have symptoms of a lack (deficiency) of vitamin D, such as experiencing broken bones from minor injuries.  If you lack certain nutrients in your diet (dietary deficiencies).  If you have problems absorbing nutrients during digestion. What is being tested? There are two types of vitamin D tests that measure slightly different things:  The 25-hydroxyvitamin D test measures how much of a substance called 25-hydroxyvitamin D is in your blood. The body converts vitamin D to 25-hydroxyvitamin D in the liver. Measuring how much 25-hydroxyvitamin D is in your blood provides a good idea of your vitamin D levels. This  is the most common type of vitamin D test.  The 1,25-dihydroxyvitamin D test measures how much of a substance called 1,25-dihydroxyvitamin D is in your blood. The body converts vitamin D into this substance and then uses it for various functions. This test provides an idea of your total vitamin D levels. What kind of  sample is taken? A blood sample is required for this test. It is usually collected by inserting a needle into a blood vessel or by sticking a finger with a small needle.   Tell a health care provider about:  Any allergies you have.  All medicines you are taking, including vitamins, herbs, eye drops, creams, and over-the-counter medicines.  Any blood disorders you have.  Any surgeries you have had.  Any medical conditions you have.  Whether you are pregnant or may be pregnant. How are the results reported? Your test results will be reported as a value that tells you how much vitamin D is in your blood. Your health care provider will compare your results to normal ranges that were established after testing a large group of people (reference ranges). Reference ranges may vary among labs and hospitals. For vitamin D tests, common reference ranges are:  25-hydroxyvitamin D: 25-80 ng/mL.  1,25-dihydroxyvitamin D: ? Males: 18-64 pg/mL. ? Females: 18-78 pg/mL. What do the results mean? If your result is within your reference range, this means that you have a normal amount of vitamin D in your blood. If your result is lower than your reference range, this means that you have too little vitamin D in your body.  If you had the 25-hydroxyvitamin D test specifically: ? A result of 21-29 ng/mL means that you have slightly lower levels of vitamin D than normal (vitamin D insufficiency). ? A result of 20 ng/mL or less means that you have very low levels of vitamin D (vitamin D deficiency).  Low levels of vitamin D can indicate: ? Not having enough exposure to sunlight. ? Not eating enough foods that contain vitamin D. ? Osteoporosis. ? Kidney disease. ? Liver disease. ? A bone disease that makes the bones weak, soft, or poorly developed (rickets). ? Softening of the bones (osteomalacia). ? The body not absorbing vitamin D normally (gastrointestinal malabsorption). If your result is higher  than your reference range, this means that you have too much vitamin D in your body. This can result from:  Taking too many dietary supplements.  Hyperparathyroidism. This is a rare condition in which you do not make enough of a certain type of hormone (parathyroid hormone or PTH).  A disease that causes inflammation in your organs and other areas of your body (sarcoidosis).  A rare developmental disorder that is present at birth Jimmye Norman syndrome). Talk with your health care provider about what your results mean. Questions to ask your health care provider Ask your health care provider, or the department that is doing the test:  When will my results be ready?  How will I get my results?  What are my treatment options?  What other tests do I need?  What are my next steps? Summary  You may have a vitamin D test to determine if your body has enough vitamin D. You need vitamin D to maintain bone health and to support your disease-fighting (immune) system.  Your vitamin D level is determined with a blood test.  Talk with your health care provider about what your results mean.    Ferritin Test Why am I having this test?  The ferritin test is performed to determine if you have anemia due to iron deficiency. The test provides an indication of how much iron is stored in your body. What is being tested? This test measures the level of ferritin in your blood. Ferritin helps your body make red blood cells and the protein hemoglobin. Over time, a low ferritin level will result in a low hemoglobin and red blood cell count. This can lead to symptoms of iron deficiency, such as shortness of breath. What kind of sample is taken? A blood sample is required for this test. It is usually collected by inserting a needle into a blood vessel.   How are the results reported? Your test results will be reported as values. Your health care provider will compare your results to normal ranges that were  established after testing a large group of people (reference ranges). Reference ranges may vary among labs and hospitals. For this test, common reference ranges are:  Males: 12-300 ng/mL or 12-300 mcg/L (SI units).  Females: 10-150 ng/mL or 10-150 mcg/L (SI units).  Children or adolescents: ? Newborn: 25-200 ng/mL. ? Less than or equal to 44 month old: 200-600 ng/mL. ? 2-5 months old: 50-200 ng/mL. ? 6 months to 62 years old: 7-142 ng/mL. What do the results mean?  Results that are above the reference range may indicate: ? Anemia due to causes other than iron deficiency. These include alcoholism. ? Inflammatory diseases. Examples include collagen vascular disease and chronic hepatitis. ? Certain types of cancer. These include leukemia. ? Disorders that cause iron overload in the body, such as hemochromatosis or hemosiderosis.  Results that are below the reference range may indicate iron deficiency anemia. Talk with your health care provider about what your results mean. Questions to ask your health care provider Ask your health care provider, or the department that is doing the test:  When will my results be ready?  How will I get my results?  What are my treatment options?  What other tests do I need?  What are my next steps? Summary  The ferritin test is performed to determine if you have anemia due to iron deficiency. The test provides an indication of how much iron is stored in your body.  Ferritin helps your body make red blood cells and the protein hemoglobin.  Levels of ferritin that are above or below the reference range may indicate some diseases, such as anemia, leukemia, hepatitis, or hemochromatosis.  Talk with your health care provider about what your results mean.   Erythrocyte Sedimentation Rate Test Why am I having this test? The erythrocyte sedimentation rate (ESR) test is used to help find illnesses related to:  Sudden (acute) or long-term (chronic)  infections.  Inflammation.  The body's disease-fighting system attacking healthy cells (autoimmune diseases).  Cancer.  Tissue death. If you have symptoms that may be related to any of these illnesses, your health care provider may do an ESR test before doing more specific tests. If you have an inflammatory immune disease, such as rheumatoid arthritis, you may have this test to help monitor your therapy. What is being tested? This test measures how long it takes for your red blood cells (erythrocytes) to settle in a solution over a certain amount of time (sedimentation rate). When you have an infection or inflammation, your red blood cells clump together and settle faster. The sedimentation rate provides information about how much inflammation is present in the body. What kind of sample is taken? A blood  sample is required for this test. It is usually collected by inserting a needle into a blood vessel.   How do I prepare for this test? Follow any instructions from your health care provider about changing or stopping your regular medicines. Tell a health care provider about:  Any allergies you have.  All medicines you are taking, including vitamins, herbs, eye drops, creams, and over-the-counter medicines.  Any blood disorders you have.  Any surgeries you have had.  Any medical conditions you have, such as thyroid or kidney disease.  Whether you are pregnant or may be pregnant. How are the results reported? Your results will be reported as a value that measures sedimentation rate in millimeters per hour (mm/hr). Your health care provider will compare your results to normal ranges that were established after testing a large group of people (reference values). Reference values may vary among labs and hospitals. For this test, common reference values, which vary by age and gender, are:  Newborn: 0-2 mm/hr.  Child, up to puberty: 0-10 mm/hr.  Male: ? Under 50 years: 0-20  mm/hr. ? 50-85 years: 0-30 mm/hr. ? Over 85 years: 0-42 mm/hr.  Male: ? Under 50 years: 0-15 mm/hr. ? 50-85 years: 0-20 mm/hr. ? Over 85 years: 0-30 mm/hr. Certain conditions or medicines may cause ESR levels to be falsely lower or higher, such as:  Pregnancy.  Obesity.  Steroids, birth control pills, and blood thinners.  Thyroid or kidney disease. What do the results mean? Results that are within reference values are considered normal, meaning that the level of inflammation in your body is healthy. High ESR levels mean that there is inflammation in your body. You will have more tests to help make a diagnosis. Inflammation may result from many different conditions or injuries. Talk with your health care provider about what your results mean. Questions to ask your health care provider Ask your health care provider, or the department that is doing the test:  When will my results be ready?  How will I get my results?  What are my treatment options?  What other tests do I need?  What are my next steps? Summary  The erythrocyte sedimentation rate (ESR) test is used to help find illnesses associated with sudden (acute) or long-term (chronic) infections, inflammation, autoimmune diseases, cancer, or tissue death.  If you have symptoms that may be related to any of these illnesses, your health care provider may do an ESR test before doing more specific tests. If you have an inflammatory immune disease, such as rheumatoid arthritis, you may have this test to help monitor your therapy.  This test measures how long it takes for your red blood cells (erythrocytes) to settle in a solution over a certain amount of time (sedimentation rate). This provides information about how much inflammation is present in the body. This information is not intended to replace advice given to you by your health care provider. Make sure you discuss any questions you have with your health care  provider. Document Revised: 06/04/2020 Document Reviewed: 06/04/2020 Elsevier Patient Education  [place].

## 2021-01-18 ENCOUNTER — Ambulatory Visit (HOSPITAL_COMMUNITY)
Admission: RE | Admit: 2021-01-18 | Discharge: 2021-01-18 | Disposition: A | Discharge status: Home / Self Care | Source: Ambulatory Visit | Attending: Cardiology | Admitting: Cardiology

## 2021-01-18 ENCOUNTER — Encounter (HOSPITAL_COMMUNITY): Payer: Self-pay | Admitting: Cardiology

## 2021-01-18 ENCOUNTER — Other Ambulatory Visit: Payer: Self-pay

## 2021-01-18 VITALS — BP 102/70 | HR 62 | Wt 285.0 lb

## 2021-01-18 DIAGNOSIS — I2510 Atherosclerotic heart disease of native coronary artery without angina pectoris: Secondary | ICD-10-CM | POA: Insufficient documentation

## 2021-01-18 DIAGNOSIS — I340 Nonrheumatic mitral (valve) insufficiency: Secondary | ICD-10-CM | POA: Insufficient documentation

## 2021-01-18 DIAGNOSIS — Z955 Presence of coronary angioplasty implant and graft: Secondary | ICD-10-CM | POA: Insufficient documentation

## 2021-01-18 DIAGNOSIS — I480 Paroxysmal atrial fibrillation: Secondary | ICD-10-CM | POA: Insufficient documentation

## 2021-01-18 DIAGNOSIS — I252 Old myocardial infarction: Secondary | ICD-10-CM | POA: Insufficient documentation

## 2021-01-18 DIAGNOSIS — Z79899 Other long term (current) drug therapy: Secondary | ICD-10-CM | POA: Diagnosis not present

## 2021-01-18 DIAGNOSIS — Z87891 Personal history of nicotine dependence: Secondary | ICD-10-CM | POA: Diagnosis not present

## 2021-01-18 DIAGNOSIS — Z7901 Long term (current) use of anticoagulants: Secondary | ICD-10-CM | POA: Insufficient documentation

## 2021-01-18 DIAGNOSIS — I5022 Chronic systolic (congestive) heart failure: Secondary | ICD-10-CM

## 2021-01-18 DIAGNOSIS — Z8249 Family history of ischemic heart disease and other diseases of the circulatory system: Secondary | ICD-10-CM | POA: Diagnosis not present

## 2021-01-18 LAB — BASIC METABOLIC PANEL
Anion gap: 5 (ref 5–15)
BUN: 16 mg/dL (ref 8–23)
CO2: 26 mmol/L (ref 22–32)
Calcium: 9 mg/dL (ref 8.9–10.3)
Chloride: 107 mmol/L (ref 98–111)
Creatinine, Ser: 0.91 mg/dL (ref 0.61–1.24)
GFR, Estimated: >60 mL/min (ref >60)
Glucose, Bld: 101 mg/dL — ABNORMAL HIGH (ref 70–99)
Potassium: 4.4 mmol/L (ref 3.5–5.1)
Sodium: 138 mmol/L (ref 135–145)

## 2021-01-18 NOTE — Addendum Note (Signed)
Encounter addended by: Laurey Morale, MD on: 01/18/2021 10:27 PM  Actions taken: Clinical Note Signed, Level of Service modified

## 2021-01-18 NOTE — Patient Instructions (Signed)
Labs done today, your results will be available in MyChart, we will contact you for abnormal readings.  Your physician recommends that you schedule a follow-up appointment in: 3 months, we have provided you a prescription to have this done locally  Please call our office in September to schedule your follow up appointment  If you have any questions or concerns before your next appointment please send [place] a message through [place] or call our office at [phone].    TO LEAVE A MESSAGE FOR THE NURSE SELECT OPTION 2, PLEASE LEAVE A MESSAGE INCLUDING: . YOUR NAME . DATE OF BIRTH . CALL BACK NUMBER . REASON FOR CALL**this is important as we prioritize the call backs  YOU WILL RECEIVE A CALL BACK THE SAME DAY AS LONG AS YOU CALL BEFORE 4:00 PM  At the [facility], you and your health needs are our priority. As part of our continuing mission to provide you with exceptional heart care, we have created designated Provider Care Teams. These Care Teams include your primary Cardiologist (physician) and Advanced Practice Providers (APPs- Physician Assistants and Nurse Practitioners) who all work together to provide you with the care you need, when you need it.   You may see any of the following providers on your designated Care Team at your next follow up: Marland Kitchen Dr Arvilla Meres . Dr Marca Ancona . Dr Thornell Mule . Tonye Becket, NP . Robbie Lis, PA . Shanda Bumps [place],NP . Karle Plumber, PharmD   Please be sure to bring in all your medications bottles to every appointment.

## 2021-01-18 NOTE — Progress Notes (Signed)
PCP: Raliegh Ip, DO Cardiology: Dr. Wyline Mood HF Cardiology: Dr. Shirlee Latch  64 y.o.with history of paroxysmal atrial fibrillation, CAD, and chronic systolic CHF was referred by Dr. Wyline Mood for CHF clinic evaluation.    Patient had no cardiac history prior to 2/18.  In 2/18, he developed the onset of exertional dyspnea. After steadily worsening dyspnea, he went to the ER at [facility] and was admitted.  Echo in 2/18 showed EF < 20% with severe MR.  He went back into NSR spontaneously.  He followed up with Dr. Wyline Mood and was set up for a cath in 3/18.  He was found to have a chronically occluded LCx and a long proximal to mid LAD stenosis.  No intervention was done.  He was noted to be back in rapid atrial fibrillation and was admitted.  He was diuresed and started on amiodarone, he converted back to NSR again spontaneously.    He underwent successful CTO procedure in 8/18 with DES to LCx, DES to pLAD, DES to dLAD.    In 10/18, he had atrial fibrillation ablation.  Now off amiodarone.   Cardiac MRI in 12/18 showed EF up to 44%.  Echo in 12/19 showed EF 40%. Echo in 1/21 showed EF 35-40% with diffuse hypokinesis and normal RV.   Patient had a presyncopal episode at home 10/30/20.  He was lightheaded but did not pass out.  On his pulse ox, HR was down to 30.  He went to the ER, HR was in 60s in the ER.  He was sent home.  Echo in 1/22 showed EF 35-40% range with global hypokinesis, mild LVH, mildly decreased RV systolic function.  Cardiac MRI was done again to more closely quantify EF (?ICD), showed LV EF 51%, RVEF 48%, small area of anteroseptal LGE c/w prior MI.   He returns for followup of CHF.  He is working full time as a Data processing manager.  Weight is up about 9 lbs.  Not as active this winter.  No chest pain.  No significant exertional dyspnea. No lightheadedness.  Able to [place] hunt with his grandsons without problems.  No orthopnea/PND.  Joint pain, found to be RF+.  He will be seeing a  rheumatologist.   ECG (personally reviewed): NSR, PVCs, low voltage, right superior axis  Labs (4/18): K 4.5, creatinine 1.0 Labs (5/18): K 4.2, creatinine 1.25, LFTs normal, TSH normal Labs (6/18): LDL 14, HDL 28 Labs (8/18): K 4.1 => 3.9, creatinine 1.0 => 1.14, LFTs normal, TSH normal Labs (10/18): hgb 13.8, K 4.4, creatinine 1.02 Labs (1/19): hgb 13.6, TSH normal, K 4.6, creatinine 1.05 Labs (02/11/2018): K 4.2 Creatinine 0.99 Labs (9/19): K 4.6, creatinine 0.99, hgb 13.6, LDL 31, HDL 28 Labs (2/20): K 4.5, creatinine 0.86 Labs (7/20): K 4.4, creatinine 1.33 Labs (1/21): K 4.4, creatinine 1.04, LDL 40 Labs (4/21): K 4.5, creatinine 0.99 Labs (1/22): K 4.2, creatinine 0.99, LDL 51, HDL 27  PMH: 1. Atrial fibrillation: Paroxysmal.  2. 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  - Echo (2/18) with mild LV dilation, EF < 20%, severe mitral regurgitation.  - RHC/LHC (3/18): long 80% proximal-mid LAD stenosis, total occlusion of the LCx with left to left collaterals, RCA ok. Mean RA 7, PA 32/10, mean PCWP 16, LVEDP 12, CI 1.54.  - Echo (6/18) with EF 30-35%, diffuse hypokinesis, normal RV size and systolic function, trivial MR.  - Echo (12/18): EF 35%, diffuse hypokinesis,  normal RV size with mildly decreased systolic function.  - Cardiac MRI (12/18): EF 44%, small area of LGE in mid anteroseptal wall suggestive of prior infarction. - Echo (12/19): EF 40%, diffuse hypokinesis, normal RV size with mildly decreased systolic function.    - Echo (3/01): EF 35-40%, diffuse hypokinesis, normal RV size and systolic function.  - Echo (1/22): EF 35-40% range with global hypokinesis, mild LVH, mildly decreased RV systolic function. - Cardiac MRI (3/22): LV EF 51%, RVEF 48%, small area of anteroseptal LGE c/w prior MI.  3. CAD: LHC (3/18) with long 80% proximal-mid LAD stenosis, total occlusion of the LCx  with left to left collaterals, RCA ok. - 8/18: CTO intervention with DES to LCx and DES x 2 to proximal and mid LAD.  4. Mitral regurgitation: Severe on 2/18 echo.  Suspect functional, as MR only trivial on 6/18 echo.  5. Sleep study (6/18) with no OSA but nocturnal hypoxemia.  He was instructed to start 2 liters oxygen at night but his insurance would not cover.   6. Allergic rhinitis 7. PVCs - Zio patch 12/19: 1.1% PVCs, 4 runs NSVT, longest 10 beats.  - Zio patch 2/22: 6% PVCs, 13 runs NSVT, longest 15 beats  SH: Bus driver and school custodian, lives in [place], quit smoking in 2011, married.   FH: Grandfather with MIs, mother with CABG and CHF.   Review of systems complete and found to be negative unless listed in HPI.    Current Outpatient Medications  Medication Sig Dispense Refill  . acetaminophen (TYLENOL) 500 MG tablet Take 1,000 mg by mouth every 6 (six) hours as needed.    Marland Kitchen apixaban (ELIQUIS) 5 MG TABS tablet Take 1 tablet (5 mg total) by mouth 2 (two) times daily. 180 tablet 3  . atorvastatin (LIPITOR) 80 MG tablet TAKE 1 TABLET DAILY AT 6 P.M. 90 tablet 3  . diphenhydrAMINE (BENADRYL) 25 MG tablet Take 25 mg by mouth at bedtime as needed.    . empagliflozin (JARDIANCE) 10 MG TABS tablet Take 1 tablet (10 mg total) by mouth daily before breakfast. 90 tablet 3  . finasteride (PROSCAR) 5 MG tablet Take 1 tablet (5 mg total) by mouth daily. 90 tablet 0  . metoprolol succinate (TOPROL-XL) 50 MG 24 hr tablet Take 2 tablets (100 mg total) by mouth in the morning AND 1 tablet (50 mg total) daily after supper. Take with or immediately following a meal.. 270 tablet 3  . sacubitril-valsartan (ENTRESTO) 97-103 MG Take 1 tablet by mouth 2 (two) times daily. 180 tablet 3  . spironolactone (ALDACTONE) 25 MG tablet TAKE 1 TABLET AT BEDTIME 90 tablet 3   No current facility-administered medications for this encounter.   BP 102/70   Pulse 62   Wt 129.3 kg (285 lb)   SpO2 96%   BMI  35.62 kg/m    Wt Readings from Last 3 Encounters:  01/18/21 129.3 kg (285 lb)  01/17/21 129 kg (284 lb 6.4 oz)  12/03/20 128.4 kg (283 lb)    General: NAD Neck: No JVD, no thyromegaly or thyroid nodule.  Lungs: Clear to auscultation bilaterally with normal respiratory effort. CV: Nondisplaced PMI.  Heart regular S1/S2, no S3/S4, no murmur.  No peripheral edema.  No carotid bruit.  Normal pedal pulses.  Abdomen: Soft, nontender, no hepatosplenomegaly, no distention.  Skin: Intact without lesions or rashes.  Neurologic: Alert and oriented x 3.  Psych: Normal affect. Extremities: No clubbing or cyanosis.  HEENT: Normal.  Assessment/Plan: 1. Atrial fibrillation: Paroxysmal.  Now s/p atrial fibrillation ablation in 10/18.  NSR today.  He is now off amiodarone.  - Continue Eliquis for anticoagulation.   2. 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  Echo in 2/18 with EF < 20%.  EF 30-35% in 6/18 and 35% on repeat echo 12/18. Cardiac MRI in 12/18 to determine need for ICD showed EF 44%. Echo in 1/21 showed EF 35-40%.  Echo in 1/22 with EF about 35-40%.  Cardiac MRI in 3/22 showed LV EF up to 51%.  NYHA class I-II symptoms.  He is not volume overloaded on exam.  - Continue Toprol XL 100 qam/50 qpm.    - Continue Entresto 97/103 bid.   - Continue spironolactone 25 daily. BMET today.  - Continue empagliflozin.  - Continue to use Lasix only prn.  - EF is out of range for ICD.   3. CAD: s/p DES to LCx and LAD in 05/2017.  No chest pain.  - No ASA given stable CAD and use of Eliquis.  - Continue statin. Good lipids in 1/22.  4. Mitral regurgitation: Minimal on 1/22 echo.    5. PVCs: 6% on 2/22 Zio patch with 13 short runs NSVT.  Minimally symptomatic.  - Continue Toprol XL. Would not restart amiodarone.    Followup in 6 months.  BMET again in 3 months.   Marca Ancona 01/18/2021

## 2021-01-23 ENCOUNTER — Other Ambulatory Visit (HOSPITAL_COMMUNITY): Payer: Self-pay | Admitting: Cardiology

## 2021-01-25 LAB — PROTEIN ELECTROPHORESIS, SERUM
Albumin ELP: 4.2 g/dL (ref 3.8–4.8)
Alpha 1: 0.3 g/dL (ref 0.2–0.3)
Alpha 2: 0.6 g/dL (ref 0.5–0.9)
Beta 2: 0.3 g/dL (ref 0.2–0.5)
Beta Globulin: 0.5 g/dL (ref 0.4–0.6)
Gamma Globulin: 0.8 g/dL (ref 0.8–1.7)
Total Protein: 6.7 g/dL (ref 6.1–8.1)

## 2021-01-25 LAB — ANGIOTENSIN CONVERTING ENZYME: Angiotensin-Converting Enzyme: 22 U/L (ref 9–67)

## 2021-01-25 LAB — IRON,TIBC AND FERRITIN PANEL
%SAT: 31 % (calc) (ref 20–48)
Ferritin: 106 ng/mL (ref 24–380)
Iron: 100 ug/dL (ref 50–180)
TIBC: 325 mcg/dL (calc) (ref 250–425)

## 2021-01-25 LAB — VITAMIN D 25 HYDROXY (VIT D DEFICIENCY, FRACTURES): Vit D, 25-Hydroxy: 22 ng/mL — ABNORMAL LOW (ref 30–100)

## 2021-01-25 LAB — 14-3-3 ETA PROTEIN: 14-3-3 eta Protein: <0.2 ng/mL (ref <0.2)

## 2021-01-25 LAB — SEDIMENTATION RATE: Sed Rate: 6 mm/h (ref 0–20)

## 2021-01-25 LAB — CYCLIC CITRUL PEPTIDE ANTIBODY, IGG: Cyclic Citrullin Peptide Ab: >250 UNITS — ABNORMAL HIGH

## 2021-01-25 LAB — C-REACTIVE PROTEIN: CRP: 1.3 mg/L (ref <8.0)

## 2021-01-27 ENCOUNTER — Other Ambulatory Visit (HOSPITAL_COMMUNITY): Payer: Self-pay

## 2021-02-01 ENCOUNTER — Encounter: Payer: Self-pay | Admitting: Internal Medicine

## 2021-02-01 NOTE — Progress Notes (Signed)
Results show highly positive CCP Ab titer associated with rheumatoid arthritis and the previous positive RF test. Markers of inflammation including ESR, CRP, and ferritin are all normal though. 14-3-3 eta protein is also negative suggesting against active RA. Protein electrophoresis is normal also goes against active RA also against cancer as a cause of the positive tests. Vitamin D test is low, he would benefit from starting a supplement of 1000 units daily or increasing his supplement if already on one. We should follow up based on these results since could indicate disease in the long term. But this can be in about 1-2 months since there was no current evidence of an active disease I would like to observe for any changes or flares of symptoms.

## 2021-02-01 NOTE — Telephone Encounter (Signed)
Yes should schedule follow up for positive RF and positive CCP antibody tests. He did not have active disease at initial visit so recommend f/u in 1-2 months or can be sooner if he has a major change in symptoms.

## 2021-03-11 ENCOUNTER — Ambulatory Visit: Admitting: Internal Medicine
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
## 2021-03-23 ENCOUNTER — Other Ambulatory Visit: Payer: Self-pay

## 2021-03-23 ENCOUNTER — Ambulatory Visit: Admitting: Internal Medicine

## 2021-03-23 ENCOUNTER — Encounter: Payer: Self-pay | Admitting: Internal Medicine

## 2021-03-23 VITALS — BP 94/63 | HR 67 | Ht 75.0 in | Wt 287.4 lb

## 2021-03-23 DIAGNOSIS — R229 Localized swelling, mass and lump, unspecified: Secondary | ICD-10-CM | POA: Diagnosis not present

## 2021-03-23 DIAGNOSIS — M67971 Unspecified disorder of synovium and tendon, right ankle and foot: Secondary | ICD-10-CM | POA: Diagnosis not present

## 2021-03-23 DIAGNOSIS — M0580 Other rheumatoid arthritis with rheumatoid factor of unspecified site: Secondary | ICD-10-CM | POA: Diagnosis not present

## 2021-03-23 MED ORDER — NITROGLYCERIN 0.1 MG/HR TD PT24: 0.1000 mg | MEDICATED_PATCH | Freq: Every day | TRANSDERMAL | 5 refills | Status: DC

## 2021-03-23 NOTE — Patient Instructions (Addendum)
We are checking lab test for any evidence of active inflammatory arthritis at this time. If negative I would recommend observation alone at this time.  For your ankle tendon swelling I recommend trial of topical nitroglycerin patch for this. You can use a 1/2 patch applied over the affected area for this, if noticing any symptoms of low blood pressure such as lightheadedness, dizziness with standing, headaches, or worsened fatigue try discontinuing this.  Nitroglycerin skin patches What is this medicine? NITROGLYCERIN (nye troe GLI ser in) is a type of vasodilator. It relaxes blood vessels, increasing the blood and oxygen supply to your heart. This medicine is used to prevent chest pain caused by angina. It will not help to stop an episode of chest pain. This medicine may be used for other purposes; ask your health care provider or pharmacist if you have questions. COMMON BRAND NAME(S): Deponit, Minitran, Nitrek, Nitro-Dur, Nitrodisc, Transdermal-NTG What should I tell my health care provider before I take this medicine? They need to know if you have any of these conditions:  liver disease  low blood pressure, or low blood volume  previous heart attack or heart failure  an unusual or allergic reaction to nitroglycerin, adhesives, other medicines, foods, dyes, or preservatives  pregnant or trying to get pregnant  breast-feeding How should I use this medicine? This medicine is for external use only. Follow the directions on the prescription label. One patch contains a full day's supply of medicine. It is usually worn for 12 to 14 hours a day and removed for 10 to 12 hours. Apply the patch to an area on the upper body that is clean, dry and hairless. Avoid injured, irritated, calloused, or scarred areas. Use a different site each day to prevent skin irritation. Do not cut or trim the patch. Do not use your medicine more often than directed. Do not stop using this medicine suddenly or your  symptoms may get worse. Ask your doctor or health care professional how to gradually reduce the dose. Talk to your pediatrician regarding the use of this medicine in children. Special care may be needed. Overdosage: If you think you have taken too much of this medicine contact a poison control center or emergency room at once. NOTE: This medicine is only for you. Do not share this medicine with others. What if I miss a dose? If you miss a dose, apply the patch as soon as you can. Do not wear two patches at the same time unless told to by your doctor or health care professional. What may interact with this medicine? Do not take this medicine with any of the following medications:  certain migraine medicines like ergotamine and dihydroergotamine (DHE)  medicines used to treat erectile dysfunction like sildenafil, tadalafil, and vardenafil  riociguat This medicine may also interact with the following medications:  medicines for high blood pressure  other medicines used to treat angina This list may not describe all possible interactions. Give your health care provider a list of all the medicines, herbs, non-prescription drugs, or dietary supplements you use. Also tell them if you smoke, drink alcohol, or use illegal drugs. Some items may interact with your medicine. What should I watch for while using this medicine? Check your heart rate and blood pressure regularly while you are using this medicine. Ask your doctor or health care professional what your heart rate and blood pressure should be and when you should contact him or her. Tell your doctor or health care professional if you  feel your medicine is no longer having any effect. You may get drowsy or dizzy. Do not drive, use machinery, or do anything that needs mental alertness until you know how this drug affects you. Do not stand or sit up quickly, especially if you are an older patient. This reduces the risk of dizzy or fainting spells.  Alcohol can make you more drowsy and dizzy. Avoid alcoholic drinks. If you are going to have a MRI procedure, let your MRI technician know about the use of these patches. Some drug patches contain an aluminum backing that can become heated when exposed to MRI and may cause burns. You may need to temporarily remove the patch during the MRI procedure. If the patch pulls loose or falls off, fold it in half (sticky side in) and throw away out of the reach of children or pets. Replace with a fresh patch. What side effects may I notice from receiving this medicine? Side effects that you should report to your doctor or health care professional as soon as possible:  blurred vision  dry mouth  skin rash, or irritation from the skin patch  sweating  the feeling of extreme pressure in the head  unusually weak or tired Side effects that usually do not require medical attention (report to your doctor or health care professional if they continue or are bothersome):  flushing of the face or neck  headache  irregular heartbeat, palpitations  nausea, vomiting This list may not describe all possible side effects. Call your doctor for medical advice about side effects. You may report side effects to FDA at 1-800-FDA-1088. Where should I keep my medicine? Keep out of the reach of children. Store at room temperature between 15 and 25 degrees C (59 and 77 degrees F). Avoid extremes in temperature and humidity. Throw away any unused medicine after the expiration date.

## 2021-03-24 LAB — SEDIMENTATION RATE: Sed Rate: 2 mm/h (ref 0–20)

## 2021-03-24 LAB — C-REACTIVE PROTEIN: CRP: 1.6 mg/L (ref <8.0)

## 2021-03-27 NOTE — Progress Notes (Signed)
Inflammation markers are again normal today. Based on this and no particular inflamed areas besides the right achilles tendon, which we discussed at clinic, I do not think he needs to start RA treatment at this time. We can certainly follow up if he notices changes or worsening of symptoms.

## 2021-05-02 ENCOUNTER — Other Ambulatory Visit

## 2021-05-02 ENCOUNTER — Other Ambulatory Visit: Payer: Self-pay

## 2021-05-02 DIAGNOSIS — I5022 Chronic systolic (congestive) heart failure: Secondary | ICD-10-CM

## 2021-06-20 ENCOUNTER — Encounter (HOSPITAL_COMMUNITY): Payer: Self-pay

## 2021-06-21 ENCOUNTER — Other Ambulatory Visit (HOSPITAL_COMMUNITY): Payer: Self-pay | Admitting: *Deleted

## 2021-06-21 MED ORDER — FUROSEMIDE 20 MG PO TABS: 20.0000 mg | ORAL_TABLET | Freq: Every day | ORAL | 3 refills | Status: DC | PRN

## 2021-06-21 MED ORDER — METOPROLOL SUCCINATE ER 50 MG PO TB24: ORAL_TABLET | ORAL | 3 refills | Status: DC

## 2021-08-11 ENCOUNTER — Other Ambulatory Visit: Payer: Self-pay

## 2021-08-11 ENCOUNTER — Encounter: Payer: Self-pay | Admitting: Cardiology

## 2021-08-11 ENCOUNTER — Ambulatory Visit (INDEPENDENT_AMBULATORY_CARE_PROVIDER_SITE_OTHER): Admitting: Cardiology

## 2021-08-11 VITALS — BP 126/80 | HR 65 | Ht 76.0 in | Wt 294.2 lb

## 2021-08-11 DIAGNOSIS — I4819 Other persistent atrial fibrillation: Secondary | ICD-10-CM | POA: Diagnosis not present
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
## 2021-08-16 ENCOUNTER — Other Ambulatory Visit: Payer: Self-pay

## 2021-08-16 ENCOUNTER — Ambulatory Visit (HOSPITAL_COMMUNITY)
Admission: RE | Admit: 2021-08-16 | Discharge: 2021-08-16 | Disposition: A | Discharge status: Home / Self Care | Source: Ambulatory Visit | Attending: Cardiology | Admitting: Cardiology

## 2021-08-16 ENCOUNTER — Other Ambulatory Visit (HOSPITAL_COMMUNITY): Payer: Self-pay | Admitting: Cardiology

## 2021-08-16 ENCOUNTER — Encounter (HOSPITAL_COMMUNITY): Payer: Self-pay | Admitting: Cardiology

## 2021-08-16 VITALS — BP 90/60 | HR 66 | Wt 294.4 lb

## 2021-08-16 DIAGNOSIS — I252 Old myocardial infarction: Secondary | ICD-10-CM | POA: Insufficient documentation

## 2021-08-16 DIAGNOSIS — Z8249 Family history of ischemic heart disease and other diseases of the circulatory system: Secondary | ICD-10-CM | POA: Insufficient documentation

## 2021-08-16 DIAGNOSIS — Z7901 Long term (current) use of anticoagulants: Secondary | ICD-10-CM | POA: Insufficient documentation

## 2021-08-16 DIAGNOSIS — Z87891 Personal history of nicotine dependence: Secondary | ICD-10-CM | POA: Insufficient documentation

## 2021-08-16 DIAGNOSIS — Z7984 Long term (current) use of oral hypoglycemic drugs: Secondary | ICD-10-CM | POA: Insufficient documentation

## 2021-08-16 DIAGNOSIS — Z79899 Other long term (current) drug therapy: Secondary | ICD-10-CM | POA: Diagnosis not present

## 2021-08-16 DIAGNOSIS — I493 Ventricular premature depolarization: Secondary | ICD-10-CM | POA: Diagnosis not present

## 2021-08-16 DIAGNOSIS — E785 Hyperlipidemia, unspecified: Secondary | ICD-10-CM

## 2021-08-16 DIAGNOSIS — I340 Nonrheumatic mitral (valve) insufficiency: Secondary | ICD-10-CM | POA: Diagnosis not present

## 2021-08-16 DIAGNOSIS — I480 Paroxysmal atrial fibrillation: Secondary | ICD-10-CM | POA: Diagnosis not present

## 2021-08-16 DIAGNOSIS — Z955 Presence of coronary angioplasty implant and graft: Secondary | ICD-10-CM | POA: Diagnosis not present

## 2021-08-16 DIAGNOSIS — I5022 Chronic systolic (congestive) heart failure: Secondary | ICD-10-CM | POA: Insufficient documentation

## 2021-08-16 DIAGNOSIS — I2510 Atherosclerotic heart disease of native coronary artery without angina pectoris: Secondary | ICD-10-CM | POA: Insufficient documentation

## 2021-08-16 LAB — LIPID PANEL
Cholesterol: 85 mg/dL (ref 0–200)
HDL: 27 mg/dL — ABNORMAL LOW (ref >40)
LDL Cholesterol: 25 mg/dL (ref 0–99)
Total CHOL/HDL Ratio: 3.1 RATIO
Triglycerides: 164 mg/dL — ABNORMAL HIGH (ref <150)
VLDL: 33 mg/dL (ref 0–40)

## 2021-08-16 LAB — BASIC METABOLIC PANEL
Anion gap: 5 (ref 5–15)
BUN: 12 mg/dL (ref 8–23)
CO2: 28 mmol/L (ref 22–32)
Calcium: 9.2 mg/dL (ref 8.9–10.3)
Chloride: 107 mmol/L (ref 98–111)
Creatinine, Ser: 0.98 mg/dL (ref 0.61–1.24)
GFR, Estimated: >60 mL/min (ref >60)
Glucose, Bld: 101 mg/dL — ABNORMAL HIGH (ref 70–99)
Potassium: 4.9 mmol/L (ref 3.5–5.1)
Sodium: 140 mmol/L (ref 135–145)

## 2021-08-16 MED ORDER — METOPROLOL SUCCINATE ER 50 MG PO TB24: ORAL_TABLET | ORAL | 3 refills | Status: DC

## 2021-08-16 NOTE — Patient Instructions (Addendum)
Labs done today. We will contact you only if your labs are abnormal.  Eliquis and Metoprolol have been refilled.   No other medication changes were made. Please continue all current medications as prescribed.  You have been referred to [facility]. They will contact you to schedule an appointment.   Your provider has recommended that you have a home sleep study.  We have provided you with the equipment in our office today. Please download the app and follow the instructions. YOUR PIN NUMBER IS: [id number]. Once you have completed the test you just dispose of the equipment, the information is automatically uploaded to [place] via blue-tooth technology. If your test is positive for sleep apnea and you need a home CPAP machine you will be contacted by Dr Norris Cross office [facility]) to set this up.  Your physician recommends that you schedule a follow-up appointment in: 3 month with an echo prior to your exam  Your physician has requested that you have an echocardiogram. Echocardiography is a painless test that uses sound waves to create images of your heart. It provides your doctor with information about the size and shape of your heart and how well your heart's chambers and valves are working. This procedure takes approximately one hour. There are no restrictions for this procedure.  If you have any questions or concerns before your next appointment please send [place] a message through [place] or call our office at [phone].    TO LEAVE A MESSAGE FOR THE NURSE SELECT OPTION 2, PLEASE LEAVE A MESSAGE INCLUDING: YOUR NAME DATE OF BIRTH CALL BACK NUMBER REASON FOR CALL**this is important as we prioritize the call backs  YOU WILL RECEIVE A CALL BACK THE SAME DAY AS LONG AS YOU CALL BEFORE 4:00 PM   Do the following things EVERYDAY: Weigh yourself in the morning before breakfast. Write it down and keep it in a log. Take your medicines as prescribed Eat low salt foods--Limit salt (sodium) to  2000 mg per day.  Stay as active as you can everyday Limit all fluids for the day to less than 2 liters   At the [facility], you and your health needs are our priority. As part of our continuing mission to provide you with exceptional heart care, we have created designated Provider Care Teams. These Care Teams include your primary Cardiologist (physician) and Advanced Practice Providers (APPs- Physician Assistants and Nurse Practitioners) who all work together to provide you with the care you need, when you need it.   You may see any of the following providers on your designated Care Team at your next follow up: Dr Arvilla Meres Dr Carron Curie, NP Robbie Lis, [place] Karle Plumber, PharmD   Please be sure to bring in all your medications bottles to every appointment.

## 2021-08-16 NOTE — Progress Notes (Signed)
PCP: Janora Norlander, DO Cardiology: Dr. Harl Bowie HF Cardiology: Dr. Aundra Dubin  65 y.o.with history of paroxysmal atrial fibrillation, CAD, and chronic systolic CHF was referred by Dr. Harl Bowie for CHF clinic evaluation.    Patient had no cardiac history prior to 2/18.  In 2/18, he developed the onset of exertional dyspnea. After steadily worsening dyspnea, he went to the ER at [facility] and was admitted.  Echo in 2/18 showed EF < 20% with severe MR.  He went back into NSR spontaneously.  He followed up with Dr. Harl Bowie and was set up for a cath in 3/18.  He was found to have a chronically occluded LCx and a long proximal to mid LAD stenosis.  No intervention was done.  He was noted to be back in rapid atrial fibrillation and was admitted.  He was diuresed and started on amiodarone, he converted back to NSR again spontaneously.    He underwent successful CTO procedure in 8/18 with DES to LCx, DES to pLAD, DES to dLAD.    In 10/18, he had atrial fibrillation ablation.  Now off amiodarone.   Cardiac MRI in 12/18 showed EF up to 44%.  Echo in 12/19 showed EF 40%. Echo in 1/21 showed EF 35-40% with diffuse hypokinesis and normal RV.   Patient had a presyncopal episode at home 10/30/20.  He was lightheaded but did not pass out.  On his pulse ox, HR was down to 30.  He went to the ER, HR was in 60s in the ER.  He was sent home.  Echo in 1/22 showed EF 35-40% range with global hypokinesis, mild LVH, mildly decreased RV systolic function.  Cardiac MRI was done again to more closely quantify EF (?ICD), showed LV EF 51%, RVEF 48%, small area of anteroseptal LGE c/w prior MI.   He returns for followup of CHF.  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  He gets tired/sleepy during the day.  Minimal exertional dyspnea, able to get out in the woods to hunt with his grandsons, no problems climbing up to the blind.  No chest pain.  No lightheadedness though BP tends to run on the  low side.   ECG (10/22, personally reviewed): NSR, 1st degree AVB  Labs (4/18): K 4.5, creatinine 1.0 Labs (5/18): K 4.2, creatinine 1.25, LFTs normal, TSH normal Labs (6/18): LDL 14, HDL 28 Labs (8/18): K 4.1 => 3.9, creatinine 1.0 => 1.14, LFTs normal, TSH normal Labs (10/18): hgb 13.8, K 4.4, creatinine 1.02 Labs (1/19): hgb 13.6, TSH normal, K 4.6, creatinine 1.05 Labs (02/11/2018): K 4.2 Creatinine 0.99 Labs (9/19): K 4.6, creatinine 0.99, hgb 13.6, LDL 31, HDL 28 Labs (2/20): K 4.5, creatinine 0.86 Labs (7/20): K 4.4, creatinine 1.33 Labs (1/21): K 4.4, creatinine 1.04, LDL 40 Labs (4/21): K 4.5, creatinine 0.99 Labs (1/22): K 4.2, creatinine 0.99, LDL 51, HDL 27 Labs (4/22): K 4.4, creatinine 0.91  PMH: 1. Atrial fibrillation: Paroxysmal.  2. 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  - Echo (2/18) with mild LV dilation, EF < 20%, severe mitral regurgitation.  - RHC/LHC (3/18): long 80% proximal-mid LAD stenosis, total occlusion of the LCx with left to left collaterals, RCA ok. Mean RA 7, PA 32/10, mean PCWP 16, LVEDP 12, CI 1.54.  - Echo (6/18) with EF 30-35%, diffuse hypokinesis, normal RV size and systolic function, trivial MR.  - Echo (12/18):  EF 35%, diffuse hypokinesis, normal RV size with mildly decreased systolic function.  - Cardiac MRI (12/18): EF 44%, small area of LGE in mid anteroseptal wall suggestive of prior infarction. - Echo (12/19): EF 40%, diffuse hypokinesis, normal RV size with mildly decreased systolic function.    - Echo (6/64): EF 35-40%, diffuse hypokinesis, normal RV size and systolic function.  - Echo (1/22): EF 35-40% range with global hypokinesis, mild LVH, mildly decreased RV systolic function. - Cardiac MRI (3/22): LV EF 51%, RVEF 48%, small area of anteroseptal LGE c/w prior MI.  3. CAD: LHC (3/18) with long 80% proximal-mid LAD stenosis, total occlusion  of the LCx with left to left collaterals, RCA ok. - 8/18: CTO intervention with DES to LCx and DES x 2 to proximal and mid LAD.  4. Mitral regurgitation: Severe on 2/18 echo.  Suspect functional, as MR only trivial on 6/18 echo.  5. Sleep study (6/18) with no OSA but nocturnal hypoxemia.  He was instructed to start 2 liters oxygen at night but his insurance would not cover.   6. Allergic rhinitis 7. PVCs - Zio patch 12/19: 1.1% PVCs, 4 runs NSVT, longest 10 beats.  - Zio patch 2/22: 6% PVCs, 13 runs NSVT, longest 15 beats 8. Rheumatoid arthritis  SH: Bus driver and school custodian, lives in [place], quit smoking in 2011, married.   FH: Grandfather with MIs, mother with CABG and CHF.   Review of systems complete and found to be negative unless listed in HPI.    Current Outpatient Medications  Medication Sig Dispense Refill   acetaminophen (TYLENOL) 500 MG tablet Take 1,000 mg by mouth every 6 (six) hours as needed.     diphenhydrAMINE (BENADRYL) 25 MG tablet Take 25 mg by mouth at bedtime as needed.     finasteride (PROSCAR) 5 MG tablet Take 1 tablet (5 mg total) by mouth daily. 90 tablet 0   furosemide (LASIX) 20 MG tablet Take 1 tablet (20 mg total) by mouth daily as needed. 90 tablet 3   nitroGLYCERIN (NITRODUR - DOSED IN MG/24 HR) 0.1 mg/hr patch Place 1 patch (0.1 mg total) onto the skin daily. 30 patch 5   sacubitril-valsartan (ENTRESTO) 97-103 MG Take 1 tablet by mouth 2 (two) times daily. 180 tablet 3   spironolactone (ALDACTONE) 25 MG tablet TAKE 1 TABLET AT BEDTIME 90 tablet 3   VITAMIN D, CHOLECALCIFEROL, PO Take 1 tablet by mouth daily.     atorvastatin (LIPITOR) 80 MG tablet TAKE 1 TABLET DAILY AT 6 P.M. 90 tablet 3   ELIQUIS 5 MG TABS tablet TAKE 1 TABLET TWICE A DAY 180 tablet 3   JARDIANCE 10 MG TABS tablet TAKE 1 TABLET DAILY BEFORE BREAKFAST 90 tablet 3   metoprolol succinate (TOPROL-XL) 50 MG 24 hr tablet Take 2 tablets (100 mg total) by mouth in the morning AND 1  tablet (50 mg total) daily after supper. Take with or immediately following a meal.. 270 tablet 3   No current facility-administered medications for this encounter.   BP 90/60   Pulse 66   Wt 133.5 kg (294 lb 6.4 oz)   SpO2 95%   BMI 35.84 kg/m    Wt Readings from Last 3 Encounters:  08/16/21 133.5 kg (294 lb 6.4 oz)  08/11/21 133.4 kg (294 lb 3.2 oz)  03/23/21 130.4 kg (287 lb 6.4 oz)    General: NAD Neck: Thick, no JVD, no thyromegaly or thyroid nodule.  Lungs: Clear to auscultation bilaterally with normal  respiratory effort. CV: Nondisplaced PMI.  Heart regular S1/S2, no S3/S4, no murmur.  No peripheral edema.  No carotid bruit.  Normal pedal pulses.  Abdomen: Soft, nontender, no hepatosplenomegaly, no distention.  Skin: Intact without lesions or rashes.  Neurologic: Alert and oriented x 3.  Psych: Normal affect. Extremities: No clubbing or cyanosis.  HEENT: Normal.   Assessment/Plan: 1. Atrial fibrillation: Paroxysmal.  Now s/p atrial fibrillation ablation in 10/18.  NSR today.  He is now off amiodarone.  - Continue Eliquis for anticoagulation.   2. 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  Echo in 2/18 with EF < 20%.  EF 30-35% in 6/18 and 35% on repeat echo 12/18. Cardiac MRI in 12/18 to determine need for ICD showed EF 44%. Echo in 1/21 showed EF 35-40%.  Echo in 1/22 with EF about 35-40%.  Cardiac MRI in 3/22 showed LV EF up to 51%.  NYHA class I-II symptoms.  He is not volume overloaded on exam.   - Continue Toprol XL 100 qam/50 qpm.   No BP room to increase.  - Continue Entresto 97/103 bid.   - Continue spironolactone 25 daily. BMET today.  - Continue empagliflozin.  - Continue to use Lasix only prn.  - I will arrange for repeat echo at followup.  3. CAD: s/p DES to LCx and LAD in 05/2017.  No chest pain.  - No ASA given stable CAD and use of Eliquis.  - Continue statin.  Check lipids today.  4. Mitral regurgitation: Minimal on 1/22 echo.    5. PVCs: 6% on 2/22 Zio patch with 13 short runs NSVT.  Minimally symptomatic.  - Continue Toprol XL. Would not restart amiodarone.   6. OSA: Strongly suspect.  I will arrange for home sleep study.   Followup in 3 months with echo.    Loralie Champagne 08/16/2021

## 2021-08-20 ENCOUNTER — Encounter (INDEPENDENT_AMBULATORY_CARE_PROVIDER_SITE_OTHER): Admitting: Cardiology

## 2021-08-20 DIAGNOSIS — G4733 Obstructive sleep apnea (adult) (pediatric): Secondary | ICD-10-CM

## 2021-08-29 ENCOUNTER — Ambulatory Visit

## 2021-08-29 DIAGNOSIS — I5022 Chronic systolic (congestive) heart failure: Secondary | ICD-10-CM

## 2021-08-29 NOTE — Procedures (Signed)
   Sleep Study Report  Patient Information Study Date: 08/20/21 Patient Name: Kevin Oconnell Patient ID: [id number] Birth Date:06/29/1956 Age:65 Gender: Male Referring Physician: Marca Ancona, MD  TEST DESCRIPTION: Home sleep apnea testing was completed using the WatchPat, a Type 1 device, utilizing peripheral arterial tonometry (PAT), chest movement, actigraphy, pulse oximetry, pulse rate, body position and snore. AHI was calculated with apnea and hypopnea using valid sleep time as the denominator. RDI includes apneas, hypopneas, and RERAs. The data acquired and the scoring of sleep and all associated events were performed in accordance with the recommended standards and specifications as outlined in the AASM Manual for the Scoring of Sleep and Associated Events 2.2.0 (2015).  FINDINGS: 1. Mild Obstructive Sleep Apnea with AHI 9.4/hr. 2. No Central Sleep Apnea with pAHIc 0.8/hr. 3. Oxygen desaturations as low as 83%. 4. Mild snoring was present. O2 sats were < 88% for 10.1 min. 5. Total sleep time was 8 hrs and 25 min. 6. 7.7% of total sleep time was spent in REM sleep. 7. Normal sleep onset latency at 16 min. 8. Prolonged REM sleep onset latency at 166 min. 9. Total awakenings were 16.  DIAGNOSIS: Mild Obstructive Sleep Apnea (G47.33)  RECOMMENDATIONS: 1. Clinical correlation of these findings is necessary. The decision to treat obstructive sleep apnea (OSA) is usually based on the presence of apnea symptoms or the presence of associated medical conditions such as Hypertension, Congestive Heart Failure, Atrial Fibrillation or Obesity. The most common symptoms of OSA are snoring, gasping for breath while sleeping, daytime sleepiness and fatigue.  2. Initiating apnea therapy is recommended given the presence of symptoms and/or associated conditions. Recommend proceeding with one of the following:   a. Auto-CPAP therapy with a pressure range of 5-20cm H2O.   b. An oral  appliance (OA) that can be obtained from certain dentists with expertise in sleep medicine. These are primarily of use in non-obese patients with mild and moderate disease.   c. An ENT consultation which may be useful to look for specific causes of obstruction and possible treatment options.   d. If patient is intolerant to PAP therapy, consider referral to ENT for evaluation for hypoglossal nerve stimulator.  3. Close follow-up is necessary to ensure success with CPAP or oral appliance therapy for maximum benefit .  4. A follow-up oximetry study on CPAP is recommended to assess the adequacy of therapy and determine the need for supplemental oxygen or the potential need for Bi-level therapy. An arterial blood gas to determine the adequacy of baseline ventilation and oxygenation should also be considered.  5. Healthy sleep recommendations include: adequate nightly sleep (normal 7-9 hrs/night), avoidance of caffeine after noon and alcohol near bedtime, and maintaining a sleep environment that is cool, dark and quiet.  6. Weight loss for overweight patients is recommended. Even modest amounts of weight loss can significantly improve the severity of sleep apnea.  7. Snoring recommendations include: weight loss where appropriate, side sleeping, and avoidance of alcohol before bed.  8. Operation of motor vehicle should be avoided when sleepy.  Signature: Electronically Signed: 08/29/21 Armanda Magic, MD; [facility]; Diplomat, American Board of Sleep Medicine

## 2021-09-01 ENCOUNTER — Telehealth: Payer: Self-pay | Admitting: *Deleted

## 2021-09-01 DIAGNOSIS — G4733 Obstructive sleep apnea (adult) (pediatric): Secondary | ICD-10-CM

## 2021-09-01 NOTE — Telephone Encounter (Signed)
-----   Message from Quintella Reichert, MD sent at 08/29/2021  9:22 AM EST ----- Please let patient know that they have sleep apnea and recommend treating with CPAP.  Please order an auto CPAP from 4-15cm H2O with heated humidity and mask of choice.  Order overnight pulse ox on CPAP.  Followup with me in 6 weeks.

## 2021-09-01 NOTE — Telephone Encounter (Signed)
The patient has been notified of the result and verbalized understanding.  All questions (if any) were answered. Latrelle Dodrill, CMA 09/01/2021 12:02 PM    Upon patient request DME selection is [facility] Patient understands he will be contacted by [facility] to set up his cpap. Patient understands to call if [facility] does not contact him with new setup in a timely manner. Patient understands they will be called once confirmation has been received from [facility] that they have received their new machine to schedule 10 week follow up appointment.   [facility] notified of new cpap order  Please add to airview Patient was grateful for the call and thanked me.

## 2021-09-02 ENCOUNTER — Ambulatory Visit

## 2021-09-06 ENCOUNTER — Telehealth: Payer: Self-pay | Admitting: Pharmacist

## 2021-09-06 NOTE — Telephone Encounter (Signed)
Patient referred by Dr. Shirlee Latch for [facility] for weight loss. PA for [facility] denied by [employer]. They want patient to try phentermine, Qsymia, Xenical and Contrave. These are all not good medications for patient. I advised insurance on PA that these were contraindicated but PA was denied.  According to [employer] obesity rep- [employer] will be making Wegovy more accessible in 2023. Instead of appealing, will wait for 2023 to resubmit. I called pt to discuss. LVM for him to call back.

## 2021-09-07 NOTE — Telephone Encounter (Signed)
Spoke with patient about above. He is in agreement to wait until Jan 1 to resubmit PA to [employer].

## 2021-10-20 ENCOUNTER — Telehealth: Payer: Self-pay | Admitting: Pharmacist

## 2021-10-21 ENCOUNTER — Other Ambulatory Visit: Payer: Self-pay | Admitting: Cardiology

## 2021-10-21 MED ORDER — SEMAGLUTIDE-WEIGHT MANAGEMENT 0.25 MG/0.5ML ~~LOC~~ SOAJ: 0.2500 mg | SUBCUTANEOUS | 0 refills | Status: DC

## 2021-10-21 MED ORDER — SEMAGLUTIDE-WEIGHT MANAGEMENT 0.5 MG/0.5ML ~~LOC~~ SOAJ: 0.5000 mg | SUBCUTANEOUS | 0 refills | Status: DC

## 2021-10-21 MED ORDER — SEMAGLUTIDE-WEIGHT MANAGEMENT 1.7 MG/0.75ML ~~LOC~~ SOAJ: 1.7000 mg | SUBCUTANEOUS | 0 refills | Status: DC

## 2021-10-21 MED ORDER — SEMAGLUTIDE-WEIGHT MANAGEMENT 1 MG/0.5ML ~~LOC~~ SOAJ: 1.0000 mg | SUBCUTANEOUS | 0 refills | Status: DC

## 2021-10-21 MED ORDER — SEMAGLUTIDE-WEIGHT MANAGEMENT 2.4 MG/0.75ML ~~LOC~~ SOAJ: 2.4000 mg | SUBCUTANEOUS | 11 refills | Status: DC

## 2021-10-21 NOTE — Telephone Encounter (Signed)
[facility] approved. Called pt. Still interested in therapy. Scheduled in clinic on 1/23.  [facility]

## 2021-11-07 ENCOUNTER — Ambulatory Visit (INDEPENDENT_AMBULATORY_CARE_PROVIDER_SITE_OTHER): Admitting: Pharmacist

## 2021-11-07 ENCOUNTER — Other Ambulatory Visit: Payer: Self-pay
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
## 2021-11-07 NOTE — Patient Instructions (Addendum)
Call me if you have any questions [phone]  GLP-1 Receptor Agonist Counseling Points This medication reduces your appetite and may make you feel fuller longer.  Stop eating when your body tells you that you are full. This will likely happen sooner than you are used to. Store your medication in the fridge until you are ready to use it. Inject your medication in the fatty tissue of your lower abdominal area (2 inches away from belly button) or upper outer thigh. Rotate injection sites. Common side effects include: nausea, diarrhea/constipation, and heartburn, and are more likely to occur if you overeat.  Dosing schedule: - Month 1: Inject 025 once weekly - Month 2: Inject 0.5 once weekly - Month 3: Inject 1 once weekly - Month 4: Inject 1.7 once weekly  Tips for living a healthier life     Building a Healthy and Balanced Diet Make most of your meal vegetables and fruits -  of your plate. Aim for color and variety, and remember that potatoes dont count as vegetables on the Healthy Eating Plate because of their negative impact on blood sugar.  Go for whole grains -  of your plate. Whole and intact grains--whole wheat, barley, wheat berries, quinoa, oats, brown rice, and foods made with them, such as whole wheat pasta--have a milder effect on blood sugar and insulin than white bread, white rice, and other refined grains.  Protein power -  of your plate. Fish, poultry, beans, and nuts are all healthy, versatile protein sources--they can be mixed into salads, and pair well with vegetables on a plate. Limit red meat, and avoid processed meats such as bacon and sausage.  Healthy plant oils - in moderation. Choose healthy vegetable oils like olive, canola, soy, corn, sunflower, peanut, and others, and avoid partially hydrogenated oils, which contain unhealthy trans fats. Remember that low-fat does not mean healthy.  Drink water, coffee, or tea. Skip sugary drinks, limit milk and  dairy products to one to two servings per day, and limit juice to a small glass per day.  Stay active. The red figure running across the Healthy Eating Plates placemat is a reminder that staying active is also important in weight control.  The main message of the Healthy Eating Plate is to focus on diet quality:  The type of carbohydrate in the diet is more important than the amount of carbohydrate in the diet, because some sources of carbohydrate--like vegetables (other than potatoes), fruits, whole grains, and beans--are healthier than others. The Healthy Eating Plate also advises consumers to avoid sugary beverages, a major source of calories--usually with little nutritional value--in the American diet. The Healthy Eating Plate encourages consumers to use healthy oils, and it does not set a maximum on the percentage of calories people should get each day from healthy sources of fat. In this way, the Healthy Eating Plate recommends the opposite of the low-fat message promoted for decades by the USDA.  [employer]  SUGAR  Sugar is a huge problem in the modern day diet. Sugar is a big contributor to heart disease, diabetes, high triglyceride levels, fatty liver disease and obesity. Sugar is hidden in almost all packaged foods/beverages. Added sugar is extra sugar that is added beyond what is naturally found and has no nutritional benefit for your body. The American Heart Association recommends limiting added sugars to no more than 25g for women and 36 grams for men per day. There are many names for sugar including maltose, sucrose (names ending in "ose"), high fructose  corn syrup, molasses, cane sugar, corn sweetener, raw sugar, syrup, honey or fruit juice concentrate.   One of the best ways to limit your added sugars is to stop drinking sweetened beverages such as soda, sweet tea, and fruit juice.  There is 65g of added sugars in one 20oz  bottle of Coke! That is equal to 7.5 donuts.   Pay attention and read all nutrition facts labels. Below is an examples of a nutrition facts label. The #1 is showing you the total sugars where the # 2 is showing you the added sugars. This one serving has almost the max amount of added sugars per day!     20 oz Soda 65g Sugar = 7.5 Glazed Donuts  16oz Energy  Drink 54g Sugar = 6.5 Glazed Donuts  Large Sweet  Tea 38g Sugar = 4 Glazed Donuts  20oz Sports  Drink 34g Sugar = 3.5 Glazed Donuts  8oz Chocolate Milk 24g Sugar =2.5 Glazed Donuts  8oz Orange  Juice 21g Sugar = 2 Glazed Donuts  1 Juice Box 14g Sugar = 1.5 Glazed Donuts  16oz Water= NO SUGAR!!  EXERCISE  Exercise is good. Weve all heard that. In an ideal world, we would all have time and resources to get plenty of it. When you are active, your heart pumps more efficiently and you will feel better.  Multiple studies show that even walking regularly has benefits that include living a longer life. The American Heart Association recommends 150 minutes per week of exercise (30 minutes per day most days of the week). You can do this in any increment you wish. Nine or more 10-minute walks count. So does an hour-long exercise class. Break the time apart into what will work in your life. Some of the best things you can do include walking briskly, jogging, cycling or swimming laps. Not everyone is ready to exercise. Sometimes we need to start with just getting active. Here are some easy ways to be more active throughout the day:  Take the stairs instead of the elevator  Go for a 10-15 minute walk during your lunch break (find a friend to make it more enjoyable)  When shopping, park at the back of the parking lot  If you take public transportation, get off one stop early and walk the extra distance  Pace around while making phone calls  Check with your doctor if you arent sure what your limitations may be. Always remember to  drink plenty of water when doing any type of exercise. Dont feel like a failure if youre not getting the 90-150 minutes per week. If you started by being a couch potato, then just a 10-minute walk each day is a huge improvement. Start with little victories and work your way up.   HEALTHY EATING TIPS  When looking to improve your eating habits, whether to lose weight, lower blood pressure or just be healthier, it helps to know what a serving size is.   Grains 1 slice of bread,  bagel,  cup pasta or rice  Vegetables 1 cup fresh or raw vegetables,  cup cooked or canned Fruits 1 piece of medium sized fruit,  cup canned,   Meats/Proteins  cup dried       1 oz meat, 1 egg,  cup cooked beans, nuts or seeds  Dairy        Fats Individual yogurt container, 1 cup (8oz)    1 teaspoon margarine/butter or vegetable  milk or milk alternative, 1 slice of  cheese          oil; 1 tablespoon mayonnaise or salad dressing                  Plan ahead: make a menu of the meals for a week then create a grocery list to go with that menu. Consider meals that easily stretch into a night of leftovers, such as stews or casseroles. Or consider making two of your favorite meal and put one in the freezer for another night. Try a night or two each week that is meatless or no cook such as salads. When you get home from the grocery store wash and prepare your vegetables and fruits. Then when you need them they are ready to go.   Tips for going to the grocery store:  Buy store or generic brands  Check the weekly ad from your store on-line or in their in-store flyer  Look at the unit price on the shelf tag to compare/contrast the costs of different items  Buy fruits/vegetables in season  Carrots, bananas and apples are low-cost, naturally healthy items  If meats or frozen vegetables are on sale, buy some extras and put in your freezer  Limit buying prepared or ready to eat items, even if they are pre-made salads  or fruit snacks  Do not shop when youre hungry  Foods at eye level tend to be more expensive. Look on the high and low shelves for deals.  Consider shopping at the farmers market for fresh foods in season.  Avoid the cookie and chip aisles (these are expensive, high in calories and low in nutritional value). Shop on the outside of the grocery store.  Healthy food preparations:  If you cant get lean hamburger, be sure to drain the fat when cooking  Steam, saut (in olive oil), grill or bake foods  Experiment with different seasonings to avoid adding salt to your foods. Kosher salt, sea salt and Himalayan salt are all still salt and should be avoided. Try seasoning food with onion, garlic, thyme, rosemary, basil ect. Onion powder or garlic powder is ok. Avoid if it says salt (ie garlic salt).

## 2021-11-08 LAB — HEMOGLOBIN A1C
Est. average glucose Bld gHb Est-mCnc: 123 mg/dL
Hgb A1c MFr Bld: 5.9 % — ABNORMAL HIGH (ref 4.8–5.6)
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
## 2021-11-11 ENCOUNTER — Encounter: Payer: Self-pay | Admitting: Cardiology

## 2021-11-11 ENCOUNTER — Telehealth: Payer: Self-pay | Admitting: *Deleted

## 2021-11-11 ENCOUNTER — Other Ambulatory Visit: Payer: Self-pay

## 2021-11-11 ENCOUNTER — Telehealth (INDEPENDENT_AMBULATORY_CARE_PROVIDER_SITE_OTHER): Admitting: Cardiology

## 2021-11-11 VITALS — BP 100/64 | HR 73 | Ht 76.0 in | Wt 290.0 lb

## 2021-11-11 DIAGNOSIS — G4733 Obstructive sleep apnea (adult) (pediatric): Secondary | ICD-10-CM | POA: Diagnosis not present

## 2021-11-11 NOTE — Telephone Encounter (Signed)
Per dr Mayford Knife, please let him know that AHI looks good but need to be more compliant  1 year followup

## 2021-11-11 NOTE — Telephone Encounter (Signed)
Patient understands his AHI showed normal. Pt is aware and agreeable to normal results and improving in compliance. 1 year follow up made.

## 2021-11-11 NOTE — Patient Instructions (Signed)

## 2021-11-16 ENCOUNTER — Other Ambulatory Visit: Payer: Self-pay

## 2021-11-16 ENCOUNTER — Ambulatory Visit (HOSPITAL_COMMUNITY)
Admission: RE | Admit: 2021-11-16 | Discharge: 2021-11-16 | Disposition: A | Discharge status: Home / Self Care | Source: Ambulatory Visit | Attending: Family Medicine | Admitting: Family Medicine

## 2021-11-16 ENCOUNTER — Ambulatory Visit (HOSPITAL_BASED_OUTPATIENT_CLINIC_OR_DEPARTMENT_OTHER)
Admission: RE | Admit: 2021-11-16 | Discharge: 2021-11-16 | Disposition: A | Discharge status: Home / Self Care | Source: Ambulatory Visit | Attending: Cardiology | Admitting: Cardiology

## 2021-11-16 ENCOUNTER — Encounter (HOSPITAL_COMMUNITY): Payer: Self-pay | Admitting: Cardiology

## 2021-11-16 VITALS — BP 90/50 | HR 87 | Wt 295.2 lb

## 2021-11-16 DIAGNOSIS — I480 Paroxysmal atrial fibrillation: Secondary | ICD-10-CM | POA: Insufficient documentation

## 2021-11-16 DIAGNOSIS — I472 Ventricular tachycardia, unspecified: Secondary | ICD-10-CM | POA: Diagnosis not present

## 2021-11-16 DIAGNOSIS — Z955 Presence of coronary angioplasty implant and graft: Secondary | ICD-10-CM | POA: Insufficient documentation

## 2021-11-16 DIAGNOSIS — I5022 Chronic systolic (congestive) heart failure: Secondary | ICD-10-CM | POA: Diagnosis not present

## 2021-11-16 DIAGNOSIS — Z79899 Other long term (current) drug therapy: Secondary | ICD-10-CM | POA: Diagnosis not present

## 2021-11-16 DIAGNOSIS — I340 Nonrheumatic mitral (valve) insufficiency: Secondary | ICD-10-CM | POA: Diagnosis not present

## 2021-11-16 DIAGNOSIS — I493 Ventricular premature depolarization: Secondary | ICD-10-CM | POA: Diagnosis not present

## 2021-11-16 DIAGNOSIS — G4733 Obstructive sleep apnea (adult) (pediatric): Secondary | ICD-10-CM | POA: Diagnosis not present

## 2021-11-16 DIAGNOSIS — K259 Gastric ulcer, unspecified as acute or chronic, without hemorrhage or perforation: Secondary | ICD-10-CM

## 2021-11-16 DIAGNOSIS — I2510 Atherosclerotic heart disease of native coronary artery without angina pectoris: Secondary | ICD-10-CM | POA: Diagnosis not present

## 2021-11-16 DIAGNOSIS — Z8711 Personal history of peptic ulcer disease: Secondary | ICD-10-CM | POA: Diagnosis not present

## 2021-11-16 DIAGNOSIS — Z7901 Long term (current) use of anticoagulants: Secondary | ICD-10-CM | POA: Diagnosis not present

## 2021-11-16 LAB — ECHOCARDIOGRAM COMPLETE
Area-P 1/2: 2.6 cm2
Calc EF: 45.4 %
S' Lateral: 3.8 cm
Single Plane A2C EF: 46.8 %
Single Plane A4C EF: 46.5 %

## 2021-11-16 LAB — BASIC METABOLIC PANEL
Anion gap: 11 (ref 5–15)
BUN: 18 mg/dL (ref 8–23)
CO2: 23 mmol/L (ref 22–32)
Calcium: 9.7 mg/dL (ref 8.9–10.3)
Chloride: 105 mmol/L (ref 98–111)
Creatinine, Ser: 0.99 mg/dL (ref 0.61–1.24)
GFR, Estimated: >60 mL/min (ref >60)
Glucose, Bld: 100 mg/dL — ABNORMAL HIGH (ref 70–99)
Potassium: 4.5 mmol/L (ref 3.5–5.1)
Sodium: 139 mmol/L (ref 135–145)

## 2021-11-16 LAB — CBC
HCT: 49.2 % (ref 39.0–52.0)
Hemoglobin: 16.8 g/dL (ref 13.0–17.0)
MCH: 29.9 pg (ref 26.0–34.0)
MCHC: 34.1 g/dL (ref 30.0–36.0)
MCV: 87.7 fL (ref 80.0–100.0)
Platelets: 317 10*3/uL (ref 150–400)
RBC: 5.61 MIL/uL (ref 4.22–5.81)
RDW: 13.2 % (ref 11.5–15.5)
WBC: 8.2 10*3/uL (ref 4.0–10.5)
nRBC: 0 % (ref 0.0–0.2)

## 2021-11-16 LAB — BRAIN NATRIURETIC PEPTIDE: B Natriuretic Peptide: 11.9 pg/mL (ref 0.0–100.0)

## 2021-11-16 MED ORDER — PANTOPRAZOLE SODIUM 40 MG PO TBEC: 40.0000 mg | DELAYED_RELEASE_TABLET | Freq: Every day | ORAL | 3 refills | Status: DC

## 2021-11-16 MED ORDER — PANTOPRAZOLE SODIUM 40 MG PO TBEC: 40.0000 mg | DELAYED_RELEASE_TABLET | Freq: Every day | ORAL | 0 refills | Status: DC

## 2021-11-16 NOTE — Patient Instructions (Addendum)
EKG done today.  Labs done today. We will contact you only if your labs are abnormal.  START Protonix 40mg  (1 tablet) by mouth daily.   No other medication changes were made. Please continue all current medications as prescribed.  You have been referred to [facility]. They will contact you to schedule an appointment.   Your physician recommends that you schedule a follow-up appointment in: 4 months with our NP/PA Clinic here in our office.    If you have any questions or concerns before your next appointment please send [facility] a message through [place] or call our office at [phone].    TO LEAVE A MESSAGE FOR THE NURSE SELECT OPTION 2, PLEASE LEAVE A MESSAGE INCLUDING: YOUR NAME DATE OF BIRTH CALL BACK NUMBER REASON FOR CALL**this is important as we prioritize the call backs  YOU WILL RECEIVE A CALL BACK THE SAME DAY AS LONG AS YOU CALL BEFORE 4:00 PM   Do the following things EVERYDAY: Weigh yourself in the morning before breakfast. Write it down and keep it in a log. Take your medicines as prescribed Eat low salt foods--Limit salt (sodium) to 2000 mg per day.  Stay as active as you can everyday Limit all fluids for the day to less than 2 liters   At the [facility], you and your health needs are our priority. As part of our continuing mission to provide you with exceptional heart care, we have created designated Provider Care Teams. These Care Teams include your primary Cardiologist (physician) and Advanced Practice Providers (APPs- Physician Assistants and Nurse Practitioners) who all work together to provide you with the care you need, when you need it.   You may see any of the following providers on your designated Care Team at your next follow up: Dr [phone] Dr Arvilla Meres, NP Carron Curie, Robbie Lis [place], PharmD   Please be sure to bring in all your medications bottles to every appointment.

## 2021-11-16 NOTE — Progress Notes (Signed)
PCP: Janora Norlander, DO Cardiology: Dr. Harl Bowie HF Cardiology: Dr. Aundra Dubin  66 y.o.with history of paroxysmal atrial fibrillation, CAD, and chronic systolic CHF was referred by Dr. Harl Bowie for CHF clinic evaluation.    Patient had no cardiac history prior to 2/18.  In 2/18, he developed the onset of exertional dyspnea. After steadily worsening dyspnea, he went to the ER at [facility] and was admitted.  Echo in 2/18 showed EF < 20% with severe MR.  He went back into NSR spontaneously.  He followed up with Dr. Harl Bowie and was set up for a cath in 3/18.  He was found to have a chronically occluded LCx and a long proximal to mid LAD stenosis.  No intervention was done.  He was noted to be back in rapid atrial fibrillation and was admitted.  He was diuresed and started on amiodarone, he converted back to NSR again spontaneously.    He underwent successful CTO procedure in 8/18 with DES to LCx, DES to pLAD, DES to dLAD.    In 10/18, he had atrial fibrillation ablation.  Now off amiodarone.   Cardiac MRI in 12/18 showed EF up to 44%.  Echo in 12/19 showed EF 40%. Echo in 1/21 showed EF 35-40% with diffuse hypokinesis and normal RV.   Patient had a presyncopal episode at home 10/30/20.  He was lightheaded but did not pass out.  On his pulse ox, HR was down to 30.  He went to the ER, HR was in 60s in the ER.  He was sent home.  Echo in 1/22 showed EF 35-40% range with global hypokinesis, mild LVH, mildly decreased RV systolic function.  Cardiac MRI was done again to more closely quantify EF (?ICD), showed LV EF 51%, RVEF 48%, small area of anteroseptal LGE c/w prior MI.   Echo was done today and reviewed, EF 45%, mild LVH, mildly decreased RV systolic function.   He returns for followup of CHF.  He is working part-time now driving a school bus.  He is using CPAP but struggles with it.  He has just started semaglutide for weight loss.  He has been having epigastric burning after eating/drinking.  This  reminds him of a prior gastric ulcer that he had.  No dyspnea on flat ground.  No orthopnea/PND.  No chest pain.  He has been having more joint pain recently that he attributes to his rheumatoid arthritis. Weight stable.   ECG (personally reviewed): NSR, right superior axis  Labs (4/18): K 4.5, creatinine 1.0 Labs (5/18): K 4.2, creatinine 1.25, LFTs normal, TSH normal Labs (6/18): LDL 14, HDL 28 Labs (8/18): K 4.1 => 3.9, creatinine 1.0 => 1.14, LFTs normal, TSH normal Labs (10/18): hgb 13.8, K 4.4, creatinine 1.02 Labs (1/19): hgb 13.6, TSH normal, K 4.6, creatinine 1.05 Labs (02/11/2018): K 4.2 Creatinine 0.99 Labs (9/19): K 4.6, creatinine 0.99, hgb 13.6, LDL 31, HDL 28 Labs (2/20): K 4.5, creatinine 0.86 Labs (7/20): K 4.4, creatinine 1.33 Labs (1/21): K 4.4, creatinine 1.04, LDL 40 Labs (4/21): K 4.5, creatinine 0.99 Labs (1/22): K 4.2, creatinine 0.99, LDL 51, HDL 27 Labs (4/22): K 4.4, creatinine 0.91 Labs (11/22): K 4.9, creatinine 0.98, LDL 25, TGs 164  PMH: 1. Atrial fibrillation: Paroxysmal.  2. 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  - Echo (2/18) with mild LV dilation, EF < 20%, severe mitral regurgitation.  - RHC/LHC (3/18): long 80% proximal-mid LAD stenosis,  total occlusion of the LCx with left to left collaterals, RCA ok. Mean RA 7, PA 32/10, mean PCWP 16, LVEDP 12, CI 1.54.  - Echo (6/18) with EF 30-35%, diffuse hypokinesis, normal RV size and systolic function, trivial MR.  - Echo (12/18): EF 35%, diffuse hypokinesis, normal RV size with mildly decreased systolic function.  - Cardiac MRI (12/18): EF 44%, small area of LGE in mid anteroseptal wall suggestive of prior infarction. - Echo (12/19): EF 40%, diffuse hypokinesis, normal RV size with mildly decreased systolic function.    - Echo (1/21): EF 35-40%, diffuse hypokinesis, normal RV size and systolic function.  - Echo  (1/22): EF 35-40% range with global hypokinesis, mild LVH, mildly decreased RV systolic function. - Cardiac MRI (3/22): LV EF 51%, RVEF 48%, small area of anteroseptal LGE c/w prior MI.  - Echo (1/23): EF 45%, mild LVH, mildly decreased RV systolic function.  3. CAD: LHC (3/18) with long 80% proximal-mid LAD stenosis, total occlusion of the LCx with left to left collaterals, RCA ok. - 8/18: CTO intervention with DES to LCx and DES x 2 to proximal and mid LAD.  4. Mitral regurgitation: Severe on 2/18 echo.  Suspect functional, as MR only trivial on 6/18 echo.  5. Sleep study (6/18) with no OSA but nocturnal hypoxemia.  He was instructed to start 2 liters oxygen at night but his insurance would not cover.   6. Allergic rhinitis 7. PVCs - Zio patch 12/19: 1.1% PVCs, 4 runs NSVT, longest 10 beats.  - Zio patch 2/22: 6% PVCs, 13 runs NSVT, longest 15 beats 8. Rheumatoid arthritis 9. OSA  SH: Recruitment consultant and school custodian, lives in [place], quit smoking in 2011, married.   FH: Grandfather with MIs, mother with CABG and CHF.   Review of systems complete and found to be negative unless listed in HPI.    Current Outpatient Medications  Medication Sig Dispense Refill   acetaminophen (TYLENOL) 500 MG tablet Take 1,000 mg by mouth every 6 (six) hours as needed.     atorvastatin (LIPITOR) 80 MG tablet TAKE 1 TABLET DAILY AT 6 P.M. 90 tablet 3   diphenhydrAMINE (BENADRYL) 25 MG tablet Take 25 mg by mouth at bedtime as needed (sinus pressure).     ELIQUIS 5 MG TABS tablet TAKE 1 TABLET TWICE A DAY 180 tablet 3   finasteride (PROSCAR) 5 MG tablet Take 1 tablet (5 mg total) by mouth daily. 90 tablet 0   furosemide (LASIX) 20 MG tablet Take 1 tablet (20 mg total) by mouth daily as needed. 90 tablet 3   JARDIANCE 10 MG TABS tablet TAKE 1 TABLET DAILY BEFORE BREAKFAST 90 tablet 3   metoprolol succinate (TOPROL-XL) 50 MG 24 hr tablet Take 2 tablets (100 mg total) by mouth in the morning AND 1 tablet (50  mg total) daily after supper. Take with or immediately following a meal.. 270 tablet 3   nitroGLYCERIN (NITRODUR - DOSED IN MG/24 HR) 0.1 mg/hr patch Place 1 patch (0.1 mg total) onto the skin daily. 30 patch 5   nitroGLYCERIN (NITROSTAT) 0.4 MG SL tablet Place 0.4 mg under the tongue every 5 (five) minutes as needed for chest pain.     pantoprazole (PROTONIX) 40 MG tablet Take 1 tablet (40 mg total) by mouth daily. 90 tablet 3   pantoprazole (PROTONIX) 40 MG tablet Take 1 tablet (40 mg total) by mouth daily. 30 tablet 0   sacubitril-valsartan (ENTRESTO) 97-103 MG Take 1 tablet by mouth 2 (  two) times daily. 180 tablet 3   Semaglutide-Weight Management 0.25 MG/0.5ML SOAJ Inject 0.25 mg into the skin once a week. 2 mL 0   spironolactone (ALDACTONE) 25 MG tablet TAKE 1 TABLET AT BEDTIME 90 tablet 3   VITAMIN D, CHOLECALCIFEROL, PO Take 1 tablet by mouth every other day.     [START ON 11/19/2021] Semaglutide-Weight Management 0.5 MG/0.5ML SOAJ Inject 0.5 mg into the skin once a week for 28 days. (Patient not taking: Reported on 11/11/2021) 2 mL 0   [START ON 12/18/2021] Semaglutide-Weight Management 1 MG/0.5ML SOAJ Inject 1 mg into the skin once a week. (Patient not taking: Reported on 11/11/2021) 2 mL 0   [START ON 01/16/2022] Semaglutide-Weight Management 1.7 MG/0.75ML SOAJ Inject 1.7 mg into the skin once a week. (Patient not taking: Reported on 11/11/2021) 3 mL 0   [START ON 02/14/2022] Semaglutide-Weight Management 2.4 MG/0.75ML SOAJ Inject 2.4 mg into the skin once a week. (Patient not taking: Reported on 11/11/2021) 3 mL 11   No current facility-administered medications for this encounter.   BP (!) 90/50    Pulse 87    Wt 133.9 kg (295 lb 3.2 oz)    SpO2 94%    BMI 35.93 kg/m    Wt Readings from Last 3 Encounters:  11/16/21 133.9 kg (295 lb 3.2 oz)  11/11/21 131.5 kg (290 lb)  11/07/21 136 kg (299 lb 12.8 oz)    General: NAD Neck: Thick. No JVD, no thyromegaly or thyroid nodule.  Lungs: Clear to  auscultation bilaterally with normal respiratory effort. CV: Nondisplaced PMI.  Heart regular S1/S2, no S3/S4, no murmur.  Trace ankle edema.  No carotid bruit.  Normal pedal pulses.  Abdomen: Soft, nontender, no hepatosplenomegaly, no distention.  Skin: Intact without lesions or rashes.  Neurologic: Alert and oriented x 3.  Psych: Normal affect. Extremities: No clubbing or cyanosis.  HEENT: Normal.   Assessment/Plan: 1. Atrial fibrillation: Paroxysmal.  Now s/p atrial fibrillation ablation in 10/18.  NSR today.  He is now off amiodarone.  - Continue Eliquis for anticoagulation. CBC today.   2. 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  Echo in 2/18 with EF < 20%.  EF 30-35% in 6/18 and 35% on repeat echo 12/18. Cardiac MRI in 12/18 to determine need for ICD showed EF 44%. Echo in 1/21 showed EF 35-40%.  Echo in 1/22 with EF about 35-40%.  Cardiac MRI in 3/22 showed LV EF up to 51%.  Echo today with EF 45%.  NYHA class I-II symptoms.  He is not volume overloaded on exam.  - Continue Toprol XL 100 qam/50 qpm.   No BP room to increase.  - Continue Entresto 97/103 bid.   - Continue spironolactone 25 daily. BMET today.  - Continue empagliflozin.  - Continue to use Lasix only prn.  3. CAD: s/p DES to LCx and LAD in 05/2017.  No chest pain.  - No ASA given stable CAD and use of Eliquis.  - Continue statin. Good lipids in 11/22.  4. Mitral regurgitation: Minimal on 1/22 echo and on echo today.  5. PVCs: 6% on 2/22 Zio patch with 13 short runs NSVT.  Minimally symptomatic.  - Continue Toprol XL. Would not restart amiodarone.   6. OSA: Having difficulty with CPAP.  Will ask Dr. Radford Pax to reassess.  7. PUD: Patient has a history of gastric ulcer per his report, now getting epigastric burning with meals.  -  Start pantoprazole 40 mg daily.  - I will refer to GI, he will likely need upper endoscopy.    Followup in 3 months with APP.    Loralie Champagne 11/16/2021

## 2021-11-17 ENCOUNTER — Encounter: Payer: Self-pay | Admitting: Internal Medicine

## 2021-11-23 ENCOUNTER — Encounter: Payer: Self-pay | Admitting: Pharmacist

## 2021-12-07 ENCOUNTER — Telehealth: Payer: Self-pay | Admitting: Pharmacist

## 2021-12-07 NOTE — Telephone Encounter (Addendum)
Called patient to see how he was doing on Wegovy. He reports that he had 2 weeks nausea but is doing better. He has lost 17 lb. States he is eating normal, just smaller portions and less frequent. 2-3 meals a day. He does have a hx of stomach ulcers. Has burning in stomach prior to starting. Is seeing GI soon. States sugar really messes with his stomach so he has been avoiding this. Has not been exercising but will start walking with his wife soon. He is comfortable increasing dose. Turns out [facility] didn't send 0.25mg  dose and he started on the 0.5mg  which would explain why he has such bad nausea. He will increase to 1mg  on Sunday. He will call if he has any issues. I will follow up with him via telephone in 1 month.

## 2021-12-08 ENCOUNTER — Encounter: Payer: Self-pay | Admitting: Internal Medicine

## 2021-12-08 ENCOUNTER — Ambulatory Visit (INDEPENDENT_AMBULATORY_CARE_PROVIDER_SITE_OTHER): Admitting: Internal Medicine

## 2021-12-08 ENCOUNTER — Telehealth: Payer: Self-pay

## 2021-12-08 ENCOUNTER — Other Ambulatory Visit (INDEPENDENT_AMBULATORY_CARE_PROVIDER_SITE_OTHER)

## 2021-12-08 VITALS — HR 80 | Ht 76.0 in | Wt 287.0 lb

## 2021-12-08 DIAGNOSIS — R140 Abdominal distension (gaseous): Secondary | ICD-10-CM

## 2021-12-08 DIAGNOSIS — K219 Gastro-esophageal reflux disease without esophagitis: Secondary | ICD-10-CM

## 2021-12-08 DIAGNOSIS — Z86010 Personal history of colonic polyps: Secondary | ICD-10-CM

## 2021-12-08 DIAGNOSIS — Z87898 Personal history of other specified conditions: Secondary | ICD-10-CM

## 2021-12-08 LAB — CORTISOL: Cortisol, Plasma: 8.5 ug/dL

## 2021-12-08 LAB — TSH: TSH: 1.62 u[IU]/mL (ref 0.35–5.50)

## 2021-12-08 MED ORDER — PANTOPRAZOLE SODIUM 40 MG PO TBEC: 40.0000 mg | DELAYED_RELEASE_TABLET | Freq: Two times a day (BID) | ORAL | 3 refills | Status: DC

## 2021-12-08 NOTE — Patient Instructions (Signed)
If you are age 64 or younger, your body mass index should be between 19-25. Your Body mass index is 34.93 kg/m. If this is out of the aformentioned range listed, please consider follow up with your Primary Care Provider.  ________________________________________________________  The [facility] providers would like to encourage you to use [facility] to communicate with providers for non-urgent requests or questions.  Due to long hold times on the telephone, sending your provider a message by [facility] may be a faster and more efficient way to get a response.  Please allow 48 business hours for a response.  Please remember that this is for non-urgent requests.  _______________________________________________________  Your provider has requested that you go to the basement level for lab work before leaving today. Press "B" on the elevator. The lab is located at the first door on the left as you exit the elevator.  We have sent the following medications to your pharmacy for you to pick up at your convenience:  INCREASE: Pantoprazole to 40mg  one tablet 30 minutes prior to breakfast and dinner meal each day.  You have been scheduled for an endoscopy and colonoscopy. Please follow the written instructions given to you at your visit today. Please pick up your prep supplies at the pharmacy within the next 1-3 days. If you use inhalers (even only as needed), please bring them with you on the day of your procedure.  You will be contacted by our office prior to your procedure for directions on holding your Eliquis.  If you do not hear from our office 1 week prior to your scheduled procedure, please call [phone] to discuss.   Due to recent changes in healthcare laws, you may see the results of your imaging and laboratory studies on MyChart before your provider has had a chance to review them.  We understand that in some cases there may be results that are confusing or concerning to you. Not all laboratory  results come back in the same time frame and the provider may be waiting for multiple results in order to interpret others.  Please give [phone] 48 hours in order for your provider to thoroughly review all the results before contacting the office for clarification of your results.   You have been scheduled for an abdominal ultrasound at [facility] (1st floor of hospital) on 12-21-21 at 8am. Please arrive 30 minutes prior to your appointment for registration. Make certain not to have anything to eat or drink after midnight prior to your appointment. Should you need to reschedule your appointment, please contact radiology at [phone]. This test typically takes about 30 minutes to perform.  Thank you for entrusting me with your care and choosing [facility].  Dr [facility]
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
## 2021-12-08 NOTE — Telephone Encounter (Signed)
[facility] Pre-operative Risk Assessment     Request for surgical clearance:     Endoscopy Procedure  What type of surgery is being performed?     Endo/Colon  When is this surgery scheduled?     01-24-22  What type of clearance is required ?   Pharmacy  Are there any medications that need to be held prior to surgery and how long? Eliquis x 2 days  Practice name and name of physician performing surgery?      [facility]  What is your office phone and fax number?      Phone- [phone]  [phone]  Anesthesia type (None, local, MAC, general) ?       MAC

## 2021-12-09 NOTE — Telephone Encounter (Signed)
Patient with diagnosis of A Fib on Eliquis for anticoagulation.    Procedure: Endo/Colon Date of procedure: 01/24/22   CHA2DS2-VASc Score = 2  This indicates a 2.2% annual risk of stroke. The patient's score is based upon: CHF History: 1 HTN History: 0 Diabetes History: 0 Stroke History: 0 Vascular Disease History: 1 Age Score: 0 Gender Score: 0   CrCl 113 mL/min using adjusted body weight Platelet count 317K   Per office protocol, patient can hold Eliquis for 2 days prior to procedure.

## 2021-12-09 NOTE — Telephone Encounter (Addendum)
° °  Primary Cardiologist: Kevin Champagne, MD  Chart reviewed as part of pre-operative protocol coverage. Given past medical history and time since last visit, based on ACC/AHA guidelines, Kevin Oconnell would be at acceptable risk for the planned procedure without further cardiovascular testing.   Patient with diagnosis of A Fib on Eliquis for anticoagulation.     Procedure: Endo/Colon Date of procedure: 01/24/22     CHA2DS2-VASc Score = 2  This indicates a 2.2% annual risk of stroke. The patient's score is based upon: CHF History: 1 HTN History: 0 Diabetes History: 0 Stroke History: 0 Vascular Disease History: 1 Age Score: 0 Gender Score: 0     CrCl 113 mL/min using adjusted body weight Platelet count 317K     Per office protocol, patient can hold Eliquis for 2 days prior to procedure.  I will route this recommendation to the requesting party via Epic fax function and remove from pre-op pool.  Please call with questions.  Kevin Oconnell. Kevin Vaquerano NP-C    12/09/2021, 4:18 PM [facility] [address] Office [phone] Fax [phone]

## 2021-12-13 NOTE — Telephone Encounter (Signed)
Patient advised that he has been given clearance to hold Eliquis 2 days prior to endo/colon scheduled for 01-24-22.  Patient advised to take last dose of Eliquis on 01-21-22, and he will be advised when to restart Eliquis by Dr Leonides Schanz after the procedure.  Patient agreed to plan and verbalized understanding.  No further questions.

## 2021-12-18 ENCOUNTER — Other Ambulatory Visit (HOSPITAL_COMMUNITY): Payer: Self-pay | Admitting: Cardiology

## 2021-12-19 ENCOUNTER — Other Ambulatory Visit (HOSPITAL_COMMUNITY): Payer: Self-pay | Admitting: *Deleted

## 2021-12-19 MED ORDER — SPIRONOLACTONE 25 MG PO TABS: 25.0000 mg | ORAL_TABLET | Freq: Every day | ORAL | 3 refills | Status: DC

## 2021-12-21 ENCOUNTER — Ambulatory Visit (HOSPITAL_COMMUNITY)
Admission: RE | Admit: 2021-12-21 | Discharge: 2021-12-21 | Disposition: A | Discharge status: Home / Self Care | Source: Ambulatory Visit | Attending: Internal Medicine | Admitting: Internal Medicine

## 2021-12-21 ENCOUNTER — Other Ambulatory Visit: Payer: Self-pay

## 2021-12-21 DIAGNOSIS — K219 Gastro-esophageal reflux disease without esophagitis: Secondary | ICD-10-CM | POA: Insufficient documentation

## 2021-12-21 DIAGNOSIS — R140 Abdominal distension (gaseous): Secondary | ICD-10-CM | POA: Diagnosis not present

## 2021-12-21 DIAGNOSIS — Z87898 Personal history of other specified conditions: Secondary | ICD-10-CM | POA: Diagnosis present

## 2021-12-23 NOTE — Telephone Encounter (Signed)
Patient called stating that he does not think he can take [facility] anymore. He has nausea, vomiting daily, diarrhea, reflux. Interfering with his work.  ?Advised patient to stop medication. Focus on eating real food. Avoid highly processed foods, sugar. Eat plenty of vegetables and high fiber foods.  ?Patient was started on the wrong starting dose bc [facility] did not send him the 0.25mg  pen. However his symptoms did not improve.  ?Advised to stop. He can retry in the future if he would like- will make sure to start at 0.25mg . ?

## 2022-01-19 ENCOUNTER — Telehealth: Payer: Self-pay

## 2022-01-19 NOTE — Telephone Encounter (Signed)
At Dr. Derek Mound and anesthesia's request, called pt to inform about need to cancel [facility] procedure d/t difficult intubation. In addition, pt has been placed on July wait list for [facility] scheduling. Once July provider availability has been released, pt will receive a phone call to re-schedule procedure. Called pt to make him aware. Left detailed message re: [facility] cancellation. In addition, asked pt to return my call so that further rationale, anesthesia contact # and future plans can be provided. Will await pt returned call. ?

## 2022-01-19 NOTE — Telephone Encounter (Signed)
-----   Message from Sharyn Creamer, MD sent at 01/18/2022  4:27 PM EDT ----- ?Regarding: RE: [place] pt ?Hi Damante Spragg, see if the patient would still like to proceed with rescheduling his procedure. Let him know that our head anesthesia nurse reviewed his case and thinks that it would be safer for his procedure to be done in the hospital based upon the intubation that he had in 2018. ?----- Message ----- ?From: Osvaldo Angst, CRNA ?Sent: 01/18/2022   4:23 PM EDT ?To: Sharyn Creamer, MD ?Subject: RE: [place] pt                                    ? ?I have no idea. ? ?----- Message ----- ?From: Sharyn Creamer, MD ?Sent: 01/18/2022  10:26 AM EDT ?To: Osvaldo Angst, CRNA, Aleatha Borer, LPN ?Subject: RE: [place] pt                                    ? ?Not clear to me Sherman Donaldson. John, do you know? ?----- Message ----- ?From: Aleatha Borer, LPN ?Sent: 01/18/2022  10:16 AM EDT ?To: Osvaldo Angst, CRNA, Sharyn Creamer, MD ?Subject: RE: [place] pt                                    ? ?Is the pt aware of this previous "difficult intubation"? ?----- Message ----- ?From: Sharyn Creamer, MD ?Sent: 01/18/2022  10:12 AM EDT ?To: Osvaldo Angst, CRNA, Aleatha Borer, LPN ?Subject: FW: [facility] pt                                    ? ?Got it. Hi Dima Ferrufino, could we reschedule him for the hospital? Thanks. ?----- Message ----- ?From: Osvaldo Angst, CRNA ?Sent: 01/18/2022   8:38 AM EDT ?To: Sharyn Creamer, MD ?Subject: [place] pt                                        ? ?Dr. Lorenso Courier, ? ?This pt is scheduled with you on April 11.  He is a documented difficult intubation and his procedure will need to be done at the hospital.   ? ?Thanks, ? ?Osvaldo Angst ? ? ? ? ? ? ?

## 2022-01-19 NOTE — Telephone Encounter (Signed)
Pt returned call. Informed about reason for [facility] cancellation. Pt had several questions about what was seen that resulted in "difficult intubation". Provided with anesthesia's contact # so he can have his questions addressed appropriately. In addition, advised he has been placed on a wait list for July and that he will receive a call once provider's availability has been released. Verbalized acceptance and understanding. ?

## 2022-01-24 ENCOUNTER — Encounter: Admitting: Internal Medicine

## 2022-01-27 ENCOUNTER — Ambulatory Visit

## 2022-02-14 ENCOUNTER — Telehealth: Payer: Self-pay

## 2022-02-14 ENCOUNTER — Other Ambulatory Visit: Payer: Self-pay

## 2022-02-14 DIAGNOSIS — Z87898 Personal history of other specified conditions: Secondary | ICD-10-CM

## 2022-02-14 DIAGNOSIS — R112 Nausea with vomiting, unspecified: Secondary | ICD-10-CM

## 2022-02-14 DIAGNOSIS — K219 Gastro-esophageal reflux disease without esophagitis: Secondary | ICD-10-CM

## 2022-02-14 DIAGNOSIS — R140 Abdominal distension (gaseous): Secondary | ICD-10-CM

## 2022-02-14 DIAGNOSIS — Z860100 Personal history of colon polyps, unspecified: Secondary | ICD-10-CM

## 2022-02-14 NOTE — Telephone Encounter (Signed)
Pt scheduled for colonoscopy and EGD @ [facility] on 05/08/22 @ 730am, arrival time 6am. Amb referral placed for auth purposes. Prep instructions sent via My Chart. ?

## 2022-02-14 NOTE — Telephone Encounter (Signed)
Appears pt read My Chart message and responded as follows: ? ?Last read by [name] at 11:08 AM on 02/14/2022. ?Kevin Oconnell ?to Me   ?  11:09 AM ?Ok thank you ?

## 2022-03-14 NOTE — Progress Notes (Signed)
PCP: Janora Norlander, DO Cardiology: Dr. Harl Bowie HF Cardiology: Dr. Aundra Dubin  66 y.o.with history of paroxysmal atrial fibrillation, CAD, and chronic systolic CHF was referred by Dr. Harl Bowie for CHF clinic evaluation.    Patient had no cardiac history prior to 2/18.  In 2/18, he developed the onset of exertional dyspnea. After steadily worsening dyspnea, he went to the ER at [facility] and was admitted.  Echo in 2/18 showed EF < 20% with severe MR.  He went back into NSR spontaneously.  He followed up with Dr. Harl Bowie and was set up for a cath in 3/18.  He was found to have a chronically occluded LCx and a long proximal to mid LAD stenosis.  No intervention was done.  He was noted to be back in rapid atrial fibrillation and was admitted.  He was diuresed and started on amiodarone, he converted back to NSR again spontaneously.    He underwent successful CTO procedure in 8/18 with DES to LCx, DES to pLAD, DES to dLAD.    In 10/18, he had atrial fibrillation ablation.  Now off amiodarone.   Cardiac MRI in 12/18 showed EF up to 44%.  Echo in 12/19 showed EF 40%. Echo in 1/21 showed EF 35-40% with diffuse hypokinesis and normal RV.   Patient had a presyncopal episode at home 10/30/20.  He was lightheaded but did not pass out.  On his pulse ox, HR was down to 30.  He went to the ER, HR was in 60s in the ER.  He was sent home.  Echo in 1/22 showed EF 35-40% range with global hypokinesis, mild LVH, mildly decreased RV systolic function.  Cardiac MRI was done again to more closely quantify EF (?ICD), showed LV EF 51%, RVEF 48%, small area of anteroseptal LGE c/w prior MI.   Echo was done today and reviewed, EF 45%, mild LVH, mildly decreased RV systolic function.   He returns for followup of CHF.  He is working part-time now driving a school bus.  He is using CPAP but struggles with it.  He has just started semaglutide for weight loss.  He has been having epigastric burning after eating/drinking.  This  reminds him of a prior gastric ulcer that he had.  No dyspnea on flat ground.  No orthopnea/PND.  No chest pain.  He has been having more joint pain recently that he attributes to his rheumatoid arthritis. Weight stable.   ECG (personally reviewed): NSR, right superior axis  Labs (4/18): K 4.5, creatinine 1.0 Labs (5/18): K 4.2, creatinine 1.25, LFTs normal, TSH normal Labs (6/18): LDL 14, HDL 28 Labs (8/18): K 4.1 => 3.9, creatinine 1.0 => 1.14, LFTs normal, TSH normal Labs (10/18): hgb 13.8, K 4.4, creatinine 1.02 Labs (1/19): hgb 13.6, TSH normal, K 4.6, creatinine 1.05 Labs (02/11/2018): K 4.2 Creatinine 0.99 Labs (9/19): K 4.6, creatinine 0.99, hgb 13.6, LDL 31, HDL 28 Labs (2/20): K 4.5, creatinine 0.86 Labs (7/20): K 4.4, creatinine 1.33 Labs (1/21): K 4.4, creatinine 1.04, LDL 40 Labs (4/21): K 4.5, creatinine 0.99 Labs (1/22): K 4.2, creatinine 0.99, LDL 51, HDL 27 Labs (4/22): K 4.4, creatinine 0.91 Labs (11/22): K 4.9, creatinine 0.98, LDL 25, TGs 164  PMH: 1. Atrial fibrillation: Paroxysmal.  2. 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  - Echo (2/18) with mild LV dilation, EF < 20%, severe mitral regurgitation.  - RHC/LHC (3/18): long 80% proximal-mid LAD stenosis,  total occlusion of the LCx with left to left collaterals, RCA ok. Mean RA 7, PA 32/10, mean PCWP 16, LVEDP 12, CI 1.54.  - Echo (6/18) with EF 30-35%, diffuse hypokinesis, normal RV size and systolic function, trivial MR.  - Echo (12/18): EF 35%, diffuse hypokinesis, normal RV size with mildly decreased systolic function.  - Cardiac MRI (12/18): EF 44%, small area of LGE in mid anteroseptal wall suggestive of prior infarction. - Echo (12/19): EF 40%, diffuse hypokinesis, normal RV size with mildly decreased systolic function.    - Echo (1/21): EF 35-40%, diffuse hypokinesis, normal RV size and systolic function.  - Echo  (1/22): EF 35-40% range with global hypokinesis, mild LVH, mildly decreased RV systolic function. - Cardiac MRI (3/22): LV EF 51%, RVEF 48%, small area of anteroseptal LGE c/w prior MI.  - Echo (1/23): EF 45%, mild LVH, mildly decreased RV systolic function.  3. CAD: LHC (3/18) with long 80% proximal-mid LAD stenosis, total occlusion of the LCx with left to left collaterals, RCA ok. - 8/18: CTO intervention with DES to LCx and DES x 2 to proximal and mid LAD.  4. Mitral regurgitation: Severe on 2/18 echo.  Suspect functional, as MR only trivial on 6/18 echo.  5. Sleep study (6/18) with no OSA but nocturnal hypoxemia.  He was instructed to start 2 liters oxygen at night but his insurance would not cover.   6. Allergic rhinitis 7. PVCs - Zio patch 12/19: 1.1% PVCs, 4 runs NSVT, longest 10 beats.  - Zio patch 2/22: 6% PVCs, 13 runs NSVT, longest 15 beats 8. Rheumatoid arthritis 9. OSA  SH: Recruitment consultant and school custodian, lives in [place], quit smoking in 2011, married.   FH: Grandfather with MIs, mother with CABG and CHF.   Review of systems complete and found to be negative unless listed in HPI.    Current Outpatient Medications  Medication Sig Dispense Refill   acetaminophen (TYLENOL) 500 MG tablet Take 1,000 mg by mouth every 6 (six) hours as needed.     atorvastatin (LIPITOR) 80 MG tablet TAKE 1 TABLET DAILY AT 6 P.M. 90 tablet 3   diphenhydrAMINE (BENADRYL) 25 MG tablet Take 25 mg by mouth at bedtime as needed (sinus pressure).     ELIQUIS 5 MG TABS tablet TAKE 1 TABLET TWICE A DAY 180 tablet 3   ENTRESTO 97-103 MG TAKE 1 TABLET TWICE A DAY 180 tablet 3   finasteride (PROSCAR) 5 MG tablet Take 1 tablet (5 mg total) by mouth daily. 90 tablet 0   furosemide (LASIX) 20 MG tablet Take 1 tablet (20 mg total) by mouth daily as needed. 90 tablet 3   JARDIANCE 10 MG TABS tablet TAKE 1 TABLET DAILY BEFORE BREAKFAST 90 tablet 3   metoprolol succinate (TOPROL-XL) 50 MG 24 hr tablet Take 2  tablets (100 mg total) by mouth in the morning AND 1 tablet (50 mg total) daily after supper. Take with or immediately following a meal.. 270 tablet 3   nitroGLYCERIN (NITRODUR - DOSED IN MG/24 HR) 0.1 mg/hr patch Place 1 patch (0.1 mg total) onto the skin daily. 30 patch 5   nitroGLYCERIN (NITROSTAT) 0.4 MG SL tablet Place 0.4 mg under the tongue every 5 (five) minutes as needed for chest pain.     pantoprazole (PROTONIX) 40 MG tablet Take 1 tablet (40 mg total) by mouth 2 (two) times daily before a meal. Take 30 minutes prior to meals 180 tablet 3   Semaglutide-Weight Management 1  MG/0.5ML SOAJ Inject 1 mg into the skin once a week. 2 mL 0   Semaglutide-Weight Management 1.7 MG/0.75ML SOAJ Inject 1.7 mg into the skin once a week. 3 mL 0   Semaglutide-Weight Management 2.4 MG/0.75ML SOAJ Inject 2.4 mg into the skin once a week. 3 mL 11   spironolactone (ALDACTONE) 25 MG tablet Take 1 tablet (25 mg total) by mouth at bedtime. 90 tablet 3   VITAMIN D, CHOLECALCIFEROL, PO Take 1 tablet by mouth every other day.     No current facility-administered medications for this visit.   There were no vitals taken for this visit.   Wt Readings from Last 3 Encounters:  12/08/21 130.2 kg (287 lb)  11/16/21 133.9 kg (295 lb 3.2 oz)  11/11/21 131.5 kg (290 lb)    General: NAD Neck: Thick. No JVD, no thyromegaly or thyroid nodule.  Lungs: Clear to auscultation bilaterally with normal respiratory effort. CV: Nondisplaced PMI.  Heart regular S1/S2, no S3/S4, no murmur.  Trace ankle edema.  No carotid bruit.  Normal pedal pulses.  Abdomen: Soft, nontender, no hepatosplenomegaly, no distention.  Skin: Intact without lesions or rashes.  Neurologic: Alert and oriented x 3.  Psych: Normal affect. Extremities: No clubbing or cyanosis.  HEENT: Normal.   Assessment/Plan: 1. Atrial fibrillation: Paroxysmal.  Now s/p atrial fibrillation ablation in 10/18.  NSR today.  He is now off amiodarone.  - Continue Eliquis  for anticoagulation. CBC today.   2. 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  Echo in 2/18 with EF < 20%.  EF 30-35% in 6/18 and 35% on repeat echo 12/18. Cardiac MRI in 12/18 to determine need for ICD showed EF 44%. Echo in 1/21 showed EF 35-40%.  Echo in 1/22 with EF about 35-40%.  Cardiac MRI in 3/22 showed LV EF up to 51%.  Echo today with EF 45%.  NYHA class I-II symptoms.  He is not volume overloaded on exam.  - Continue Toprol XL 100 qam/50 qpm.   No BP room to increase.  - Continue Entresto 97/103 bid.   - Continue spironolactone 25 daily. BMET today.  - Continue empagliflozin.  - Continue to use Lasix only prn.  3. CAD: s/p DES to LCx and LAD in 05/2017.  No chest pain.  - No ASA given stable CAD and use of Eliquis.  - Continue statin. Good lipids in 11/22.  4. Mitral regurgitation: Minimal on 1/22 echo and on echo today.  5. PVCs: 6% on 2/22 Zio patch with 13 short runs NSVT.  Minimally symptomatic.  - Continue Toprol XL. Would not restart amiodarone.   6. OSA: Having difficulty with CPAP.  Will ask Dr. Radford Pax to reassess.  7. PUD: Patient has a history of gastric ulcer per his report, now getting epigastric burning with meals.  - Start pantoprazole 40 mg daily.  - I will refer to GI, he will likely need upper endoscopy.   Followup in 3 months with APP.    [place] 03/14/2022

## 2022-03-16 ENCOUNTER — Encounter (HOSPITAL_COMMUNITY): Payer: Self-pay

## 2022-03-16 ENCOUNTER — Ambulatory Visit (HOSPITAL_COMMUNITY)
Admission: RE | Admit: 2022-03-16 | Discharge: 2022-03-16 | Disposition: A | Discharge status: Home / Self Care | Source: Ambulatory Visit | Attending: Family Medicine | Admitting: Family Medicine

## 2022-03-16 VITALS — BP 108/68 | HR 51 | Ht 76.0 in | Wt 287.0 lb

## 2022-03-16 DIAGNOSIS — I252 Old myocardial infarction: Secondary | ICD-10-CM | POA: Insufficient documentation

## 2022-03-16 DIAGNOSIS — Z7901 Long term (current) use of anticoagulants: Secondary | ICD-10-CM | POA: Diagnosis not present

## 2022-03-16 DIAGNOSIS — Z955 Presence of coronary angioplasty implant and graft: Secondary | ICD-10-CM | POA: Insufficient documentation

## 2022-03-16 DIAGNOSIS — I2510 Atherosclerotic heart disease of native coronary artery without angina pectoris: Secondary | ICD-10-CM

## 2022-03-16 DIAGNOSIS — Z79899 Other long term (current) drug therapy: Secondary | ICD-10-CM | POA: Insufficient documentation

## 2022-03-16 DIAGNOSIS — E669 Obesity, unspecified: Secondary | ICD-10-CM

## 2022-03-16 DIAGNOSIS — I472 Ventricular tachycardia, unspecified: Secondary | ICD-10-CM | POA: Diagnosis not present

## 2022-03-16 DIAGNOSIS — I5022 Chronic systolic (congestive) heart failure: Secondary | ICD-10-CM | POA: Diagnosis not present

## 2022-03-16 DIAGNOSIS — I480 Paroxysmal atrial fibrillation: Secondary | ICD-10-CM | POA: Diagnosis not present

## 2022-03-16 DIAGNOSIS — I340 Nonrheumatic mitral (valve) insufficiency: Secondary | ICD-10-CM | POA: Diagnosis not present

## 2022-03-16 DIAGNOSIS — I493 Ventricular premature depolarization: Secondary | ICD-10-CM

## 2022-03-16 DIAGNOSIS — Z6834 Body mass index (BMI) 34.0-34.9, adult: Secondary | ICD-10-CM | POA: Diagnosis not present

## 2022-03-16 DIAGNOSIS — G4733 Obstructive sleep apnea (adult) (pediatric): Secondary | ICD-10-CM | POA: Diagnosis not present

## 2022-03-16 DIAGNOSIS — K259 Gastric ulcer, unspecified as acute or chronic, without hemorrhage or perforation: Secondary | ICD-10-CM

## 2022-03-16 LAB — BASIC METABOLIC PANEL
Anion gap: 9 (ref 5–15)
BUN: 12 mg/dL (ref 8–23)
CO2: 25 mmol/L (ref 22–32)
Calcium: 9.4 mg/dL (ref 8.9–10.3)
Chloride: 107 mmol/L (ref 98–111)
Creatinine, Ser: 1.04 mg/dL (ref 0.61–1.24)
GFR, Estimated: >60 mL/min (ref >60)
Glucose, Bld: 113 mg/dL — ABNORMAL HIGH (ref 70–99)
Potassium: 4.8 mmol/L (ref 3.5–5.1)
Sodium: 141 mmol/L (ref 135–145)

## 2022-03-16 NOTE — Patient Instructions (Signed)
It was great to see you today! No medication changes are needed at this time.  Labs today We will only contact you if something comes back abnormal or we need to make some changes. Otherwise no news is good news!   Your physician recommends that you schedule a follow-up appointment in: 3-4 months with Dr McLean   Do the following things EVERYDAY: Weigh yourself in the morning before breakfast. Write it down and keep it in a log. Take your medicines as prescribed Eat low salt foods--Limit salt (sodium) to 2000 mg per day.  Stay as active as you can everyday Limit all fluids for the day to less than 2 liters  At the [facility], you and your health needs are our priority. As part of our continuing mission to provide you with exceptional heart care, we have created designated Provider Care Teams. These Care Teams include your primary Cardiologist (physician) and Advanced Practice Providers (APPs- Physician Assistants and Nurse Practitioners) who all work together to provide you with the care you need, when you need it.   You may see any of the following providers on your designated Care Team at your next follow up: Dr Daniel Bensimhon Dr Dalton McLean Amy Clegg, NP Brittainy Simmons, PA Jessica Milford,NP Lindsay Finch, PA Lauren Kemp, PharmD   Please be sure to bring in all your medications bottles to every appointment.    

## 2022-03-17 ENCOUNTER — Telehealth (HOSPITAL_COMMUNITY): Payer: Self-pay | Admitting: *Deleted

## 2022-03-17 NOTE — Telephone Encounter (Signed)
Pt called concerned about HR, he states appt yesterday it was low (50) and since then he has been checking it at home on pulse ox, HR ranging mid 30s to 70s and jumping up and down. He states he feels fine, no dizziness/lightheadedness/palps. Advised EKG 6/1 SR with pvc's HR 71, advised pvcs can cause pulse ox to not accurately capture HR, advised if symptoms dev or he feels palps/pvcs to let us know, he is agreeable

## 2022-05-01 ENCOUNTER — Encounter (HOSPITAL_COMMUNITY): Payer: Self-pay | Admitting: Internal Medicine

## 2022-05-03 ENCOUNTER — Telehealth: Payer: Self-pay

## 2022-05-03 NOTE — Telephone Encounter (Signed)
Patient with diagnosis of afib on Eliquis for anticoagulation.    Procedure: colonoscopy Date of procedure: 05/08/22  CHA2DS2-VASc Score = 2   This indicates a 2.2% annual risk of stroke. The patient's score is based upon: CHF History: 1 HTN History: 0 Diabetes History: 0 Stroke History: 0 Vascular Disease History: 1 Age Score: 0 Gender Score: 0      CrCl 107 Platelet count 317  Per office protocol, patient can hold Eliquis for 2 days prior to procedure.    **This guidance is not considered finalized until pre-operative APP has relayed final recommendations.**

## 2022-05-03 NOTE — Telephone Encounter (Signed)
   Patient Name: Kevin Oconnell  DOB: 11/26/1955 MRN: [id number]  Primary Cardiologist: Marca Ancona, MD Clinical pharmacist have reviewed the patient's past medical history, labs, and current medications as part of pre-operative protocol coverage.   The following recommendations have been made:  Patient with diagnosis of afib on Eliquis for anticoagulation.     Procedure: colonoscopy Date of procedure: 05/08/22   CHA2DS2-VASc Score = 2   This indicates a 2.2% annual risk of stroke. The patient's score is based upon: CHF History: 1 HTN History: 0 Diabetes History: 0 Stroke History: 0 Vascular Disease History: 1 Age Score: 0 Gender Score: 0       CrCl 107 Platelet count 317   Per office protocol, patient can hold Eliquis for 2 days prior to procedure.   I will route this recommendation to the requesting party via Epic fax function and remove from pre-op pool.  Please call with questions.  Joylene Grapes, NP 05/03/2022, 3:23 PM

## 2022-05-03 NOTE — Telephone Encounter (Signed)
Patient advised that he has been given clearance to hold Eliquis 2 days prior to scheduled for 05-08-22.  Patient advised to take last dose of Eliquis on 05-05-22, and he will be advised when to restart Eliquis by Dr Leonides Schanz after the procedure.  Patient agreed to plan and verbalized understanding.  No further questions.

## 2022-05-03 NOTE — Telephone Encounter (Signed)
[facility] Pre-operative Risk Assessment     Request for surgical clearance:     Endoscopy Procedure  What type of surgery is being performed?     Colonoscopy  When is this surgery scheduled?     05-08-22  What type of clearance is required ?   Pharmacy  Are there any medications that need to be held prior to surgery and how long? Eliquis x 2 days  Practice name and name of physician performing surgery?      [facility]  What is your office phone and fax number?      Phone- [phone]  [phone]  Anesthesia type (None, local, MAC, general) ?       MAC

## 2022-05-08 ENCOUNTER — Encounter (HOSPITAL_COMMUNITY)
Admission: RE | Disposition: A | Discharge status: Home / Self Care | Payer: Self-pay | Source: Home / Self Care | Attending: Internal Medicine

## 2022-05-08 ENCOUNTER — Ambulatory Visit (HOSPITAL_COMMUNITY): Admitting: Anesthesiology

## 2022-05-08 ENCOUNTER — Ambulatory Visit (HOSPITAL_BASED_OUTPATIENT_CLINIC_OR_DEPARTMENT_OTHER): Admitting: Anesthesiology

## 2022-05-08 ENCOUNTER — Other Ambulatory Visit: Payer: Self-pay

## 2022-05-08 ENCOUNTER — Ambulatory Visit (HOSPITAL_COMMUNITY)
Admission: RE | Admit: 2022-05-08 | Discharge: 2022-05-08 | Disposition: A | Discharge status: Home / Self Care | Attending: Internal Medicine | Admitting: Internal Medicine

## 2022-05-08 ENCOUNTER — Encounter (HOSPITAL_COMMUNITY): Payer: Self-pay | Admitting: Internal Medicine

## 2022-05-08 DIAGNOSIS — K621 Rectal polyp: Secondary | ICD-10-CM | POA: Diagnosis not present

## 2022-05-08 DIAGNOSIS — R12 Heartburn: Secondary | ICD-10-CM | POA: Insufficient documentation

## 2022-05-08 DIAGNOSIS — K635 Polyp of colon: Secondary | ICD-10-CM

## 2022-05-08 DIAGNOSIS — Z1211 Encounter for screening for malignant neoplasm of colon: Secondary | ICD-10-CM | POA: Diagnosis present

## 2022-05-08 DIAGNOSIS — Z8711 Personal history of peptic ulcer disease: Secondary | ICD-10-CM | POA: Insufficient documentation

## 2022-05-08 DIAGNOSIS — Z8719 Personal history of other diseases of the digestive system: Secondary | ICD-10-CM | POA: Diagnosis not present

## 2022-05-08 DIAGNOSIS — K2970 Gastritis, unspecified, without bleeding: Secondary | ICD-10-CM | POA: Insufficient documentation

## 2022-05-08 DIAGNOSIS — K317 Polyp of stomach and duodenum: Secondary | ICD-10-CM | POA: Diagnosis not present

## 2022-05-08 DIAGNOSIS — I2510 Atherosclerotic heart disease of native coronary artery without angina pectoris: Secondary | ICD-10-CM | POA: Diagnosis not present

## 2022-05-08 DIAGNOSIS — Z86010 Personal history of colonic polyps: Secondary | ICD-10-CM

## 2022-05-08 DIAGNOSIS — K219 Gastro-esophageal reflux disease without esophagitis: Secondary | ICD-10-CM | POA: Insufficient documentation

## 2022-05-08 DIAGNOSIS — R140 Abdominal distension (gaseous): Secondary | ICD-10-CM | POA: Diagnosis not present

## 2022-05-08 DIAGNOSIS — R112 Nausea with vomiting, unspecified: Secondary | ICD-10-CM

## 2022-05-08 DIAGNOSIS — Z87898 Personal history of other specified conditions: Secondary | ICD-10-CM

## 2022-05-08 DIAGNOSIS — K648 Other hemorrhoids: Secondary | ICD-10-CM | POA: Insufficient documentation

## 2022-05-08 DIAGNOSIS — Z09 Encounter for follow-up examination after completed treatment for conditions other than malignant neoplasm: Secondary | ICD-10-CM

## 2022-05-08 DIAGNOSIS — I509 Heart failure, unspecified: Secondary | ICD-10-CM | POA: Diagnosis not present

## 2022-05-08 HISTORY — PX: COLONOSCOPY WITH PROPOFOL: SHX5780

## 2022-05-08 HISTORY — PX: POLYPECTOMY: SHX5525

## 2022-05-08 HISTORY — PX: BIOPSY: SHX5522

## 2022-05-08 HISTORY — PX: ESOPHAGOGASTRODUODENOSCOPY (EGD) WITH PROPOFOL: SHX5813

## 2022-05-08 SURGERY — COLONOSCOPY WITH PROPOFOL
Anesthesia: Monitor Anesthesia Care

## 2022-05-08 MED ORDER — LACTATED RINGERS IV SOLN
INTRAVENOUS | Status: AC | PRN
  Administered 2022-05-08: 1000 mL via INTRAVENOUS

## 2022-05-08 MED ORDER — PROPOFOL 500 MG/50ML IV EMUL
INTRAVENOUS | Status: DC | PRN
  Administered 2022-05-08: 100 ug/kg/min via INTRAVENOUS

## 2022-05-08 MED ORDER — LACTATED RINGERS IV SOLN: INTRAVENOUS | Status: DC

## 2022-05-08 MED ORDER — PHENYLEPHRINE 80 MCG/ML (10ML) SYRINGE FOR IV PUSH (FOR BLOOD PRESSURE SUPPORT)
PREFILLED_SYRINGE | INTRAVENOUS | Status: DC | PRN
  Administered 2022-05-08 (×2): 80 ug via INTRAVENOUS

## 2022-05-08 MED ORDER — PROPOFOL 1000 MG/100ML IV EMUL
INTRAVENOUS | Status: AC
  Filled 2022-05-08: qty 100

## 2022-05-08 MED ORDER — ONDANSETRON HCL 4 MG/2ML IJ SOLN
INTRAMUSCULAR | Status: DC | PRN
  Administered 2022-05-08: 4 mg via INTRAVENOUS

## 2022-05-08 MED ORDER — PROPOFOL 500 MG/50ML IV EMUL
INTRAVENOUS | Status: AC
  Filled 2022-05-08: qty 50

## 2022-05-08 MED ORDER — SODIUM CHLORIDE 0.9 % IV SOLN: INTRAVENOUS | Status: DC

## 2022-05-08 MED ORDER — PANTOPRAZOLE SODIUM 40 MG PO TBEC: 40.0000 mg | DELAYED_RELEASE_TABLET | Freq: Two times a day (BID) | ORAL | 1 refills | Status: DC

## 2022-05-08 MED ORDER — LIDOCAINE HCL (CARDIAC) PF 100 MG/5ML IV SOSY
PREFILLED_SYRINGE | INTRAVENOUS | Status: DC | PRN
  Administered 2022-05-08: 60 mg via INTRATRACHEAL

## 2022-05-08 MED ORDER — EPHEDRINE SULFATE (PRESSORS) 50 MG/ML IJ SOLN
INTRAMUSCULAR | Status: DC | PRN
  Administered 2022-05-08: 5 mg via INTRAVENOUS

## 2022-05-08 SURGICAL SUPPLY — 25 items

## 2022-05-08 NOTE — Transfer of Care (Signed)
Immediate Anesthesia Transfer of Care Note  Patient: Kevin Oconnell  Procedure(s) Performed: Procedure(s): COLONOSCOPY WITH PROPOFOL (N/A) ESOPHAGOGASTRODUODENOSCOPY (EGD) WITH PROPOFOL (N/A) BIOPSY POLYPECTOMY  Patient Location: PACU  Anesthesia Type:MAC  Level of Consciousness:  sedated, patient cooperative and responds to stimulation  Airway & Oxygen Therapy:Patient Spontanous Breathing and Patient connected to face mask oxgen  Post-op Assessment:  Report given to PACU RN and Post -op Vital signs reviewed and stable  Post vital signs:  Reviewed and stable  Last Vitals:  Vitals:   05/08/22 0650  BP: (!) 106/59  Pulse: 93  Resp: 17  Temp: 36.5 C  SpO2: 364%    Complications: No apparent anesthesia complications

## 2022-05-08 NOTE — Anesthesia Preprocedure Evaluation (Signed)
Anesthesia Evaluation  Patient identified by MRN, date of birth, ID band Patient awake    Reviewed: Allergy & Precautions, NPO status , Patient's Chart, lab work & pertinent test results  History of Anesthesia Complications Negative for: history of anesthetic complications  Airway Mallampati: IV  TM Distance: <3 FB Neck ROM: Limited    Dental  (+) Teeth Intact, Dental Advisory Given   Pulmonary neg shortness of breath, asthma , sleep apnea , neg recent URI, former smoker,    breath sounds clear to auscultation       Cardiovascular + CAD, + Cardiac Stents and +CHF   Rhythm:Regular  1. Left ventricular ejection fraction, by estimation, is 45%. The left  ventricle has mildly decreased function. The left ventricle demonstrates  global hypokinesis. There is mild left ventricular hypertrophy. Left  ventricular diastolic parameters are  consistent with Grade I diastolic dysfunction (impaired relaxation).  2. Right ventricular systolic function is mildly reduced. The right  ventricular size is normal. Tricuspid regurgitation signal is inadequate  for assessing PA pressure.  3. Right atrial size was mildly dilated.  4. The mitral valve is normal in structure. No evidence of mitral valve  regurgitation. No evidence of mitral stenosis.  5. The aortic valve is tricuspid. Aortic valve regurgitation is not  visualized. No aortic stenosis is present.  6. Aortic dilatation noted. There is mild dilatation of the aortic root,  measuring 38 mm.  7. The inferior vena cava is normal in size with greater than 50%  respiratory variability, suggesting right atrial pressure of 3 mmHg.    Neuro/Psych  Headaches,  Neuromuscular disease negative psych ROS   GI/Hepatic Neg liver ROS, N/V; PUD, h/o colon polyps   Endo/Other  negative endocrine ROS  Renal/GU negative Renal ROS     Musculoskeletal  (+) Arthritis ,   Abdominal   Peds   Hematology   Anesthesia Other Findings   Reproductive/Obstetrics                             Anesthesia Physical Anesthesia Plan  ASA: 3  Anesthesia Plan: MAC   Post-op Pain Management: Minimal or no pain anticipated   Induction: Intravenous  PONV Risk Score and Plan: 1 and Propofol infusion and Treatment may vary due to age or medical condition  Airway Management Planned: Nasal Cannula, Natural Airway and Simple Face Mask  Additional Equipment: None  Intra-op Plan:   Post-operative Plan:   Informed Consent: I have reviewed the patients History and Physical, chart, labs and discussed the procedure including the risks, benefits and alternatives for the proposed anesthesia with the patient or authorized representative who has indicated his/her understanding and acceptance.     Dental advisory given  Plan Discussed with: CRNA  Anesthesia Plan Comments:         Anesthesia Quick Evaluation

## 2022-05-08 NOTE — Op Note (Addendum)
[facility] Patient Name: Kevin Oconnell Procedure Date: 05/08/2022 MRN: [id number] Attending MD: Particia Lather ,  Date of Birth: 1956-08-22 CSN: [id number] Age: 66 Admit Type: Outpatient Procedure:                Upper GI endoscopy Indications:              Heartburn, Abdominal bloating, Nausea with vomiting Providers:                Madelyn Brunner" Carlos Levering, RN, Champ Mungo, Technician Referring MD:             Rozell Searing. Gottschalk Medicines:                Monitored Anesthesia Care Complications:            No immediate complications. Estimated Blood Loss:     Estimated blood loss was minimal. Procedure:                Pre-Anesthesia Assessment:                           - Prior to the procedure, a History and Physical                            was performed, and patient medications and                            allergies were reviewed. The patient's tolerance of                            previous anesthesia was also reviewed. The risks                            and benefits of the procedure and the sedation                            options and risks were discussed with the patient.                            All questions were answered, and informed consent                            was obtained. Prior Anticoagulants: The patient has                            taken Eliquis (apixaban), last dose was 3 days                            prior to procedure. ASA Grade Assessment: III - A                            patient with severe systemic disease. After  reviewing the risks and benefits, the patient was                            deemed in satisfactory condition to undergo the                            procedure.                           After obtaining informed consent, the endoscope was                            passed under direct vision. Throughout the                             procedure, the patient's blood pressure, pulse, and                            oxygen saturations were monitored continuously. The                            [id number]-H190 ([id number] Olympus endoscope was introduced                            through the mouth, and advanced to the second part                            of duodenum. The upper GI endoscopy was                            accomplished without difficulty. The patient                            tolerated the procedure well. Scope In: Scope Out: Findings:      The examined esophagus was normal.      Localized inflammation characterized by congestion (edema), erosions and       erythema was found in the gastric antrum. Biopsies were taken with a       cold forceps for histology.      One 14 mm pedunculated polyp (suspected lipoma) with no bleeding was       found in the second portion of the duodenum. Biopsies were taken with a       cold forceps for histology.      Biopsies were taken with a cold forceps in the duodenal bulb and in the       second portion of the duodenum for histology. Impression:               - Normal esophagus.                           - Gastritis. Biopsied.                           - One duodenal polyp (suspected lipoma). Biopsied.                           -  Biopsies were taken with a cold forceps for                            histology in the duodenal bulb and in the second                            portion of the duodenum. Moderate Sedation:      Not Applicable - Patient had care per Anesthesia. Recommendation:           - Use Protonix (pantoprazole) 40 mg PO BID for 8                            weeks.                           - No ibuprofen, naproxen, or other non-steroidal                            anti-inflammatory drugs.                           - Await pathology results.                           - Perform a colonoscopy today. Procedure Code(s):        --- Professional ---                            [phone], Esophagogastroduodenoscopy, flexible,                            transoral; with biopsy, single or multiple Diagnosis Code(s):        --- Professional ---                           K29.70, Gastritis, unspecified, without bleeding                           K31.7, Polyp of stomach and duodenum                           R12, Heartburn                           R14.0, Abdominal distension (gaseous)                           R11.2, Nausea with vomiting, unspecified CPT copyright 2019 American Medical Association. All rights reserved. The codes documented in this report are preliminary and upon coder review may  be revised to meet current compliance requirements. Kevin Oconnell "Kevin Oconnell,  05/08/2022 8:51:51 AM Number of Addenda: 0

## 2022-05-08 NOTE — Discharge Instructions (Signed)
YOU HAD AN ENDOSCOPIC PROCEDURE TODAY: Refer to the procedure report and other information in the discharge instructions given to you for any specific questions about what was found during the examination. If this information does not answer your questions, please call [place] office at [phone] to clarify.  ° °YOU SHOULD EXPECT: Some feelings of bloating in the abdomen. Passage of more gas than usual. Walking can help get rid of the air that was put into your GI tract during the procedure and reduce the bloating. If you had a lower endoscopy (such as a colonoscopy or flexible sigmoidoscopy) you may notice spotting of blood in your stool or on the toilet paper. Some abdominal soreness may be present for a day or two, also. ° °DIET: Your first meal following the procedure should be a light meal and then it is ok to progress to your normal diet. A half-sandwich or bowl of soup is an example of a good first meal. Heavy or fried foods are harder to digest and may make you feel nauseous or bloated. Drink plenty of fluids but you should avoid alcoholic beverages for 24 hours. If you had a esophageal dilation, please see attached instructions for diet.   ° °ACTIVITY: Your care partner should take you home directly after the procedure. You should plan to take it easy, moving slowly for the rest of the day. You can resume normal activity the day after the procedure however YOU SHOULD NOT DRIVE, use power tools, machinery or perform tasks that involve climbing or major physical exertion for 24 hours (because of the sedation medicines used during the test).  ° °SYMPTOMS TO REPORT IMMEDIATELY: °A gastroenterologist can be reached at any hour. Please call [phone]  for any of the following symptoms:  °Following lower endoscopy (colonoscopy, flexible sigmoidoscopy) °Excessive amounts of blood in the stool  °Significant tenderness, worsening of abdominal pains  °Swelling of the abdomen that is new, acute  °Fever of 100° or  higher  °Following upper endoscopy (EGD, EUS, ERCP, esophageal dilation) °Vomiting of blood or coffee ground material  °New, significant abdominal pain  °New, significant chest pain or pain under the shoulder blades  °Painful or persistently difficult swallowing  °New shortness of breath  °Black, tarry-looking or red, bloody stools ° °FOLLOW UP:  °If any biopsies were taken you will be contacted by phone or by letter within the next 1-3 weeks. Call [phone]  if you have not heard about the biopsies in 3 weeks.  °Please also call with any specific questions about appointments or follow up tests. ° °
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
## 2022-05-08 NOTE — Op Note (Signed)
[facility] Patient Name: Kevin Oconnell Procedure Date: 05/08/2022 MRN: [id number] Attending MD: Particia Lather ,  Date of Birth: 08/29/1956 CSN: [id number] Age: 66 Admit Type: Outpatient Procedure:                Colonoscopy Indications:              High risk colon cancer surveillance: Personal                            history of colonic polyps Providers:                Madelyn Brunner" Carlos Levering, RN, Champ Mungo, Technician Referring MD:             Rozell Searing. Gottschalk Medicines:                Monitored Anesthesia Care Complications:            No immediate complications. Estimated Blood Loss:     Estimated blood loss was minimal. Procedure:                Pre-Anesthesia Assessment:                           - Prior to the procedure, a History and Physical                            was performed, and patient medications and                            allergies were reviewed. The patient's tolerance of                            previous anesthesia was also reviewed. The risks                            and benefits of the procedure and the sedation                            options and risks were discussed with the patient.                            All questions were answered, and informed consent                            was obtained. Prior Anticoagulants: The patient has                            taken Eliquis (apixaban), last dose was 3 days                            prior to procedure. ASA Grade Assessment: III - A  patient with severe systemic disease. After                            reviewing the risks and benefits, the patient was                            deemed in satisfactory condition to undergo the                            procedure.                           After obtaining informed consent, the colonoscope                            was passed under direct  vision. Throughout the                            procedure, the patient's blood pressure, pulse, and                            oxygen saturations were monitored continuously. The                            [id number] ([id number] Olympus colonoscope was                            introduced through the anus and advanced to the the                            terminal ileum. The colonoscopy was performed                            without difficulty. The patient tolerated the                            procedure well. The quality of the bowel                            preparation was adequate. The terminal ileum,                            ileocecal valve, appendiceal orifice, and rectum                            were photographed. Scope In: 8:06:22 AM Scope Out: 8:39:35 AM Scope Withdrawal Time: 0 hours 27 minutes 39 seconds  Total Procedure Duration: 0 hours 33 minutes 13 seconds  Findings:      The terminal ileum appeared normal.      Six sessile polyps were found in the ascending colon and cecum. The       polyps were 3 to 6 mm in size. These polyps were removed with a cold       snare. Resection and retrieval were complete.      Three sessile polyps were found in  the transverse colon. The polyps were       3 to 5 mm in size. These polyps were removed with a cold snare.       Resection and retrieval were complete.      Three sessile polyps were found in the rectum and sigmoid colon. The       polyps were 2 to 3 mm in size. These polyps were removed with a cold       snare. Resection and retrieval were complete.      Non-bleeding internal hemorrhoids were found during retroflexion. Impression:               - The examined portion of the ileum was normal.                           - Six 3 to 6 mm polyps in the ascending colon and                            in the cecum, removed with a cold snare. Resected                            and retrieved.                           - Three 3 to  5 mm polyps in the transverse colon,                            removed with a cold snare. Resected and retrieved.                           - Three 2 to 3 mm polyps in the rectum and in the                            sigmoid colon, removed with a cold snare. Resected                            and retrieved.                           - Non-bleeding internal hemorrhoids. Moderate Sedation:      Not Applicable - Patient had care per Anesthesia. Recommendation:           - Discharge patient to home (with escort).                           - Await pathology results.                           - Okay to restart Eliquis tomorrow.                           - The findings and recommendations were discussed                            with the patient.                           -  Return to GI clinic in 6 weeks. Procedure Code(s):        --- Professional ---                           [phone], Colonoscopy, flexible; with removal of                            tumor(s), polyp(s), or other lesion(s) by snare                            technique Diagnosis Code(s):        --- Professional ---                           Z86.010, Personal history of colonic polyps                           K64.8, Other hemorrhoids                           K62.1, Rectal polyp                           K63.5, Polyp of colon CPT copyright 2019 American Medical Association. All rights reserved. The codes documented in this report are preliminary and upon coder review may  be revised to meet current compliance requirements. [name] "[name],  05/08/2022 8:58:31 AM Number of Addenda: 0

## 2022-05-10 ENCOUNTER — Encounter (HOSPITAL_COMMUNITY): Payer: Self-pay | Admitting: Internal Medicine

## 2022-05-10 LAB — SURGICAL PATHOLOGY

## 2022-05-10 NOTE — Anesthesia Postprocedure Evaluation (Signed)
Anesthesia Post Note  Patient: Kevin Oconnell  Procedure(s) Performed: COLONOSCOPY WITH PROPOFOL ESOPHAGOGASTRODUODENOSCOPY (EGD) WITH PROPOFOL BIOPSY POLYPECTOMY     Patient location during evaluation: Endoscopy Anesthesia Type: MAC Level of consciousness: awake and alert Pain management: pain level controlled Vital Signs Assessment: post-procedure vital signs reviewed and stable Respiratory status: spontaneous breathing, nonlabored ventilation and respiratory function stable Cardiovascular status: stable and blood pressure returned to baseline Postop Assessment: no apparent nausea or vomiting Anesthetic complications: no   No notable events documented.  Last Vitals:  Vitals:   05/08/22 0900 05/08/22 0910  BP: (!) 111/52 (!) 103/56  Pulse: 76 79  Resp: 14 12  Temp:    SpO2: 92% 92%    Last Pain:  Vitals:   05/08/22 0910  TempSrc:   PainSc: 0-No pain                 Belisa Eichholz

## 2022-05-16 ENCOUNTER — Encounter (HOSPITAL_COMMUNITY): Payer: Self-pay | Admitting: Cardiology

## 2022-05-16 NOTE — Telephone Encounter (Signed)
Would arrange for 3 day Zio monitor to assess, ?frequent PVCs.

## 2022-05-23 ENCOUNTER — Other Ambulatory Visit (HOSPITAL_COMMUNITY): Payer: Self-pay | Admitting: Cardiology

## 2022-05-23 ENCOUNTER — Ambulatory Visit (HOSPITAL_COMMUNITY)
Admission: RE | Admit: 2022-05-23 | Discharge: 2022-05-23 | Disposition: A | Discharge status: Home / Self Care | Source: Ambulatory Visit | Attending: Cardiology | Admitting: Cardiology

## 2022-05-23 DIAGNOSIS — I493 Ventricular premature depolarization: Secondary | ICD-10-CM

## 2022-06-12 ENCOUNTER — Telehealth (HOSPITAL_COMMUNITY): Payer: Self-pay

## 2022-06-12 MED ORDER — AMIODARONE HCL 200 MG PO TABS: ORAL_TABLET | ORAL | 3 refills | Status: DC

## 2022-06-12 NOTE — Telephone Encounter (Addendum)
Pt aware, agreeable, and verbalized understanding  Rx sent to pharmacy   ----- Message from Laurey Morale, MD sent at 06/11/2022 10:21 PM EDT ----- PVCs up to 12%, think he should restart amiodarone 200 mg bid x 10 days then 200 mg daily.

## 2022-06-28 ENCOUNTER — Encounter (HOSPITAL_COMMUNITY): Payer: Self-pay | Admitting: Cardiology

## 2022-06-28 ENCOUNTER — Ambulatory Visit (HOSPITAL_COMMUNITY)
Admission: RE | Admit: 2022-06-28 | Discharge: 2022-06-28 | Disposition: A | Discharge status: Home / Self Care | Source: Ambulatory Visit | Attending: Cardiology | Admitting: Cardiology

## 2022-06-28 VITALS — BP 100/60 | HR 57 | Wt 277.6 lb

## 2022-06-28 DIAGNOSIS — I5022 Chronic systolic (congestive) heart failure: Secondary | ICD-10-CM

## 2022-06-28 DIAGNOSIS — Z79899 Other long term (current) drug therapy: Secondary | ICD-10-CM | POA: Insufficient documentation

## 2022-06-28 DIAGNOSIS — I2510 Atherosclerotic heart disease of native coronary artery without angina pectoris: Secondary | ICD-10-CM | POA: Diagnosis not present

## 2022-06-28 DIAGNOSIS — I493 Ventricular premature depolarization: Secondary | ICD-10-CM | POA: Diagnosis not present

## 2022-06-28 DIAGNOSIS — Z955 Presence of coronary angioplasty implant and graft: Secondary | ICD-10-CM | POA: Insufficient documentation

## 2022-06-28 DIAGNOSIS — R634 Abnormal weight loss: Secondary | ICD-10-CM | POA: Diagnosis not present

## 2022-06-28 DIAGNOSIS — E785 Hyperlipidemia, unspecified: Secondary | ICD-10-CM | POA: Diagnosis not present

## 2022-06-28 DIAGNOSIS — I340 Nonrheumatic mitral (valve) insufficiency: Secondary | ICD-10-CM | POA: Insufficient documentation

## 2022-06-28 DIAGNOSIS — G4733 Obstructive sleep apnea (adult) (pediatric): Secondary | ICD-10-CM | POA: Diagnosis not present

## 2022-06-28 DIAGNOSIS — Z7901 Long term (current) use of anticoagulants: Secondary | ICD-10-CM | POA: Insufficient documentation

## 2022-06-28 DIAGNOSIS — I480 Paroxysmal atrial fibrillation: Secondary | ICD-10-CM | POA: Diagnosis present

## 2022-06-28 DIAGNOSIS — E669 Obesity, unspecified: Secondary | ICD-10-CM | POA: Diagnosis not present

## 2022-06-28 LAB — CBC
HCT: 47 % (ref 39.0–52.0)
Hemoglobin: 15.6 g/dL (ref 13.0–17.0)
MCH: 29.8 pg (ref 26.0–34.0)
MCHC: 33.2 g/dL (ref 30.0–36.0)
MCV: 89.7 fL (ref 80.0–100.0)
Platelets: 294 10*3/uL (ref 150–400)
RBC: 5.24 MIL/uL (ref 4.22–5.81)
RDW: 14.6 % (ref 11.5–15.5)
WBC: 7.5 10*3/uL (ref 4.0–10.5)
nRBC: 0 % (ref 0.0–0.2)

## 2022-06-28 LAB — COMPREHENSIVE METABOLIC PANEL
ALT: 48 U/L — ABNORMAL HIGH (ref 0–44)
AST: 23 U/L (ref 15–41)
Albumin: 4.1 g/dL (ref 3.5–5.0)
Alkaline Phosphatase: 65 U/L (ref 38–126)
Anion gap: 7 (ref 5–15)
BUN: 15 mg/dL (ref 8–23)
CO2: 22 mmol/L (ref 22–32)
Calcium: 9.2 mg/dL (ref 8.9–10.3)
Chloride: 112 mmol/L — ABNORMAL HIGH (ref 98–111)
Creatinine, Ser: 1.09 mg/dL (ref 0.61–1.24)
GFR, Estimated: >60 mL/min (ref >60)
Glucose, Bld: 88 mg/dL (ref 70–99)
Potassium: 4.1 mmol/L (ref 3.5–5.1)
Sodium: 141 mmol/L (ref 135–145)
Total Bilirubin: 1.5 mg/dL — ABNORMAL HIGH (ref 0.3–1.2)
Total Protein: 6.6 g/dL (ref 6.5–8.1)

## 2022-06-28 LAB — LIPID PANEL
Cholesterol: 93 mg/dL (ref 0–200)
HDL: 29 mg/dL — ABNORMAL LOW (ref >40)
LDL Cholesterol: 30 mg/dL (ref 0–99)
Total CHOL/HDL Ratio: 3.2 RATIO
Triglycerides: 168 mg/dL — ABNORMAL HIGH (ref <150)
VLDL: 34 mg/dL (ref 0–40)

## 2022-06-28 LAB — TSH: TSH: 4.334 u[IU]/mL (ref 0.350–4.500)

## 2022-06-28 MED ORDER — METOPROLOL SUCCINATE ER 100 MG PO TB24: 100.0000 mg | ORAL_TABLET | Freq: Two times a day (BID) | ORAL | Status: DC

## 2022-06-28 NOTE — Progress Notes (Signed)
PCP: Raliegh Ip, DO Cardiology: Dr. Wyline Mood HF Cardiology: Dr. Shirlee Latch  66 y.o.with history of paroxysmal atrial fibrillation, CAD, and chronic systolic CHF was referred by Dr. Wyline Mood for CHF clinic evaluation.    Patient had no cardiac history prior to 2/18.  In 2/18, he developed the onset of exertional dyspnea. After steadily worsening dyspnea, he went to the ER at [facility] and was admitted.  Echo in 2/18 showed EF < 20% with severe MR.  He went back into NSR spontaneously.  He followed up with Dr. Wyline Mood and was set up for a cath in 3/18.  He was found to have a chronically occluded LCx and a long proximal to mid LAD stenosis.  No intervention was done.  He was noted to be back in rapid atrial fibrillation and was admitted.  He was diuresed and started on amiodarone, he converted back to NSR again spontaneously.    He underwent successful CTO procedure in 8/18 with DES to LCx, DES to pLAD, DES to dLAD.    In 10/18, he had atrial fibrillation ablation.  Now off amiodarone.   Cardiac MRI in 12/18 showed EF up to 44%.  Echo in 12/19 showed EF 40%. Echo in 1/21 showed EF 35-40% with diffuse hypokinesis and normal RV.   Patient had a presyncopal episode at home 10/30/20.  He was lightheaded but did not pass out.  On his pulse ox, HR was down to 30.  He went to the ER, HR was in 60s in the ER.  He was sent home.  Echo in 1/22 showed EF 35-40% range with global hypokinesis, mild LVH, mildly decreased RV systolic function.  Cardiac MRI was done again to more closely quantify EF (?ICD), showed LV EF 51%, RVEF 48%, small area of anteroseptal LGE c/w prior MI.   Echo in 1/23 showed EF 45%, mild LVH, mildly decreased RV systolic function. Zio monitor in 8/23 showed 12% PVCs, amiodarone was started.   He returns for followup of CHF.  He is working part-time now driving a school bus.  He is using CPAP.  He was unable to tolerate semaglutide.  No significant exertional dyspnea, he walks up to a  mile/day.  He feels occasional palpitations.  No lightheadedness or syncope. No chest pain.   ECG (personally reviewed): NSR, PVCs, right superior axis  Labs (4/18): K 4.5, creatinine 1.0 Labs (5/18): K 4.2, creatinine 1.25, LFTs normal, TSH normal Labs (6/18): LDL 14, HDL 28 Labs (8/18): K 4.1 => 3.9, creatinine 1.0 => 1.14, LFTs normal, TSH normal Labs (10/18): hgb 13.8, K 4.4, creatinine 1.02 Labs (1/19): hgb 13.6, TSH normal, K 4.6, creatinine 1.05 Labs (02/11/2018): K 4.2 Creatinine 0.99 Labs (9/19): K 4.6, creatinine 0.99, hgb 13.6, LDL 31, HDL 28 Labs (2/20): K 4.5, creatinine 0.86 Labs (7/20): K 4.4, creatinine 1.33 Labs (1/21): K 4.4, creatinine 1.04, LDL 40 Labs (4/21): K 4.5, creatinine 0.99 Labs (1/22): K 4.2, creatinine 0.99, LDL 51, HDL 27 Labs (4/22): K 4.4, creatinine 0.91 Labs (11/22): K 4.9, creatinine 0.98, LDL 25, TGs 164 Labs (6/23): K 4.8, creatinine 1.04  PMH: 1. Atrial fibrillation: Paroxysmal.  2. 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  - Echo (2/18) with mild LV dilation, EF < 20%, severe mitral regurgitation.  - RHC/LHC (3/18): long 80% proximal-mid LAD stenosis, total occlusion of the LCx with left to left collaterals, RCA ok. Mean RA 7, PA 32/10, mean PCWP  16, LVEDP 12, CI 1.54.  - Echo (6/18) with EF 30-35%, diffuse hypokinesis, normal RV size and systolic function, trivial MR.  - Echo (12/18): EF 35%, diffuse hypokinesis, normal RV size with mildly decreased systolic function.  - Cardiac MRI (12/18): EF 44%, small area of LGE in mid anteroseptal wall suggestive of prior infarction. - Echo (12/19): EF 40%, diffuse hypokinesis, normal RV size with mildly decreased systolic function.    - Echo (3/01): EF 35-40%, diffuse hypokinesis, normal RV size and systolic function.  - Echo (1/22): EF 35-40% range with global hypokinesis, mild LVH, mildly decreased RV  systolic function. - Cardiac MRI (3/22): LV EF 51%, RVEF 48%, small area of anteroseptal LGE c/w prior MI.  - Echo (1/23): EF 45%, mild LVH, mildly decreased RV systolic function.  3. CAD: LHC (3/18) with long 80% proximal-mid LAD stenosis, total occlusion of the LCx with left to left collaterals, RCA ok. - 8/18: CTO intervention with DES to LCx and DES x 2 to proximal and mid LAD.  4. Mitral regurgitation: Severe on 2/18 echo.  Suspect functional, as MR only trivial on 6/18 echo.  5. Sleep study (6/18) with no OSA but nocturnal hypoxemia.  He was instructed to start 2 liters oxygen at night but his insurance would not cover.   6. Allergic rhinitis 7. PVCs - Zio patch 12/19: 1.1% PVCs, 4 runs NSVT, longest 10 beats.  - Zio patch 2/22: 6% PVCs, 13 runs NSVT, longest 15 beats - Zio patch 8/23: 12% PVCs 8. Rheumatoid arthritis 9. OSA  SH: Midwife and school custodian, lives in [place], quit smoking in 2011, married.   FH: Grandfather with MIs, mother with CABG and CHF.   Review of systems complete and found to be negative unless listed in HPI.    Current Outpatient Medications  Medication Sig Dispense Refill   acetaminophen (TYLENOL) 500 MG tablet Take 1,000 mg by mouth every 6 (six) hours as needed for moderate pain, mild pain or headache.     amiodarone (PACERONE) 200 MG tablet 1 tab Twice daily for 10 days, then 1 tablet daily 180 tablet 3   atorvastatin (LIPITOR) 80 MG tablet TAKE 1 TABLET DAILY AT 6 P.M. 90 tablet 3   diphenhydrAMINE (BENADRYL) 25 MG tablet Take 25 mg by mouth at bedtime as needed (sinus pressure).     ELIQUIS 5 MG TABS tablet TAKE 1 TABLET TWICE A DAY 180 tablet 3   ENTRESTO 97-103 MG TAKE 1 TABLET TWICE A DAY 180 tablet 3   finasteride (PROSCAR) 5 MG tablet Take 1 tablet (5 mg total) by mouth daily. 90 tablet 0   fluticasone (FLONASE) 50 MCG/ACT nasal spray Place 1 spray into both nostrils daily as needed for rhinitis.     furosemide (LASIX) 20 MG tablet Take  1 tablet (20 mg total) by mouth daily as needed. 90 tablet 3   JARDIANCE 10 MG TABS tablet TAKE 1 TABLET DAILY BEFORE BREAKFAST 90 tablet 3   spironolactone (ALDACTONE) 25 MG tablet Take 1 tablet (25 mg total) by mouth at bedtime. 90 tablet 3   Vitamin D3 (VITAMIN D) 25 MCG tablet Take 1,000 Units by mouth 4 (four) times a week. Sat, Sun, Tues and Thursday     metoprolol succinate (TOPROL-XL) 100 MG 24 hr tablet Take 1 tablet (100 mg total) by mouth 2 (two) times daily. Take with or immediately following a meal.     No current facility-administered medications for this encounter.   BP 100/60  Pulse (!) 57   Wt 125.9 kg (277 lb 9.6 oz)   SpO2 96%   BMI 33.79 kg/m    Wt Readings from Last 3 Encounters:  06/28/22 125.9 kg (277 lb 9.6 oz)  05/08/22 127 kg (280 lb)  03/16/22 130.2 kg (287 lb)    General: NAD Neck: Thick. No JVD, no thyromegaly or thyroid nodule.  Lungs: Clear to auscultation bilaterally with normal respiratory effort. CV: Nondisplaced PMI.  Heart regular S1/S2, no S3/S4, no murmur.  No peripheral edema.  No carotid bruit.  Normal pedal pulses.  Abdomen: Soft, nontender, no hepatosplenomegaly, no distention.  Skin: Intact without lesions or rashes.  Neurologic: Alert and oriented x 3.  Psych: Normal affect. Extremities: No clubbing or cyanosis.  HEENT: Normal.   Assessment/Plan: 1. Atrial fibrillation: Paroxysmal.  Now s/p atrial fibrillation ablation in 10/18.  NSR today.  He is on amiodarone (for PVCs primarily).  - Continue Eliquis for anticoagulation. CBC today.   2. 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  Echo in 2/18 with EF < 20%.  EF 30-35% in 6/18 and 35% on repeat echo 12/18. Cardiac MRI in 12/18 to determine need for ICD showed EF 44%. Echo in 1/21 showed EF 35-40%.  Echo in 1/22 with EF about 35-40%.  Cardiac MRI in 3/22 showed LV EF up to 51%.  Echo in 1/23  with EF 45%.  NYHA class I-II symptoms.  He is not volume overloaded on exam.  - He can increase Toprol XL to 100 mg bid.  - Continue Entresto 97/103 bid.   - Continue spironolactone 25 daily. BMET today.  - Continue empagliflozin.  - Continue to use Lasix only prn.  3. CAD: s/p DES to LCx and LAD in 05/2017.  No chest pain.  - No ASA given stable CAD and use of Eliquis.  - Continue statin. Check lipids.  4. Mitral regurgitation: Minimal on 1/22 echo and on echo in 1/23.  5. PVCs: 12% on 8/23 Zio patch, now back on amiodarone.  - Increase Toprol XL as above.  - Continue amiodarone 200 mg daily.  Check LFTs/TSH today, will need regular eye exam.  - Repeat Zio monitor in 2 wks or so once he has been on amiodarone for a longer period to reassess PVC percentage.  6. OSA: Using CPAP.  7. Obesity: He did not tolerate semaglutide but is losing weight on his own.   Followup in 3 months with APP.    Loralie Champagne 06/28/2022

## 2022-06-28 NOTE — Patient Instructions (Signed)
Increase Topro; XL to 1100mg  Twice daily  Labs done today, your results will be available in MyChart, we will contact you for abnormal readings.  Your provider has recommended that  you wear a Zio Patch for 3 days.  This monitor will record your heart rhythm for our review.  IF you have any symptoms while wearing the monitor please press the button.  If you have any issues with the patch or you notice a red or orange light on it please call the company at [phone].  Once you remove the patch please mail it back to the company as soon as possible so we can get the results. We will have the ZIO mailed out to you.  Your physician recommends that you schedule a follow-up appointment in: 3 months  If you have any questions or concerns before your next appointment please send [phone] a message through [facility] or call our office at [phone].    TO LEAVE A MESSAGE FOR THE NURSE SELECT OPTION 2, PLEASE LEAVE A MESSAGE INCLUDING: YOUR NAME DATE OF BIRTH CALL BACK NUMBER REASON FOR CALL**this is important as we prioritize the call backs  YOU WILL RECEIVE A CALL BACK THE SAME DAY AS LONG AS YOU CALL BEFORE 4:00 PM  At the [facility], you and your health needs are our priority. As part of our continuing mission to provide you with exceptional heart care, we have created designated Provider Care Teams. These Care Teams include your primary Cardiologist (physician) and Advanced Practice Providers (APPs- Physician Assistants and Nurse Practitioners) who all work together to provide you with the care you need, when you need it.   You may see any of the following providers on your designated Care Team at your next follow up: Dr [phone] Dr Arvilla Meres Dr. Marca Ancona, NP Marcos Eke, Robbie Lis [facility] [place], [place], NP Brynda Peon, PharmD   Please be sure to bring in all your medications bottles to every appointment.

## 2022-07-04 ENCOUNTER — Telehealth (HOSPITAL_COMMUNITY): Payer: Self-pay

## 2022-07-04 NOTE — Telephone Encounter (Addendum)
Pt aware, agreeable, and verbalized understanding  Lab order sent   ----- Message from Larey Dresser, MD sent at 06/28/2022  4:10 PM EDT ----- Slightly elevated ALT.  Repeat CMET in 2 wks.

## 2022-07-05 ENCOUNTER — Other Ambulatory Visit (HOSPITAL_COMMUNITY): Payer: Self-pay | Admitting: Cardiology

## 2022-07-07 ENCOUNTER — Other Ambulatory Visit (HOSPITAL_COMMUNITY): Payer: Self-pay | Admitting: Cardiology

## 2022-07-07 ENCOUNTER — Inpatient Hospital Stay (HOSPITAL_COMMUNITY)
Admission: RE | Admit: 2022-07-07 | Discharge: 2022-07-07 | Disposition: A | Discharge status: Home / Self Care | Source: Ambulatory Visit | Attending: Cardiology | Admitting: Cardiology

## 2022-07-07 DIAGNOSIS — I493 Ventricular premature depolarization: Secondary | ICD-10-CM

## 2022-07-13 ENCOUNTER — Other Ambulatory Visit (HOSPITAL_COMMUNITY): Payer: Self-pay | Admitting: Cardiology

## 2022-07-13 ENCOUNTER — Other Ambulatory Visit

## 2022-07-14 LAB — CMP14+EGFR
ALT: 42 IU/L (ref 0–44)
AST: 24 IU/L (ref 0–40)
Albumin/Globulin Ratio: 2.1 (ref 1.2–2.2)
Albumin: 4.5 g/dL (ref 3.9–4.9)
Alkaline Phosphatase: 80 IU/L (ref 44–121)
BUN/Creatinine Ratio: 13 (ref 10–24)
BUN: 15 mg/dL (ref 8–27)
Bilirubin Total: 1 mg/dL (ref 0.0–1.2)
CO2: 22 mmol/L (ref 20–29)
Calcium: 9.6 mg/dL (ref 8.6–10.2)
Chloride: 108 mmol/L — ABNORMAL HIGH (ref 96–106)
Creatinine, Ser: 1.17 mg/dL (ref 0.76–1.27)
Globulin, Total: 2.1 g/dL (ref 1.5–4.5)
Glucose: 101 mg/dL — ABNORMAL HIGH (ref 70–99)
Potassium: 4.9 mmol/L (ref 3.5–5.2)
Sodium: 145 mmol/L — ABNORMAL HIGH (ref 134–144)
Total Protein: 6.6 g/dL (ref 6.0–8.5)
eGFR: 70 mL/min/{1.73_m2} (ref >59)

## 2022-07-15 ENCOUNTER — Telehealth: Payer: Self-pay | Admitting: Cardiology

## 2022-07-15 ENCOUNTER — Telehealth: Admitting: Nurse Practitioner

## 2022-07-15 DIAGNOSIS — U071 COVID-19: Secondary | ICD-10-CM | POA: Diagnosis not present

## 2022-07-15 MED ORDER — MOLNUPIRAVIR EUA 200MG CAPSULE: 4.0000 | ORAL_CAPSULE | Freq: Two times a day (BID) | ORAL | 0 refills | Status: AC

## 2022-07-15 NOTE — Patient Instructions (Addendum)
Kevin Oconnell, thank you for joining Gildardo Pounds, NP for today's virtual visit.  While this provider is not your primary care provider (PCP), if your PCP is located in our provider database this encounter information will be shared with them immediately following your visit.  Consent: (Patient) Kevin Oconnell provided verbal consent for this virtual visit at the beginning of the encounter.  Current Medications:  Current Outpatient Medications:    molnupiravir EUA (LAGEVRIO) 200 mg CAPS capsule, Take 4 capsules (800 mg total) by mouth 2 (two) times daily for 5 days., Disp: 40 capsule, Rfl: 0   acetaminophen (TYLENOL) 500 MG tablet, Take 1,000 mg by mouth every 6 (six) hours as needed for moderate pain, mild pain or headache., Disp: , Rfl:    amiodarone (PACERONE) 200 MG tablet, 1 tab Twice daily for 10 days, then 1 tablet daily, Disp: 180 tablet, Rfl: 3   atorvastatin (LIPITOR) 80 MG tablet, TAKE 1 TABLET DAILY AT 6 P.M., Disp: 90 tablet, Rfl: 3   diphenhydrAMINE (BENADRYL) 25 MG tablet, Take 25 mg by mouth at bedtime as needed (sinus pressure)., Disp: , Rfl:    ELIQUIS 5 MG TABS tablet, TAKE 1 TABLET TWICE A DAY, Disp: 180 tablet, Rfl: 3   ENTRESTO 97-103 MG, TAKE 1 TABLET TWICE A DAY, Disp: 180 tablet, Rfl: 3   finasteride (PROSCAR) 5 MG tablet, Take 1 tablet (5 mg total) by mouth daily., Disp: 90 tablet, Rfl: 0   fluticasone (FLONASE) 50 MCG/ACT nasal spray, Place 1 spray into both nostrils daily as needed for rhinitis., Disp: , Rfl:    furosemide (LASIX) 20 MG tablet, TAKE 1 TABLET DAILY AS NEEDED, Disp: 90 tablet, Rfl: 3   JARDIANCE 10 MG TABS tablet, TAKE 1 TABLET DAILY BEFORE BREAKFAST, Disp: 90 tablet, Rfl: 3   metoprolol succinate (TOPROL-XL) 100 MG 24 hr tablet, Take 1 tablet (100 mg total) by mouth 2 (two) times daily. Take with or immediately following a meal., Disp: , Rfl:    spironolactone (ALDACTONE) 25 MG tablet, Take 1 tablet (25 mg total) by mouth at bedtime., Disp: 90  tablet, Rfl: 3   Vitamin D3 (VITAMIN D) 25 MCG tablet, Take 1,000 Units by mouth 4 (four) times a week. Sat, Sun, Tues and Thursday, Disp: , Rfl:    Medications ordered in this encounter:  Meds ordered this encounter  Medications   molnupiravir EUA (LAGEVRIO) 200 mg CAPS capsule    Sig: Take 4 capsules (800 mg total) by mouth 2 (two) times daily for 5 days.    Dispense:  40 capsule    Refill:  0    Order Specific Question:   Supervising Provider    Answer:   Sabra Heck, [place]     *If you need refills on other medications prior to your next appointment, please contact your pharmacy*  Follow-Up: Call back or seek an in-person evaluation if the symptoms worsen or if the condition fails to improve as anticipated.  [facility] [phone]  Other Instructions Take tylenol for pain and or fever Please keep well-hydrated and get plenty of rest. Start a saline nasal rinse to flush out your nasal passages. You can use plain Mucinex to help thin congestion. If you have a humidifier, you can use this daily as needed.    You are to wear a mask for 5 days from onset of your symptoms.  After day 5, if you have had no fever and you are feeling better with  NO symptoms, you can end masking. Keep in mind you can be contagious 10 days from the onset of symptoms  After day 5 if you have a fever or are having significant symptoms, please wear your mask for full 10 days.   If you note any worsening of symptoms, any significant shortness of breath or any chest pain, please seek ER evaluation ASAP.  Please do not delay care!    If you note any worsening of symptoms, any significant shortness of breath or any chest pain, please seek ER evaluation ASAP.  Please do not delay care!    If you have been instructed to have an in-person evaluation today at a local Urgent Care facility, please use the link below. It will take you to a list of all of our available [facility],  including address, phone number and hours of operation. Please do not delay care.  [facility]  If you or a family member do not have a primary care provider, use the link below to schedule a visit and establish care. When you choose a [facility] primary care physician or advanced practice provider, you gain a long-term partner in health. Find a Primary Care Provider  Learn more about [facility]'s in-office and virtual care options: [facility]

## 2022-07-15 NOTE — Progress Notes (Signed)
Virtual Visit Consent   Kevin Oconnell, you are scheduled for a virtual visit with a [facility] provider today. Just as with appointments in the office, your consent must be obtained to participate. Your consent will be active for this visit and any virtual visit you may have with one of our providers in the next 365 days. If you have a MyChart account, a copy of this consent can be sent to you electronically.  As this is a virtual visit, video technology does not allow for your provider to perform a traditional examination. This may limit your provider's ability to fully assess your condition. If your provider identifies any concerns that need to be evaluated in person or the need to arrange testing (such as labs, EKG, etc.), we will make arrangements to do so. Although advances in technology are sophisticated, we cannot ensure that it will always work on either your end or our end. If the connection with a video visit is poor, the visit may have to be switched to a telephone visit. With either a video or telephone visit, we are not always able to ensure that we have a secure connection.  By engaging in this virtual visit, you consent to the provision of healthcare and authorize for your insurance to be billed (if applicable) for the services provided during this visit. Depending on your insurance coverage, you may receive a charge related to this service.  I need to obtain your verbal consent now. Are you willing to proceed with your visit today? Kevin Oconnell has provided verbal consent on 07/15/2022 for a virtual visit (video or telephone). Kevin Pounds, NP  Date: 07/15/2022 4:15 PM  Virtual Visit via Video Note   I, Kevin Oconnell, connected with  Kevin Oconnell  ([id number], [birth year]) on 07/15/22 at  4:15 PM EDT by a video-enabled telemedicine application and verified that I am speaking with the correct person using two identifiers.  Location: Patient: Virtual Visit Location  Patient: Home Provider: Virtual Visit Location Provider: Home Office   I discussed the limitations of evaluation and management by telemedicine and the availability of in person appointments. The patient expressed understanding and agreed to proceed.    History of Present Illness: Kevin Oconnell is a 66 y.o. who identifies as a male who was assigned male at birth, and is being seen today for Positive COVID test with symptoms.  Tested positive for COVID today via home test. Symptoms started yesterday. He states his bus monitor threw up right beside him and Kevin Oconnell believes the bus monitor likely had COVID at that time. 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[place]) 01/17/2021   Vitamin D deficiency 01/17/2021   Multiple skin nodules 01/17/2021   Urine retention 05/20/2020   AF (atrial fibrillation) ([place]) 07/27/2017   Mitral regurgitation 03/13/2017   Persistent atrial fibrillation (H[facility])    Coronary artery disease involving native coronary artery of native heart without angina pectoris    Chronic anticoagulation    Chronic systolic CHF (congestive heart failure) ([place]) 12/14/2016   Ischemic cardiomyopathy    Atrial fibrillation ([place]) 12/07/2016   Neck pain 12/16/2015   Annual physical exam 12/16/2015   Chronic musculoskeletal pain 11/16/2015   BPH (benign prostatic hyperplasia)  11/16/2015   Fatigue 11/16/2015    Allergies: No Known Allergies Medications:  Current Outpatient Medications:    molnupiravir EUA (LAGEVRIO) 200 mg CAPS capsule, Take 4 capsules (800 mg total) by mouth 2 (two) times daily for 5 days., Disp: 40 capsule, Rfl: 0   acetaminophen (TYLENOL) 500 MG tablet, Take 1,000 mg by mouth every 6 (six) hours as  needed for moderate pain, mild pain or headache., Disp: , Rfl:    amiodarone (PACERONE) 200 MG tablet, 1 tab Twice daily for 10 days, then 1 tablet daily, Disp: 180 tablet, Rfl: 3   atorvastatin (LIPITOR) 80 MG tablet, TAKE 1 TABLET DAILY AT 6 P.M., Disp: 90 tablet, Rfl: 3   diphenhydrAMINE (BENADRYL) 25 MG tablet, Take 25 mg by mouth at bedtime as needed (sinus pressure)., Disp: , Rfl:    ELIQUIS 5 MG TABS tablet, TAKE 1 TABLET TWICE A DAY, Disp: 180 tablet, Rfl: 3   ENTRESTO 97-103 MG, TAKE 1 TABLET TWICE A DAY, Disp: 180 tablet, Rfl: 3   finasteride (PROSCAR) 5 MG tablet, Take 1 tablet (5 mg total) by mouth daily., Disp: 90 tablet, Rfl: 0   fluticasone (FLONASE) 50 MCG/ACT nasal spray, Place 1 spray into both nostrils daily as needed for rhinitis., Disp: , Rfl:    furosemide (LASIX) 20 MG tablet, TAKE 1 TABLET DAILY AS NEEDED, Disp: 90 tablet, Rfl: 3   JARDIANCE 10 MG TABS tablet, TAKE 1 TABLET DAILY BEFORE BREAKFAST, Disp: 90 tablet, Rfl: 3   metoprolol succinate (TOPROL-XL) 100 MG 24 hr tablet, Take 1 tablet (100 mg total) by mouth 2 (two) times daily. Take with or immediately following a meal., Disp: , Rfl:    spironolactone (ALDACTONE) 25 MG tablet, Take 1 tablet (25 mg total) by mouth at bedtime., Disp: 90 tablet, Rfl: 3   Vitamin D3 (VITAMIN D) 25 MCG tablet, Take 1,000 Units by mouth 4 (four) times a week. Sat, Sun, Tues and Thursday, Disp: , Rfl:   Observations/Objective: Patient is well-developed, well-nourished in no acute distress.  Resting comfortably at home.  Head is normocephalic, atraumatic.  No labored breathing.  Speech is clear and coherent with logical content.  Patient is alert and oriented at baseline.    Assessment and Plan: 1. Positive self-administered antigen test for COVID-19 - molnupiravir EUA (LAGEVRIO) 200 mg CAPS capsule; Take 4 capsules (800 mg total) by mouth 2 (two) times daily for 5 days.  Dispense: 40 capsule; Refill: 0   Please keep well-hydrated  and get plenty of rest. Start a saline nasal rinse to flush out your nasal passages. You can use plain Mucinex to help thin congestion. If you have a humidifier, you can use this daily as needed.    You are to wear a mask for 5 days from onset of your symptoms.  After day 5, if you have had no fever and you are feeling better with NO symptoms, you can end masking. Keep in mind you can be contagious 10 days from the onset of symptoms  After day 5 if you have a fever or are having significant symptoms, please wear your mask for full 10 days.   If you note any worsening of symptoms, any significant shortness of breath or any chest pain, please seek ER evaluation ASAP.  Please do not delay care!    If you note any worsening of symptoms, any significant shortness of breath or any chest pain, please seek ER evaluation ASAP.  Please do not delay care!  Follow Up Instructions: I discussed the assessment and treatment plan with the patient. The patient was provided an opportunity to ask questions and all were answered. The patient agreed with the plan and demonstrated an understanding of the instructions.  A copy of instructions were sent to the patient via MyChart unless otherwise noted below.    The patient was advised to call back or seek an in-person evaluation if the symptoms worsen or if the condition fails to improve as anticipated.  Time:  I spent 11 minutes with the patient via telehealth technology discussing the above problems/concerns.    Claiborne Rigg, NP

## 2022-07-15 NOTE — Telephone Encounter (Signed)
   The patient called the answering service after-hours today.  I spoke with patient's wife who reports that Mr. Decoteau tested positive for COVID today. 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 He became symptomatic this morning with fever, chills, body aches, and significant sinus congestion. Patient's spouse is curious about his eligibility for anti-viral therapy. Advised patient and spouse to seek either virtual evaluation via [facility] virtual urgent care or to be seen in person at urgent care/ED. Patient's spouse confirmed understanding and was agreeable to this plan.  Lily Kocher, PA-C

## 2022-07-24 ENCOUNTER — Other Ambulatory Visit (HOSPITAL_COMMUNITY): Payer: Self-pay | Admitting: Cardiology

## 2022-08-14 ENCOUNTER — Telehealth: Payer: Self-pay | Admitting: Family Medicine

## 2022-08-14 NOTE — Telephone Encounter (Signed)
Has not been seen since 12/03/20, this has never been discussed. Please schedule first available

## 2022-08-14 NOTE — Telephone Encounter (Signed)
Reason for Referral:  Has the referral been discussed with the patient?: no  Designated contact for the referral if not the patient (name/phone number): [phone]  Has the patient seen a specialist for this issue before?:no  If so, who (practice/provider)?  Does the patient have a provider or location preference for the referral?: no Would the patient like to see previous specialist if applicable?   Would like to have appointment with audiologist because he is having issues with hearing. Attempted to make appointment with PCP but patient needs 10 am appointment. Please call back and advise.

## 2022-08-16 NOTE — Telephone Encounter (Signed)
Called patient to schedule first available appt. Pt says he found out that he does not need the referral.

## 2022-09-15 ENCOUNTER — Ambulatory Visit: Admitting: Nurse Practitioner

## 2022-09-25 NOTE — Progress Notes (Signed)
PCP: Raliegh Ip, DO Cardiology: Dr. Wyline Mood HF Cardiology: Dr. Shirlee Latch  66 y.o.with history of paroxysmal atrial fibrillation, CAD, and chronic systolic CHF was referred by Dr. Wyline Mood for CHF clinic evaluation.    Patient had no cardiac history prior to 2/18.  In 2/18, he developed the onset of exertional dyspnea. After steadily worsening dyspnea, he went to the ER at [facility] and was admitted.  Echo in 2/18 showed EF < 20% with severe MR.  He went back into NSR spontaneously.  He followed up with Dr. Wyline Mood and was set up for a cath in 3/18.  He was found to have a chronically occluded LCx and a long proximal to mid LAD stenosis.  No intervention was done.  He was noted to be back in rapid atrial fibrillation and was admitted.  He was diuresed and started on amiodarone, he converted back to NSR again spontaneously.    He underwent successful CTO procedure in 8/18 with DES to LCx, DES to pLAD, DES to dLAD.    In 10/18, he had atrial fibrillation ablation.  Now off amiodarone.   Cardiac MRI in 12/18 showed EF up to 44%.  Echo in 12/19 showed EF 40%. Echo in 1/21 showed EF 35-40% with diffuse hypokinesis and normal RV.   Patient had a presyncopal episode at home 10/30/20.  He was lightheaded but did not pass out.  On his pulse ox, HR was down to 30.  He went to the ER, HR was in 60s in the ER.  He was sent home.  Echo in 1/22 showed EF 35-40% range with global hypokinesis, mild LVH, mildly decreased RV systolic function.  Cardiac MRI was done again to more closely quantify EF (?ICD), showed LV EF 51%, RVEF 48%, small area of anteroseptal LGE c/w prior MI.   Echo in 1/23 showed EF 45%, mild LVH, mildly decreased RV systolic function. Zio monitor in 8/23 showed 12% PVCs, amiodarone was started.   He returns for followup of CHF.  He is working part-time now driving a school bus.  He is using CPAP.  He was unable to tolerate semaglutide.  No significant exertional dyspnea, he walks up to a  mile/day.  He feels occasional palpitations.  No lightheadedness or syncope. No chest pain.   ECG (personally reviewed): NSR, PVCs, right superior axis  Labs (4/18): K 4.5, creatinine 1.0 Labs (5/18): K 4.2, creatinine 1.25, LFTs normal, TSH normal Labs (6/18): LDL 14, HDL 28 Labs (8/18): K 4.1 => 3.9, creatinine 1.0 => 1.14, LFTs normal, TSH normal Labs (10/18): hgb 13.8, K 4.4, creatinine 1.02 Labs (1/19): hgb 13.6, TSH normal, K 4.6, creatinine 1.05 Labs (02/11/2018): K 4.2 Creatinine 0.99 Labs (9/19): K 4.6, creatinine 0.99, hgb 13.6, LDL 31, HDL 28 Labs (2/20): K 4.5, creatinine 0.86 Labs (7/20): K 4.4, creatinine 1.33 Labs (1/21): K 4.4, creatinine 1.04, LDL 40 Labs (4/21): K 4.5, creatinine 0.99 Labs (1/22): K 4.2, creatinine 0.99, LDL 51, HDL 27 Labs (4/22): K 4.4, creatinine 0.91 Labs (11/22): K 4.9, creatinine 0.98, LDL 25, TGs 164 Labs (6/23): K 4.8, creatinine 1.04  PMH: 1. Atrial fibrillation: Paroxysmal.  2. 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  - Echo (2/18) with mild LV dilation, EF < 20%, severe mitral regurgitation.  - RHC/LHC (3/18): long 80% proximal-mid LAD stenosis, total occlusion of the LCx with left to left collaterals, RCA ok. Mean RA 7, PA 32/10, mean PCWP  16, LVEDP 12, CI 1.54.  - Echo (6/18) with EF 30-35%, diffuse hypokinesis, normal RV size and systolic function, trivial MR.  - Echo (12/18): EF 35%, diffuse hypokinesis, normal RV size with mildly decreased systolic function.  - Cardiac MRI (12/18): EF 44%, small area of LGE in mid anteroseptal wall suggestive of prior infarction. - Echo (12/19): EF 40%, diffuse hypokinesis, normal RV size with mildly decreased systolic function.    - Echo (8/12): EF 35-40%, diffuse hypokinesis, normal RV size and systolic function.  - Echo (1/22): EF 35-40% range with global hypokinesis, mild LVH, mildly decreased RV  systolic function. - Cardiac MRI (3/22): LV EF 51%, RVEF 48%, small area of anteroseptal LGE c/w prior MI.  - Echo (1/23): EF 45%, mild LVH, mildly decreased RV systolic function.  3. CAD: LHC (3/18) with long 80% proximal-mid LAD stenosis, total occlusion of the LCx with left to left collaterals, RCA ok. - 8/18: CTO intervention with DES to LCx and DES x 2 to proximal and mid LAD.  4. Mitral regurgitation: Severe on 2/18 echo.  Suspect functional, as MR only trivial on 6/18 echo.  5. Sleep study (6/18) with no OSA but nocturnal hypoxemia.  He was instructed to start 2 liters oxygen at night but his insurance would not cover.   6. Allergic rhinitis 7. PVCs - Zio patch 12/19: 1.1% PVCs, 4 runs NSVT, longest 10 beats.  - Zio patch 2/22: 6% PVCs, 13 runs NSVT, longest 15 beats - Zio patch 8/23: 12% PVCs 8. Rheumatoid arthritis 9. OSA  SH: Midwife and school custodian, lives in [place], quit smoking in 2011, married.   FH: Grandfather with MIs, mother with CABG and CHF.   Review of systems complete and found to be negative unless listed in HPI.    Current Outpatient Medications  Medication Sig Dispense Refill   acetaminophen (TYLENOL) 500 MG tablet Take 1,000 mg by mouth every 6 (six) hours as needed for moderate pain, mild pain or headache.     amiodarone (PACERONE) 200 MG tablet 1 tab Twice daily for 10 days, then 1 tablet daily 180 tablet 3   atorvastatin (LIPITOR) 80 MG tablet TAKE 1 TABLET DAILY AT 6 P.M. 90 tablet 3   diphenhydrAMINE (BENADRYL) 25 MG tablet Take 25 mg by mouth at bedtime as needed (sinus pressure).     ELIQUIS 5 MG TABS tablet TAKE 1 TABLET TWICE A DAY 180 tablet 3   ENTRESTO 97-103 MG TAKE 1 TABLET TWICE A DAY 180 tablet 3   finasteride (PROSCAR) 5 MG tablet Take 1 tablet (5 mg total) by mouth daily. 90 tablet 0   fluticasone (FLONASE) 50 MCG/ACT nasal spray Place 1 spray into both nostrils daily as needed for rhinitis.     furosemide (LASIX) 20 MG tablet TAKE  1 TABLET DAILY AS NEEDED 90 tablet 3   JARDIANCE 10 MG TABS tablet TAKE 1 TABLET DAILY BEFORE BREAKFAST 90 tablet 3   metoprolol succinate (TOPROL-XL) 100 MG 24 hr tablet Take 1 tablet (100 mg total) by mouth 2 (two) times daily. Take with or immediately following a meal.     spironolactone (ALDACTONE) 25 MG tablet Take 1 tablet (25 mg total) by mouth at bedtime. 90 tablet 3   Vitamin D3 (VITAMIN D) 25 MCG tablet Take 1,000 Units by mouth 4 (four) times a week. Sat, Sun, Tues and Thursday     No current facility-administered medications for this visit.   There were no vitals taken for this  visit.   Wt Readings from Last 3 Encounters:  06/28/22 125.9 kg (277 lb 9.6 oz)  05/08/22 127 kg (280 lb)  03/16/22 130.2 kg (287 lb)    General: NAD Neck: Thick. No JVD, no thyromegaly or thyroid nodule.  Lungs: Clear to auscultation bilaterally with normal respiratory effort. CV: Nondisplaced PMI.  Heart regular S1/S2, no S3/S4, no murmur.  No peripheral edema.  No carotid bruit.  Normal pedal pulses.  Abdomen: Soft, nontender, no hepatosplenomegaly, no distention.  Skin: Intact without lesions or rashes.  Neurologic: Alert and oriented x 3.  Psych: Normal affect. Extremities: No clubbing or cyanosis.  HEENT: Normal.   Assessment/Plan: 1. Atrial fibrillation: Paroxysmal.  Now s/p atrial fibrillation ablation in 10/18.  NSR today.  He is on amiodarone (for PVCs primarily).  - Continue Eliquis for anticoagulation. CBC today.   2. 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  Echo in 2/18 with EF < 20%.  EF 30-35% in 6/18 and 35% on repeat echo 12/18. Cardiac MRI in 12/18 to determine need for ICD showed EF 44%. Echo in 1/21 showed EF 35-40%.  Echo in 1/22 with EF about 35-40%.  Cardiac MRI in 3/22 showed LV EF up to 51%.  Echo in 1/23 with EF 45%.  NYHA class I-II symptoms.  He is not volume overloaded on exam.   - He can increase Toprol XL to 100 mg bid.  - Continue Entresto 97/103 bid.   - Continue spironolactone 25 daily. BMET today.  - Continue empagliflozin.  - Continue to use Lasix only prn.  3. CAD: s/p DES to LCx and LAD in 05/2017.  No chest pain.  - No ASA given stable CAD and use of Eliquis.  - Continue statin. Check lipids.  4. Mitral regurgitation: Minimal on 1/22 echo and on echo in 1/23.  5. PVCs: 12% on 8/23 Zio patch, now back on amiodarone.  - Increase Toprol XL as above.  - Continue amiodarone 200 mg daily.  Check LFTs/TSH today, will need regular eye exam.  - Repeat Zio monitor in 2 wks or so once he has been on amiodarone for a longer period to reassess PVC percentage.  6. OSA: Using CPAP.  7. Obesity: He did not tolerate semaglutide but is losing weight on his own.   Followup in 3 months with APP.    Anderson Malta [facility] 09/25/2022

## 2022-09-27 ENCOUNTER — Ambulatory Visit (HOSPITAL_COMMUNITY)
Admission: RE | Admit: 2022-09-27 | Discharge: 2022-09-27 | Disposition: A | Discharge status: Home / Self Care | Source: Ambulatory Visit | Attending: Family Medicine | Admitting: Family Medicine

## 2022-09-27 ENCOUNTER — Encounter (HOSPITAL_COMMUNITY): Payer: Self-pay

## 2022-09-27 VITALS — BP 110/70 | HR 64 | Wt 288.0 lb

## 2022-09-27 DIAGNOSIS — R0602 Shortness of breath: Secondary | ICD-10-CM | POA: Insufficient documentation

## 2022-09-27 DIAGNOSIS — E669 Obesity, unspecified: Secondary | ICD-10-CM | POA: Diagnosis not present

## 2022-09-27 DIAGNOSIS — I2510 Atherosclerotic heart disease of native coronary artery without angina pectoris: Secondary | ICD-10-CM | POA: Diagnosis not present

## 2022-09-27 DIAGNOSIS — G4733 Obstructive sleep apnea (adult) (pediatric): Secondary | ICD-10-CM | POA: Diagnosis not present

## 2022-09-27 DIAGNOSIS — I480 Paroxysmal atrial fibrillation: Secondary | ICD-10-CM | POA: Insufficient documentation

## 2022-09-27 DIAGNOSIS — Z79899 Other long term (current) drug therapy: Secondary | ICD-10-CM | POA: Insufficient documentation

## 2022-09-27 DIAGNOSIS — I340 Nonrheumatic mitral (valve) insufficiency: Secondary | ICD-10-CM | POA: Diagnosis not present

## 2022-09-27 DIAGNOSIS — I252 Old myocardial infarction: Secondary | ICD-10-CM | POA: Diagnosis not present

## 2022-09-27 DIAGNOSIS — Z955 Presence of coronary angioplasty implant and graft: Secondary | ICD-10-CM | POA: Diagnosis not present

## 2022-09-27 DIAGNOSIS — I493 Ventricular premature depolarization: Secondary | ICD-10-CM | POA: Diagnosis not present

## 2022-09-27 DIAGNOSIS — I5022 Chronic systolic (congestive) heart failure: Secondary | ICD-10-CM | POA: Insufficient documentation

## 2022-09-27 DIAGNOSIS — Z6835 Body mass index (BMI) 35.0-35.9, adult: Secondary | ICD-10-CM | POA: Diagnosis not present

## 2022-09-27 DIAGNOSIS — Z7901 Long term (current) use of anticoagulants: Secondary | ICD-10-CM | POA: Diagnosis not present

## 2022-09-27 LAB — BASIC METABOLIC PANEL
Anion gap: 9 (ref 5–15)
BUN: 17 mg/dL (ref 8–23)
CO2: 22 mmol/L (ref 22–32)
Calcium: 8.8 mg/dL — ABNORMAL LOW (ref 8.9–10.3)
Chloride: 108 mmol/L (ref 98–111)
Creatinine, Ser: 1.13 mg/dL (ref 0.61–1.24)
GFR, Estimated: >60 mL/min (ref >60)
Glucose, Bld: 110 mg/dL — ABNORMAL HIGH (ref 70–99)
Potassium: 4.5 mmol/L (ref 3.5–5.1)
Sodium: 139 mmol/L (ref 135–145)

## 2022-09-27 LAB — BRAIN NATRIURETIC PEPTIDE: B Natriuretic Peptide: 26.4 pg/mL (ref 0.0–100.0)

## 2022-09-27 NOTE — Patient Instructions (Signed)
It was great to see you today! No medication changes are needed at this time.  Labs today We will only contact you if something comes back abnormal or we need to make some changes. Otherwise no news is good news!  You have been referred to [facility]Electrophysiology with Dr Elberta Fortis -they will be in contact with an appointment  Please be sure to schedule follow up  with ophthalmology for routine eye exam.   Your physician recommends that you schedule a follow-up appointment in: 3-4 months with Dr Shirlee Latch and echo  Your physician has requested that you have an echocardiogram. Echocardiography is a painless test that uses sound waves to create images of your heart. It provides your doctor with information about the size and shape of your heart and how well your heart's chambers and valves are working. This procedure takes approximately one hour. There are no restrictions for this procedure. Please do NOT wear cologne, perfume, aftershave, or lotions (deodorant is allowed). Please arrive 15 minutes prior to your appointment time.  Do the following things EVERYDAY: Weigh yourself in the morning before breakfast. Write it down and keep it in a log. Take your medicines as prescribed Eat low salt foods--Limit salt (sodium) to 2000 mg per day.  Stay as active as you can everyday Limit all fluids for the day to less than 2 liters   At the [facility], you and your health needs are our priority. As part of our continuing mission to provide you with exceptional heart care, we have created designated Provider Care Teams. These Care Teams include your primary Cardiologist (physician) and Advanced Practice Providers (APPs- Physician Assistants and Nurse Practitioners) who all work together to provide you with the care you need, when you need it.   You may see any of the following providers on your designated Care Team at your next follow up: Dr Arvilla Meres Dr Marca Ancona Dr.  Marcos Eke, NP Robbie Lis, [place] [facility] [place], [place] Brynda Peon, NP Karle Plumber, PharmD   Please be sure to bring in all your medications bottles to every appointment.   If you have any questions or concerns before your next appointment please send [place] a message through [place] or call our office at [phone].    TO LEAVE A MESSAGE FOR THE NURSE SELECT OPTION 2, PLEASE LEAVE A MESSAGE INCLUDING: YOUR NAME DATE OF BIRTH CALL BACK NUMBER REASON FOR CALL**this is important as we prioritize the call backs  YOU WILL RECEIVE A CALL BACK THE SAME DAY AS LONG AS YOU CALL BEFORE 4:00 PM

## 2022-10-25 ENCOUNTER — Encounter: Payer: Self-pay | Admitting: Cardiology

## 2022-10-25 ENCOUNTER — Ambulatory Visit: Attending: Cardiology | Admitting: Cardiology

## 2022-10-25 ENCOUNTER — Other Ambulatory Visit (HOSPITAL_COMMUNITY): Payer: Self-pay | Admitting: Cardiology

## 2022-10-25 VITALS — BP 114/66 | HR 63 | Ht 76.0 in | Wt 287.0 lb

## 2022-10-25 DIAGNOSIS — I4819 Other persistent atrial fibrillation: Secondary | ICD-10-CM

## 2022-10-25 DIAGNOSIS — D6869 Other thrombophilia: Secondary | ICD-10-CM

## 2022-10-25 DIAGNOSIS — Z79899 Other long term (current) drug therapy: Secondary | ICD-10-CM | POA: Diagnosis not present

## 2022-10-25 DIAGNOSIS — I493 Ventricular premature depolarization: Secondary | ICD-10-CM | POA: Diagnosis not present

## 2022-10-25 MED ORDER — AMIODARONE HCL 200 MG PO TABS: 200.0000 mg | ORAL_TABLET | Freq: Every day | ORAL | 3 refills | Status: DC

## 2022-10-25 NOTE — Patient Instructions (Signed)
Medication Instructions:  Your physician has recommended you make the following change in your medication:  DECREASE Amiodarone to 100 mg daily  *If you need a refill on your cardiac medications before your next appointment, please call your pharmacy*   Lab Work: None ordered   Testing/Procedures: None ordered   Follow-Up: At [facility], you and your health needs are our priority.  As part of our continuing mission to provide you with exceptional heart care, we have created designated Provider Care Teams.  These Care Teams include your primary Cardiologist (physician) and Advanced Practice Providers (APPs -  Physician Assistants and Nurse Practitioners) who all work together to provide you with the care you need, when you need it.  Your next appointment:   1 year(s)  The format for your next appointment:   In Person  Provider:   Allegra Lai, MD    Thank you for choosing [facility]!!   Trinidad Curet, RN [phone]  Other Instructions   Important Information About Sugar
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
## 2022-10-26 MED ORDER — AMIODARONE HCL 100 MG PO TABS: 100.0000 mg | ORAL_TABLET | Freq: Every day | ORAL | 1 refills | Status: DC

## 2022-10-26 NOTE — Telephone Encounter (Signed)
Wife aware Rx sent to pharmacy.  She will let pt know that new Rx sent in.
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
## 2022-11-01 ENCOUNTER — Ambulatory Visit

## 2022-11-01 ENCOUNTER — Encounter: Payer: Self-pay | Admitting: Internal Medicine

## 2022-11-01 ENCOUNTER — Ambulatory Visit: Attending: Internal Medicine | Admitting: Internal Medicine

## 2022-11-01 ENCOUNTER — Other Ambulatory Visit: Payer: Self-pay | Admitting: Internal Medicine

## 2022-11-01 ENCOUNTER — Ambulatory Visit (INDEPENDENT_AMBULATORY_CARE_PROVIDER_SITE_OTHER)

## 2022-11-01 VITALS — BP 95/63 | HR 150 | Resp 16 | Ht 76.0 in | Wt 285.0 lb

## 2022-11-01 DIAGNOSIS — Z7901 Long term (current) use of anticoagulants: Secondary | ICD-10-CM

## 2022-11-01 DIAGNOSIS — M0580 Other rheumatoid arthritis with rheumatoid factor of unspecified site: Secondary | ICD-10-CM

## 2022-11-01 DIAGNOSIS — M25512 Pain in left shoulder: Secondary | ICD-10-CM | POA: Diagnosis not present

## 2022-11-01 DIAGNOSIS — M79641 Pain in right hand: Secondary | ICD-10-CM

## 2022-11-01 DIAGNOSIS — Z79899 Other long term (current) drug therapy: Secondary | ICD-10-CM | POA: Diagnosis not present

## 2022-11-01 DIAGNOSIS — M79642 Pain in left hand: Secondary | ICD-10-CM

## 2022-11-01 DIAGNOSIS — M25511 Pain in right shoulder: Secondary | ICD-10-CM | POA: Diagnosis not present

## 2022-11-01 NOTE — Patient Instructions (Signed)
Methotrexate Tablets What is this medication? METHOTREXATE (METH oh TREX ate) treats inflammatory conditions such as arthritis and psoriasis. It works by decreasing inflammation, which can reduce pain and prevent long-term injury to the joints and skin. It may also be used to treat some types of cancer. It works by slowing down the growth of cancer cells. This medicine may be used for other purposes; ask your health care provider or pharmacist if you have questions. COMMON BRAND NAME(S): Rheumatrex, Trexall What should I tell my care team before I take this medication? They need to know if you have any of these conditions: Fluid in the stomach area or lungs If you often drink alcohol Infection or immune system problems Kidney disease or on hemodialysis Liver disease Low blood counts, like low white cell, platelet, or red cell counts Lung disease Radiation therapy Stomach ulcers Ulcerative colitis An unusual or allergic reaction to methotrexate, other medications, foods, dyes, or preservatives Pregnant or trying to get pregnant Breast-feeding How should I use this medication? Take this medication by mouth with a glass of water. Follow the directions on the prescription label. Take your medication at regular intervals. Do not take it more often than directed. Do not stop taking except on your care team's advice. Make sure you know why you are taking this medication and how often you should take it. If this medication is used for a condition that is not cancer, like arthritis or psoriasis, it should be taken weekly, NOT daily. Taking this medication more often than directed can cause serious side effects, even death. Talk to your care team about safe handling and disposal of this medication. You may need to take special precautions. Talk to your care team about the use of this medication in children. While this medication may be prescribed for selected conditions, precautions do  apply. Overdosage: If you think you have taken too much of this medicine contact a poison control center or emergency room at once. NOTE: This medicine is only for you. Do not share this medicine with others. What if I miss a dose? If you miss a dose, talk with your care team. Do not take double or extra doses. What may interact with this medication? Do not take this medication with any of the following: Acitretin This medication may also interact with the following: Aspirin and aspirin-like medications including salicylates Azathioprine Certain antibiotics like penicillins, tetracycline, and chloramphenicol Certain medications that treat or prevent blood clots like warfarin, apixaban, dabigatran, and rivaroxaban Certain medications for stomach problems like esomeprazole, omeprazole, pantoprazole Cyclosporine Dapsone Diuretics Gold Hydroxychloroquine Live virus vaccines Medications for infection like acyclovir, adefovir, amphotericin B, bacitracin, cidofovir, foscarnet, ganciclovir, gentamicin, pentamidine, vancomycin Mercaptopurine NSAIDs, medications for pain and inflammation, like ibuprofen or naproxen Other cytotoxic agents Pamidronate Pemetrexed Penicillamine Phenylbutazone Phenytoin Probenecid Pyrimethamine Retinoids such as isotretinoin and tretinoin Steroid medications like prednisone or cortisone Sulfonamides like sulfasalazine and trimethoprim/sulfamethoxazole Theophylline Zoledronic acid This list may not describe all possible interactions. Give your health care provider a list of all the medicines, herbs, non-prescription drugs, or dietary supplements you use. Also tell them if you smoke, drink alcohol, or use illegal drugs. Some items may interact with your medicine. What should I watch for while using this medication? Avoid alcoholic drinks. This medication can make you more sensitive to the sun. Keep out of the sun. If you cannot avoid being in the sun, wear  protective clothing and use sunscreen. Do not use sun lamps or tanning beds/booths. You may need   blood work done while you are taking this medication. Call your care team for advice if you get a fever, chills or sore throat, or other symptoms of a cold or flu. Do not treat yourself. This medication decreases your body's ability to fight infections. Try to avoid being around people who are sick. This medication may increase your risk to bruise or bleed. Call your care team if you notice any unusual bleeding. Be careful brushing or flossing your teeth or using a toothpick because you may get an infection or bleed more easily. If you have any dental work done, tell your dentist you are receiving this medication. Check with your care team if you get an attack of severe diarrhea, nausea and vomiting, or if you sweat a lot. The loss of too much body fluid can make it dangerous for you to take this medication. Talk to your care team about your risk of cancer. You may be more at risk for certain types of cancers if you take this medication. Do not become pregnant while taking this medication or for 6 months after stopping it. Women should inform their care team if they wish to become pregnant or think they might be pregnant. Men should not father a child while taking this medication and for 3 months after stopping it. There is potential for serious harm to an unborn child. Talk to your care team for more information. Do not breast-feed an infant while taking this medication or for 1 week after stopping it. This medication may make it more difficult to get pregnant or father a child. Talk to your care team if you are concerned about your fertility. What side effects may I notice from receiving this medication? Side effects that you should report to your care team as soon as possible: Allergic reactions--skin rash, itching, hives, swelling of the face, lips, tongue, or throat Blood clot--pain, swelling, or warmth  in the leg, shortness of breath, chest pain Dry cough, shortness of breath or trouble breathing Infection--fever, chills, cough, sore throat, wounds that don't heal, pain or trouble when passing urine, general feeling of discomfort or being unwell Kidney injury--decrease in the amount of urine, swelling of the ankles, hands, or feet Liver injury--right upper belly pain, loss of appetite, nausea, light-colored stool, dark yellow or brown urine, yellowing of the skin or eyes, unusual weakness or fatigue Low red blood cell count--unusual weakness or fatigue, dizziness, headache, trouble breathing Redness, blistering, peeling, or loosening of the skin, including inside the mouth Seizures Unusual bruising or bleeding Side effects that usually do not require medical attention (report to your care team if they continue or are bothersome): Diarrhea Dizziness Hair loss Nausea Pain, redness, or swelling with sores inside the mouth or throat Vomiting This list may not describe all possible side effects. Call your doctor for medical advice about side effects. You may report side effects to FDA at 1-800-FDA-1088. Where should I keep my medication? Keep out of the reach of children and pets. Store at room temperature between 20 and 25 degrees C (68 and 77 degrees F). Protect from light. Get rid of any unused medication after the expiration date. Talk to your care team about how to dispose of unused medication. Special directions may apply. NOTE: This sheet is a summary. It may not cover all possible information. If you have questions about this medicine, talk to your doctor, pharmacist, or health care provider.  2023 Elsevier/Gold Standard (2020-12-06 00:00:00)

## 2022-11-03 LAB — CBC WITH DIFFERENTIAL/PLATELET
Absolute Monocytes: 429 cells/uL (ref 200–950)
Basophils Absolute: 37 cells/uL (ref 0–200)
Basophils Relative: 0.5 %
Eosinophils Absolute: 52 cells/uL (ref 15–500)
Eosinophils Relative: 0.7 %
HCT: 49.5 % (ref 38.5–50.0)
Hemoglobin: 17 g/dL (ref 13.2–17.1)
Lymphs Abs: 3633 cells/uL (ref 850–3900)
MCH: 30.4 pg (ref 27.0–33.0)
MCHC: 34.3 g/dL (ref 32.0–36.0)
MCV: 88.6 fL (ref 80.0–100.0)
MPV: 9.1 fL (ref 7.5–12.5)
Monocytes Relative: 5.8 %
Neutro Abs: 3249 cells/uL (ref 1500–7800)
Neutrophils Relative %: 43.9 %
Platelets: 306 10*3/uL (ref 140–400)
RBC: 5.59 10*6/uL (ref 4.20–5.80)
RDW: 12.5 % (ref 11.0–15.0)
Total Lymphocyte: 49.1 %
WBC: 7.4 10*3/uL (ref 3.8–10.8)

## 2022-11-03 LAB — C-REACTIVE PROTEIN: CRP: 2.9 mg/L (ref <8.0)

## 2022-11-03 LAB — COMPLETE METABOLIC PANEL WITH GFR
AG Ratio: 2 (calc) (ref 1.0–2.5)
ALT: 51 U/L — ABNORMAL HIGH (ref 9–46)
AST: 23 U/L (ref 10–35)
Albumin: 4.7 g/dL (ref 3.6–5.1)
Alkaline phosphatase (APISO): 73 U/L (ref 35–144)
BUN: 16 mg/dL (ref 7–25)
CO2: 26 mmol/L (ref 20–32)
Calcium: 10.1 mg/dL (ref 8.6–10.3)
Chloride: 104 mmol/L (ref 98–110)
Creat: 1.19 mg/dL (ref 0.70–1.35)
Globulin: 2.4 g/dL (calc) (ref 1.9–3.7)
Glucose, Bld: 105 mg/dL — ABNORMAL HIGH (ref 65–99)
Potassium: 5.6 mmol/L — ABNORMAL HIGH (ref 3.5–5.3)
Sodium: 140 mmol/L (ref 135–146)
Total Bilirubin: 1 mg/dL (ref 0.2–1.2)
Total Protein: 7.1 g/dL (ref 6.1–8.1)
eGFR: 68 mL/min/{1.73_m2} (ref >=60)

## 2022-11-03 LAB — QUANTIFERON-TB GOLD PLUS
Mitogen-NIL: >10 IU/mL
NIL: 0.04 IU/mL
QuantiFERON-TB Gold Plus: NEGATIVE
TB1-NIL: 0 IU/mL
TB2-NIL: 0 IU/mL

## 2022-11-03 LAB — SEDIMENTATION RATE: Sed Rate: 2 mm/h (ref 0–20)

## 2022-11-03 LAB — HEPATITIS B CORE ANTIBODY, IGM: Hep B C IgM: NONREACTIVE

## 2022-11-03 LAB — HEPATITIS B SURFACE ANTIGEN: Hepatitis B Surface Ag: NONREACTIVE

## 2022-11-30 ENCOUNTER — Other Ambulatory Visit (HOSPITAL_COMMUNITY): Payer: Self-pay

## 2022-11-30 MED ORDER — METOPROLOL SUCCINATE ER 100 MG PO TB24: 100.0000 mg | ORAL_TABLET | Freq: Two times a day (BID) | ORAL | 2 refills | Status: DC

## 2022-12-07 ENCOUNTER — Ambulatory Visit: Admitting: Internal Medicine
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
## 2022-12-08 ENCOUNTER — Ambulatory Visit: Attending: Internal Medicine | Admitting: Internal Medicine

## 2022-12-08 ENCOUNTER — Encounter: Payer: Self-pay | Admitting: Internal Medicine

## 2022-12-08 VITALS — BP 103/69 | HR 67 | Resp 16 | Ht 76.0 in | Wt 292.0 lb

## 2022-12-08 DIAGNOSIS — M542 Cervicalgia: Secondary | ICD-10-CM

## 2022-12-08 DIAGNOSIS — M0580 Other rheumatoid arthritis with rheumatoid factor of unspecified site: Secondary | ICD-10-CM

## 2022-12-08 DIAGNOSIS — M25512 Pain in left shoulder: Secondary | ICD-10-CM

## 2022-12-08 DIAGNOSIS — M25511 Pain in right shoulder: Secondary | ICD-10-CM | POA: Diagnosis not present

## 2022-12-08 DIAGNOSIS — M67971 Unspecified disorder of synovium and tendon, right ankle and foot: Secondary | ICD-10-CM

## 2022-12-08 DIAGNOSIS — G8929 Other chronic pain: Secondary | ICD-10-CM

## 2022-12-08 MED ORDER — LIDOCAINE HCL 1 % IJ SOLN
3.0000 mL | INTRAMUSCULAR | Status: AC | PRN
  Administered 2022-12-08: 3 mL

## 2022-12-08 MED ORDER — TRIAMCINOLONE ACETONIDE 40 MG/ML IJ SUSP
40.0000 mg | INTRAMUSCULAR | Status: AC | PRN
  Administered 2022-12-08: 40 mg via INTRA_ARTICULAR

## 2022-12-17 ENCOUNTER — Other Ambulatory Visit (HOSPITAL_COMMUNITY): Payer: Self-pay | Admitting: Cardiology

## 2023-01-02 ENCOUNTER — Encounter (HOSPITAL_COMMUNITY): Payer: Self-pay | Admitting: Cardiology

## 2023-01-02 ENCOUNTER — Ambulatory Visit (HOSPITAL_COMMUNITY)
Admission: RE | Admit: 2023-01-02 | Discharge: 2023-01-02 | Disposition: A | Discharge status: Home / Self Care | Source: Ambulatory Visit | Attending: Cardiology | Admitting: Cardiology

## 2023-01-02 ENCOUNTER — Ambulatory Visit (HOSPITAL_BASED_OUTPATIENT_CLINIC_OR_DEPARTMENT_OTHER)
Admission: RE | Admit: 2023-01-02 | Discharge: 2023-01-02 | Disposition: A | Discharge status: Home / Self Care | Source: Ambulatory Visit | Attending: Cardiology | Admitting: Cardiology

## 2023-01-02 VITALS — BP 90/52 | HR 73 | Wt 291.0 lb

## 2023-01-02 DIAGNOSIS — I480 Paroxysmal atrial fibrillation: Secondary | ICD-10-CM | POA: Insufficient documentation

## 2023-01-02 DIAGNOSIS — Z955 Presence of coronary angioplasty implant and graft: Secondary | ICD-10-CM | POA: Insufficient documentation

## 2023-01-02 DIAGNOSIS — Z79899 Other long term (current) drug therapy: Secondary | ICD-10-CM | POA: Diagnosis not present

## 2023-01-02 DIAGNOSIS — I493 Ventricular premature depolarization: Secondary | ICD-10-CM | POA: Diagnosis not present

## 2023-01-02 DIAGNOSIS — N401 Enlarged prostate with lower urinary tract symptoms: Secondary | ICD-10-CM

## 2023-01-02 DIAGNOSIS — E669 Obesity, unspecified: Secondary | ICD-10-CM | POA: Insufficient documentation

## 2023-01-02 DIAGNOSIS — Z6835 Body mass index (BMI) 35.0-35.9, adult: Secondary | ICD-10-CM | POA: Diagnosis not present

## 2023-01-02 DIAGNOSIS — I340 Nonrheumatic mitral (valve) insufficiency: Secondary | ICD-10-CM | POA: Diagnosis not present

## 2023-01-02 DIAGNOSIS — R3914 Feeling of incomplete bladder emptying: Secondary | ICD-10-CM | POA: Diagnosis not present

## 2023-01-02 DIAGNOSIS — Z7901 Long term (current) use of anticoagulants: Secondary | ICD-10-CM | POA: Insufficient documentation

## 2023-01-02 DIAGNOSIS — Z8249 Family history of ischemic heart disease and other diseases of the circulatory system: Secondary | ICD-10-CM | POA: Diagnosis not present

## 2023-01-02 DIAGNOSIS — I5022 Chronic systolic (congestive) heart failure: Secondary | ICD-10-CM | POA: Diagnosis present

## 2023-01-02 DIAGNOSIS — Z87891 Personal history of nicotine dependence: Secondary | ICD-10-CM | POA: Diagnosis not present

## 2023-01-02 DIAGNOSIS — I2510 Atherosclerotic heart disease of native coronary artery without angina pectoris: Secondary | ICD-10-CM | POA: Diagnosis not present

## 2023-01-02 DIAGNOSIS — G4733 Obstructive sleep apnea (adult) (pediatric): Secondary | ICD-10-CM | POA: Insufficient documentation

## 2023-01-02 LAB — COMPREHENSIVE METABOLIC PANEL
ALT: 45 U/L — ABNORMAL HIGH (ref 0–44)
AST: 23 U/L (ref 15–41)
Albumin: 4 g/dL (ref 3.5–5.0)
Alkaline Phosphatase: 64 U/L (ref 38–126)
Anion gap: 7 (ref 5–15)
BUN: 13 mg/dL (ref 8–23)
CO2: 26 mmol/L (ref 22–32)
Calcium: 9.2 mg/dL (ref 8.9–10.3)
Chloride: 107 mmol/L (ref 98–111)
Creatinine, Ser: 1.1 mg/dL (ref 0.61–1.24)
GFR, Estimated: >60 mL/min (ref >60)
Glucose, Bld: 102 mg/dL — ABNORMAL HIGH (ref 70–99)
Potassium: 4.3 mmol/L (ref 3.5–5.1)
Sodium: 140 mmol/L (ref 135–145)
Total Bilirubin: 1.3 mg/dL — ABNORMAL HIGH (ref 0.3–1.2)
Total Protein: 6.1 g/dL — ABNORMAL LOW (ref 6.5–8.1)

## 2023-01-02 LAB — ECHOCARDIOGRAM COMPLETE
Area-P 1/2: 3.06 cm2
Calc EF: 51 %
Est EF: 50
S' Lateral: 4.1 cm
Single Plane A2C EF: 50.5 %
Single Plane A4C EF: 51 %

## 2023-01-02 LAB — PSA: Prostatic Specific Antigen: 1.99 ng/mL (ref 0.00–4.00)

## 2023-01-02 LAB — TSH: TSH: 1.739 u[IU]/mL (ref 0.350–4.500)

## 2023-01-02 NOTE — Patient Instructions (Signed)
There has been no changes to your medications.  Labs done today, your results will be available in MyChart, we will contact you for abnormal readings.  Repeat blood work in 3 months.  Your physician recommends that you schedule a follow-up appointment in: 6 months ( September) ** please call the office in July to arrange your follow up appointment. **  If you have any questions or concerns before your next appointment please send [place] a message through [place] or call our office at [phone].    TO LEAVE A MESSAGE FOR THE NURSE SELECT OPTION 2, PLEASE LEAVE A MESSAGE INCLUDING: YOUR NAME DATE OF BIRTH CALL BACK NUMBER REASON FOR CALL**this is important as we prioritize the call backs  YOU WILL RECEIVE A CALL BACK THE SAME DAY AS LONG AS YOU CALL BEFORE 4:00 PM  At the [facility], you and your health needs are our priority. As part of our continuing mission to provide you with exceptional heart care, we have created designated Provider Care Teams. These Care Teams include your primary Cardiologist (physician) and Advanced Practice Providers (APPs- Physician Assistants and Nurse Practitioners) who all work together to provide you with the care you need, when you need it.   You may see any of the following providers on your designated Care Team at your next follow up: Dr Glori Bickers Dr Loralie Champagne Dr. Roxana Hires, NP Lyda Jester, [place] [facility] [place], [place] Forestine Na, NP Audry Riles, PharmD   Please be sure to bring in all your medications bottles to every appointment.    Thank you for choosing [facility]

## 2023-01-02 NOTE — Progress Notes (Signed)
PCP: Janora Norlander, DO Cardiology: Dr. Harl Bowie HF Cardiology: Dr. Aundra Dubin  67 y.o.with history of paroxysmal atrial fibrillation, CAD, and chronic systolic CHF was referred by Dr. Harl Bowie for CHF clinic evaluation.    Patient had no cardiac history prior to 2/18.  In 2/18, he developed the onset of exertional dyspnea. After steadily worsening dyspnea, he went to the ER at [facility] and was admitted.  Echo in 2/18 showed EF < 20% with severe MR.  He went back into NSR spontaneously.  He followed up with Dr. Harl Bowie and was set up for a cath in 3/18.  He was found to have a chronically occluded LCx and a long proximal to mid LAD stenosis.  No intervention was done.  He was noted to be back in rapid atrial fibrillation and was admitted.  He was diuresed and started on amiodarone, he converted back to NSR again spontaneously.    He underwent successful CTO procedure in 8/18 with DES to LCx, DES to pLAD, DES to dLAD.    In 10/18, he had atrial fibrillation ablation.  Now off amiodarone.   Cardiac MRI in 12/18 showed EF up to 44%.  Echo in 12/19 showed EF 40%. Echo in 1/21 showed EF 35-40% with diffuse hypokinesis and normal RV.   Patient had a presyncopal episode at home 10/30/20.  He was lightheaded but did not pass out.  On his pulse ox, HR was down to 30.  He went to the ER, HR was in 60s in the ER.  He was sent home.  Echo in 1/22 showed EF 35-40% range with global hypokinesis, mild LVH, mildly decreased RV systolic function.  Cardiac MRI was done again to more closely quantify EF (?ICD), showed LV EF 51%, RVEF 48%, small area of anteroseptal LGE c/w prior MI.   Echo in 1/23 showed EF 45%, mild LVH, mildly decreased RV systolic function. Zio monitor in 8/23 showed 12% PVCs, amiodarone was started. Zio monitor repeated in 10/23 showed 6% PVCs.    Echo was done today and reviewed, EF 50% with normal RV and normal IVC.   He returns for followup of CHF.  He is working part-time now driving a  school bus.  He is using CPAP.  He was unable to tolerate semaglutide.  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  He is sleeping poorly due to neck pain.  He also has knee pain chronically.  No significant exertional dyspnea.  No lightheadedness, no orthopnea/PND.  No chest pain. Weight up 3 lbs.   ECG (personally reviewed): NSR, PVCs, right superior axis, poor RWP  Labs (4/18): K 4.5, creatinine 1.0 Labs (5/18): K 4.2, creatinine 1.25, LFTs normal, TSH normal Labs (6/18): LDL 14, HDL 28 Labs (8/18): K 4.1 => 3.9, creatinine 1.0 => 1.14, LFTs normal, TSH normal Labs (10/18): hgb 13.8, K 4.4, creatinine 1.02 Labs (1/19): hgb 13.6, TSH normal, K 4.6, creatinine 1.05 Labs (02/11/2018): K 4.2 Creatinine 0.99 Labs (9/19): K 4.6, creatinine 0.99, hgb 13.6, LDL 31, HDL 28 Labs (2/20): K 4.5, creatinine 0.86 Labs (7/20): K 4.4, creatinine 1.33 Labs (1/21): K 4.4, creatinine 1.04, LDL 40 Labs (4/21): K 4.5, creatinine 0.99 Labs (1/22): K 4.2, creatinine 0.99, LDL 51, HDL 27 Labs (4/22): K 4.4, creatinine 0.91 Labs (11/22): K 4.9, creatinine 0.98, LDL 25, TGs 164 Labs (6/23): K 4.8, creatinine 1.04 Labs (9/23): LDL 30 Labs (1/24): K 5.6, creatinine 1.19, LFTs normal, hgb 17  PMH: 1. Atrial fibrillation: Paroxysmal.  2. 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  - Echo (2/18) with mild LV dilation, EF < 20%, severe mitral regurgitation.  - RHC/LHC (3/18): long 80% proximal-mid LAD stenosis, total occlusion of the LCx with left to left collaterals, RCA ok. Mean RA 7, PA 32/10, mean PCWP 16, LVEDP 12, CI 1.54.  - Echo (6/18) with EF 30-35%, diffuse hypokinesis, normal RV size and systolic function, trivial MR.  - Echo (12/18): EF 35%, diffuse hypokinesis, normal RV size with mildly decreased systolic function.  - Cardiac MRI (12/18): EF 44%, small area of LGE in mid anteroseptal wall suggestive of prior  infarction. - Echo (12/19): EF 40%, diffuse hypokinesis, normal RV size with mildly decreased systolic function.    - Echo (1/21): EF 35-40%, diffuse hypokinesis, normal RV size and systolic function.  - Echo (1/22): EF 35-40% range with global hypokinesis, mild LVH, mildly decreased RV systolic function. - Cardiac MRI (3/22): LV EF 51%, RVEF 48%, small area of anteroseptal LGE c/w prior MI.  - Echo (1/23): EF 45%, mild LVH, mildly decreased RV systolic function.  - Echo (3/24): EF 50% with normal RV and normal IVC 3. CAD: LHC (3/18) with long 80% proximal-mid LAD stenosis, total occlusion of the LCx with left to left collaterals, RCA ok. - 8/18: CTO intervention with DES to LCx and DES x 2 to proximal and mid LAD.  4. Mitral regurgitation: Severe on 2/18 echo.  Suspect functional, as MR only trivial on 6/18 echo.  5. Sleep study (6/18) with no OSA but nocturnal hypoxemia.  He was instructed to start 2 liters oxygen at night but his insurance would not cover.   6. Allergic rhinitis 7. PVCs - Zio patch 12/19: 1.1% PVCs, 4 runs NSVT, longest 10 beats.  - Zio patch 2/22: 6% PVCs, 13 runs NSVT, longest 15 beats - Zio patch 8/23: 12% PVCs - Zio patch 10/23: 6% PVCs 8. Polyarthritis with +RF 9. OSA: CPAP  SH: Bus driver and school custodian, lives in [place], quit smoking in 2011, married.   FH: Grandfather with MIs, mother with CABG and CHF.   Review of systems complete and found to be negative unless listed in HPI.    Current Outpatient Medications  Medication Sig Dispense Refill   acetaminophen (TYLENOL) 500 MG tablet Take 1,000 mg by mouth every 6 (six) hours as needed for moderate pain, mild pain or headache.     amiodarone (PACERONE) 100 MG tablet Take 1 tablet (100 mg total) by mouth daily. 90 tablet 1   atorvastatin (LIPITOR) 80 MG tablet TAKE 1 TABLET DAILY AT 6 P.M. 90 tablet 3   ELIQUIS 5 MG TABS tablet TAKE 1 TABLET TWICE A DAY 180 tablet 3   ENTRESTO 97-103 MG TAKE 1 TABLET  TWICE A DAY 180 tablet 3   finasteride (PROSCAR) 5 MG tablet Take 1 tablet (5 mg total) by mouth daily. 90 tablet 0   fluticasone (FLONASE) 50 MCG/ACT nasal spray Place 1 spray into both nostrils daily as needed for rhinitis.     furosemide (LASIX) 20 MG tablet TAKE 1 TABLET DAILY AS NEEDED 90 tablet 3   JARDIANCE 10 MG TABS tablet TAKE 1 TABLET DAILY BEFORE BREAKFAST 90 tablet 3   metoprolol succinate (TOPROL-XL) 100 MG 24 hr tablet Take 1 tablet (100 mg total) by mouth 2 (two) times daily. Take with or immediately following a meal. 180 tablet 2   spironolactone (ALDACTONE) 25 MG tablet TAKE 1  TABLET AT BEDTIME 90 tablet 3   Turmeric 500 MG TABS Take 1 tablet by mouth at bedtime.     Vitamin D3 (VITAMIN D) 25 MCG tablet Take 1,000 Units by mouth 4 (four) times a week. Sat, Sun, Tues and Thursday     No current facility-administered medications for this encounter.   BP (!) 90/52   Pulse 73   Wt 132 kg (291 lb)   SpO2 95%   BMI 35.42 kg/m    Wt Readings from Last 3 Encounters:  01/02/23 132 kg (291 lb)  12/08/22 132.5 kg (292 lb)  11/01/22 129.3 kg (285 lb)    General: NAD Neck: Thick neck. No JVD, no thyromegaly or thyroid nodule.  Lungs: Clear to auscultation bilaterally with normal respiratory effort. CV: Nondisplaced PMI.  Heart regular S1/S2, no S3/S4, no murmur.  1+ ankle edema.  No carotid bruit.  Normal pedal pulses.  Abdomen: Soft, nontender, no hepatosplenomegaly, no distention.  Skin: Intact without lesions or rashes.  Neurologic: Alert and oriented x 3.  Psych: Normal affect. Extremities: No clubbing or cyanosis.  HEENT: Normal.   Assessment/Plan: 1. Atrial fibrillation: Paroxysmal.  Now s/p atrial fibrillation ablation in 10/18.  NSR today.  He is on amiodarone (for PVCs primarily).  - Continue Eliquis for anticoagulation. CBC today.   2. Chronic systolic CHF: Suspect mixed ischemic and nonischemic (tachycardia-mediated) cardiomyopathy, frequent PVCs could also play  a role.  Echo in 2/18 with EF < 20%.  EF 30-35% in 6/18 and 35% on repeat echo 12/18. Cardiac MRI in 12/18 to determine need for ICD showed EF 44%. Echo in 1/21 showed EF 35-40%.  Echo in 1/22 with EF about 35-40%.  Cardiac MRI in 3/22 showed LV EF up to 51%.  Echo in 1/23 with EF 45%.  Echo today with EF up to 50%.  NYHA class I-II symptoms.  He is not volume overloaded on exam.  - Continue Toprol XL 100 mg bid.  - Continue Entresto 97/103 bid.   - Continue spironolactone 25 daily. BMET today, K was recently high.  - Continue empagliflozin.  - Continue to use Lasix only prn.  3. CAD: s/p DES to LCx and LAD in 05/2017.  No chest pain.  - No ASA given stable CAD and use of Eliquis.  - Continue statin. Good lipids 9/23.  4. Mitral regurgitation: Minimal on today's echo.  5. PVCs: 12% on 8/23 Zio patch, now back on amiodarone. Zio in 10/23 with PVCs down to 6%.  - Continue Toprol XL.  - Continue amiodarone 100 mg daily.  Check LFTs/TSH today, will need regular eye exam.  6. OSA: Using CPAP.  7. Obesity: He did not tolerate semaglutide.   Followup in 6 months with APP.  Will need BMET in 3 months.   Loralie Champagne 01/02/2023

## 2023-01-03 MED ORDER — TRAZODONE HCL 50 MG PO TABS: 50.0000 mg | ORAL_TABLET | Freq: Every day | ORAL | 0 refills | Status: DC

## 2023-01-10 ENCOUNTER — Encounter: Payer: Self-pay | Admitting: Cardiology

## 2023-01-18 ENCOUNTER — Other Ambulatory Visit (HOSPITAL_COMMUNITY): Payer: Self-pay | Admitting: Neurosurgery

## 2023-01-18 DIAGNOSIS — G959 Disease of spinal cord, unspecified: Secondary | ICD-10-CM

## 2023-02-14 ENCOUNTER — Ambulatory Visit (HOSPITAL_COMMUNITY)
Admission: RE | Admit: 2023-02-14 | Discharge: 2023-02-14 | Disposition: A | Discharge status: Home / Self Care | Source: Ambulatory Visit | Attending: Neurosurgery | Admitting: Neurosurgery

## 2023-02-14 DIAGNOSIS — G959 Disease of spinal cord, unspecified: Secondary | ICD-10-CM | POA: Diagnosis present

## 2023-02-16 ENCOUNTER — Other Ambulatory Visit (HOSPITAL_COMMUNITY): Payer: Self-pay | Admitting: Orthopaedic Surgery

## 2023-02-16 DIAGNOSIS — M25571 Pain in right ankle and joints of right foot: Secondary | ICD-10-CM

## 2023-02-16 DIAGNOSIS — M25572 Pain in left ankle and joints of left foot: Secondary | ICD-10-CM

## 2023-02-19 MED ORDER — AMIODARONE HCL 100 MG PO TABS: 100.0000 mg | ORAL_TABLET | Freq: Every day | ORAL | 2 refills | Status: DC

## 2023-02-19 NOTE — Addendum Note (Signed)
Addended by: Margaret Pyle D on: 02/19/2023 08:36 AM   Modules accepted: Orders

## 2023-03-03 ENCOUNTER — Ambulatory Visit (HOSPITAL_COMMUNITY)
Admission: RE | Admit: 2023-03-03 | Discharge: 2023-03-03 | Disposition: A | Discharge status: Home / Self Care | Source: Ambulatory Visit | Attending: Orthopaedic Surgery | Admitting: Orthopaedic Surgery

## 2023-03-03 DIAGNOSIS — M25571 Pain in right ankle and joints of right foot: Secondary | ICD-10-CM

## 2023-03-03 DIAGNOSIS — M25572 Pain in left ankle and joints of left foot: Secondary | ICD-10-CM | POA: Diagnosis present

## 2023-03-13 ENCOUNTER — Ambulatory Visit: Admitting: Internal Medicine

## 2023-03-20 ENCOUNTER — Other Ambulatory Visit (HOSPITAL_COMMUNITY): Payer: Self-pay | Admitting: Cardiology

## 2023-03-20 ENCOUNTER — Other Ambulatory Visit

## 2023-03-21 LAB — BASIC METABOLIC PANEL
BUN/Creatinine Ratio: 10 (ref 10–24)
BUN: 13 mg/dL (ref 8–27)
CO2: 22 mmol/L (ref 20–29)
Calcium: 9.8 mg/dL (ref 8.6–10.2)
Chloride: 104 mmol/L (ref 96–106)
Creatinine, Ser: 1.28 mg/dL — ABNORMAL HIGH (ref 0.76–1.27)
Glucose: 143 mg/dL — ABNORMAL HIGH (ref 70–99)
Potassium: 4.3 mmol/L (ref 3.5–5.2)
Sodium: 139 mmol/L (ref 134–144)
eGFR: 62 mL/min/{1.73_m2} (ref >59)

## 2023-03-27 DIAGNOSIS — G5600 Carpal tunnel syndrome, unspecified upper limb: Secondary | ICD-10-CM | POA: Insufficient documentation

## 2023-04-02 ENCOUNTER — Other Ambulatory Visit (HOSPITAL_COMMUNITY): Payer: Self-pay | Admitting: Cardiology

## 2023-05-16 ENCOUNTER — Ambulatory Visit: Admitting: Family Medicine

## 2023-06-14 ENCOUNTER — Other Ambulatory Visit (HOSPITAL_COMMUNITY): Payer: Self-pay | Admitting: Cardiology

## 2023-06-27 ENCOUNTER — Other Ambulatory Visit (HOSPITAL_COMMUNITY): Payer: Self-pay

## 2023-06-27 MED ORDER — METOPROLOL SUCCINATE ER 100 MG PO TB24: 100.0000 mg | ORAL_TABLET | Freq: Two times a day (BID) | ORAL | 2 refills | Status: DC

## 2023-07-02 DIAGNOSIS — G5620 Lesion of ulnar nerve, unspecified upper limb: Secondary | ICD-10-CM | POA: Insufficient documentation

## 2023-07-11 ENCOUNTER — Encounter (HOSPITAL_COMMUNITY): Payer: Self-pay | Admitting: Cardiology

## 2023-07-11 ENCOUNTER — Ambulatory Visit (HOSPITAL_COMMUNITY)
Admission: RE | Admit: 2023-07-11 | Discharge: 2023-07-11 | Disposition: A | Discharge status: Home / Self Care | Payer: Medicare Other | Source: Ambulatory Visit | Attending: Cardiology | Admitting: Cardiology

## 2023-07-11 VITALS — BP 100/60 | HR 60 | Wt 269.0 lb

## 2023-07-11 DIAGNOSIS — I480 Paroxysmal atrial fibrillation: Secondary | ICD-10-CM | POA: Insufficient documentation

## 2023-07-11 DIAGNOSIS — R0600 Dyspnea, unspecified: Secondary | ICD-10-CM | POA: Diagnosis not present

## 2023-07-11 DIAGNOSIS — Z955 Presence of coronary angioplasty implant and graft: Secondary | ICD-10-CM | POA: Diagnosis not present

## 2023-07-11 DIAGNOSIS — E669 Obesity, unspecified: Secondary | ICD-10-CM | POA: Diagnosis not present

## 2023-07-11 DIAGNOSIS — I2510 Atherosclerotic heart disease of native coronary artery without angina pectoris: Secondary | ICD-10-CM | POA: Diagnosis not present

## 2023-07-11 DIAGNOSIS — R5383 Other fatigue: Secondary | ICD-10-CM | POA: Insufficient documentation

## 2023-07-11 DIAGNOSIS — G4733 Obstructive sleep apnea (adult) (pediatric): Secondary | ICD-10-CM | POA: Diagnosis not present

## 2023-07-11 DIAGNOSIS — Z79899 Other long term (current) drug therapy: Secondary | ICD-10-CM | POA: Diagnosis not present

## 2023-07-11 DIAGNOSIS — Z6832 Body mass index (BMI) 32.0-32.9, adult: Secondary | ICD-10-CM | POA: Diagnosis not present

## 2023-07-11 DIAGNOSIS — I5022 Chronic systolic (congestive) heart failure: Secondary | ICD-10-CM | POA: Diagnosis not present

## 2023-07-11 DIAGNOSIS — Z7901 Long term (current) use of anticoagulants: Secondary | ICD-10-CM | POA: Diagnosis not present

## 2023-07-11 DIAGNOSIS — I340 Nonrheumatic mitral (valve) insufficiency: Secondary | ICD-10-CM | POA: Insufficient documentation

## 2023-07-11 DIAGNOSIS — I493 Ventricular premature depolarization: Secondary | ICD-10-CM | POA: Diagnosis not present

## 2023-07-11 LAB — COMPREHENSIVE METABOLIC PANEL
ALT: 42 U/L (ref 0–44)
AST: 25 U/L (ref 15–41)
Albumin: 4.1 g/dL (ref 3.5–5.0)
Alkaline Phosphatase: 74 U/L (ref 38–126)
Anion gap: 9 (ref 5–15)
BUN: 13 mg/dL (ref 8–23)
CO2: 26 mmol/L (ref 22–32)
Calcium: 9.1 mg/dL (ref 8.9–10.3)
Chloride: 106 mmol/L (ref 98–111)
Creatinine, Ser: 1.18 mg/dL (ref 0.61–1.24)
GFR, Estimated: >60 mL/min (ref >60)
Glucose, Bld: 94 mg/dL (ref 70–99)
Potassium: 4.2 mmol/L (ref 3.5–5.1)
Sodium: 141 mmol/L (ref 135–145)
Total Bilirubin: 1.3 mg/dL — ABNORMAL HIGH (ref 0.3–1.2)
Total Protein: 6.4 g/dL — ABNORMAL LOW (ref 6.5–8.1)

## 2023-07-11 LAB — TSH: TSH: 1.93 u[IU]/mL (ref 0.350–4.500)

## 2023-07-11 NOTE — Patient Instructions (Addendum)
There has been no changes to your medications.  Labs done today, your results will be available in MyChart, we will contact you for abnormal readings.   Your physician has requested that you have an echocardiogram. Echocardiography is a painless test that uses sound waves to create images of your heart. It provides your doctor with information about the size and shape of your heart and how well your heart's chambers and valves are working. This procedure takes approximately one hour. There are no restrictions for this procedure. Please do NOT wear cologne, perfume, aftershave, or lotions (deodorant is allowed). Please arrive 15 minutes prior to your appointment time.  Your physician recommends that you schedule a follow-up appointment in: 6 months with an echocardiogram (March 2025) ** PLEASE CALL THE OFFICE IN [place] TO ARRANGE YOUR FOLLOW UP APPOINTMENT. **   If you have any questions or concerns before your next appointment please send [place] a message through [place] or call our office at [phone].    TO LEAVE A MESSAGE FOR THE NURSE SELECT OPTION 2, PLEASE LEAVE A MESSAGE INCLUDING: YOUR NAME DATE OF BIRTH CALL BACK NUMBER REASON FOR CALL**this is important as we prioritize the call backs  YOU WILL RECEIVE A CALL BACK THE SAME DAY AS LONG AS YOU CALL BEFORE 4:00 PM  At the [facility], you and your health needs are our priority. As part of our continuing mission to provide you with exceptional heart care, we have created designated Provider Care Teams. These Care Teams include your primary Cardiologist (physician) and Advanced Practice Providers (APPs- Physician Assistants and Nurse Practitioners) who all work together to provide you with the care you need, when you need it.   You may see any of the following providers on your designated Care Team at your next follow up: Dr Arvilla Meres Dr Marca Ancona Dr. Marcos Eke, NP Robbie Lis,  [place] [facility] [place], [place] Brynda Peon, NP Karle Plumber, PharmD   Please be sure to bring in all your medications bottles to every appointment.    Thank you for choosing [facility]

## 2023-07-12 NOTE — Progress Notes (Signed)
PCP: Raliegh Ip, DO Cardiology: Dr. Wyline Mood HF Cardiology: Dr. Shirlee Latch  67 y.o.with history of paroxysmal atrial fibrillation, CAD, and chronic systolic CHF was referred by Dr. Wyline Mood for CHF clinic evaluation.    Patient had no cardiac history prior to 2/18.  In 2/18, he developed the onset of exertional dyspnea. After steadily worsening dyspnea, he went to the ER at [facility] and was admitted.  Echo in 2/18 showed EF < 20% with severe MR.  He went back into NSR spontaneously.  He followed up with Dr. Wyline Mood and was set up for a cath in 3/18.  He was found to have a chronically occluded LCx and a long proximal to mid LAD stenosis.  No intervention was done.  He was noted to be back in rapid atrial fibrillation and was admitted.  He was diuresed and started on amiodarone, he converted back to NSR again spontaneously.    He underwent successful CTO procedure in 8/18 with DES to LCx, DES to pLAD, DES to dLAD.    In 10/18, he had atrial fibrillation ablation.  Now off amiodarone.   Cardiac MRI in 12/18 showed EF up to 44%.  Echo in 12/19 showed EF 40%. Echo in 1/21 showed EF 35-40% with diffuse hypokinesis and normal RV.   Patient had a presyncopal episode at home 10/30/20.  He was lightheaded but did not pass out.  On his pulse ox, HR was down to 30.  He went to the ER, HR was in 60s in the ER.  He was sent home.  Echo in 1/22 showed EF 35-40% range with global hypokinesis, mild LVH, mildly decreased RV systolic function.  Cardiac MRI was done again to more closely quantify EF (?ICD), showed LV EF 51%, RVEF 48%, small area of anteroseptal LGE c/w prior MI.   Echo in 1/23 showed EF 45%, mild LVH, mildly decreased RV systolic function. Zio monitor in 8/23 showed 12% PVCs, amiodarone was started. Zio monitor repeated in 10/23 showed 6% PVCs.    Echo in 3/24 showed EF 50% with normal RV and normal IVC.   He returns for followup of CHF.  He is working part-time now driving a school bus.  He  is using CPAP.  He has lost 22 lbs since last appointment.  Unfortunately, his wife was diagnosed with lung cancer and is undergoing treatment.  He gets fatigued by the end of the day.  He has dyspnea with heavy exertion only, does fine with ADLs.  No chest pain.  No orthopnea/PND.  No lightheadedness.  He is not using Lasix.   ECG (personally reviewed): NSR, right superior axis, low voltage  Labs (4/18): K 4.5, creatinine 1.0 Labs (5/18): K 4.2, creatinine 1.25, LFTs normal, TSH normal Labs (6/18): LDL 14, HDL 28 Labs (8/18): K 4.1 => 3.9, creatinine 1.0 => 1.14, LFTs normal, TSH normal Labs (10/18): hgb 13.8, K 4.4, creatinine 1.02 Labs (1/19): hgb 13.6, TSH normal, K 4.6, creatinine 1.05 Labs (02/11/2018): K 4.2 Creatinine 0.99 Labs (9/19): K 4.6, creatinine 0.99, hgb 13.6, LDL 31, HDL 28 Labs (2/20): K 4.5, creatinine 0.86 Labs (7/20): K 4.4, creatinine 1.33 Labs (1/21): K 4.4, creatinine 1.04, LDL 40 Labs (4/21): K 4.5, creatinine 0.99 Labs (1/22): K 4.2, creatinine 0.99, LDL 51, HDL 27 Labs (4/22): K 4.4, creatinine 0.91 Labs (11/22): K 4.9, creatinine 0.98, LDL 25, TGs 164 Labs (6/23): K 4.8, creatinine 1.04 Labs (9/23): LDL 30 Labs (1/24): K 5.6, creatinine 1.19, LFTs normal, hgb  17 Labs (6/24): K 4.3, creatinine 1.28  PMH: 1. Atrial fibrillation: Paroxysmal.  2. 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  - Echo (2/18) with mild LV dilation, EF < 20%, severe mitral regurgitation.  - RHC/LHC (3/18): long 80% proximal-mid LAD stenosis, total occlusion of the LCx with left to left collaterals, RCA ok. Mean RA 7, PA 32/10, mean PCWP 16, LVEDP 12, CI 1.54.  - Echo (6/18) with EF 30-35%, diffuse hypokinesis, normal RV size and systolic function, trivial MR.  - Echo (12/18): EF 35%, diffuse hypokinesis, normal RV size with mildly decreased systolic function.  - Cardiac MRI (12/18): EF 44%,  small area of LGE in mid anteroseptal wall suggestive of prior infarction. - Echo (12/19): EF 40%, diffuse hypokinesis, normal RV size with mildly decreased systolic function.    - Echo (1/61): EF 35-40%, diffuse hypokinesis, normal RV size and systolic function.  - Echo (1/22): EF 35-40% range with global hypokinesis, mild LVH, mildly decreased RV systolic function. - Cardiac MRI (3/22): LV EF 51%, RVEF 48%, small area of anteroseptal LGE c/w prior MI.  - Echo (1/23): EF 45%, mild LVH, mildly decreased RV systolic function.  - Echo (3/24): EF 50% with normal RV and normal IVC 3. CAD: LHC (3/18) with long 80% proximal-mid LAD stenosis, total occlusion of the LCx with left to left collaterals, RCA ok. - 8/18: CTO intervention with DES to LCx and DES x 2 to proximal and mid LAD.  4. Mitral regurgitation: Severe on 2/18 echo.  Suspect functional, as MR only trivial on 6/18 echo.  5. Sleep study (6/18) with no OSA but nocturnal hypoxemia.  He was instructed to start 2 liters oxygen at night but his insurance would not cover.   6. Allergic rhinitis 7. PVCs - Zio patch 12/19: 1.1% PVCs, 4 runs NSVT, longest 10 beats.  - Zio patch 2/22: 6% PVCs, 13 runs NSVT, longest 15 beats - Zio patch 8/23: 12% PVCs - Zio patch 10/23: 6% PVCs 8. Polyarthritis with +RF 9. OSA: CPAP  SH: Bus driver and school custodian, lives in [place], quit smoking in 2011, married.   FH: Grandfather with MIs, mother with CABG and CHF.   Review of systems complete and found to be negative unless listed in HPI.    Current Outpatient Medications  Medication Sig Dispense Refill   acetaminophen (TYLENOL) 500 MG tablet Take 1,000 mg by mouth every 6 (six) hours as needed for moderate pain, mild pain or headache.     amiodarone (PACERONE) 100 MG tablet Take 1 tablet (100 mg total) by mouth daily. 90 tablet 2   atorvastatin (LIPITOR) 80 MG tablet TAKE 1 TABLET DAILY AT 6 P.M. 90 tablet 3   ELIQUIS 5 MG TABS tablet TAKE 1  TABLET TWICE A DAY 180 tablet 3   ENTRESTO 97-103 MG TAKE 1 TABLET TWICE A DAY 180 tablet 3   finasteride (PROSCAR) 5 MG tablet Take 1 tablet (5 mg total) by mouth daily. 90 tablet 0   fluticasone (FLONASE) 50 MCG/ACT nasal spray Place 1 spray into both nostrils daily as needed for rhinitis.     furosemide (LASIX) 20 MG tablet TAKE 1 TABLET DAILY AS NEEDED 90 tablet 3   JARDIANCE 10 MG TABS tablet TAKE 1 TABLET DAILY BEFORE BREAKFAST 90 tablet 3   metoprolol succinate (TOPROL-XL) 100 MG 24 hr tablet Take 1 tablet (100 mg total) by mouth 2 (two) times daily. Take with or  immediately following a meal. 180 tablet 2   spironolactone (ALDACTONE) 25 MG tablet TAKE 1 TABLET AT BEDTIME 90 tablet 3   Vitamin D3 (VITAMIN D) 25 MCG tablet Take 1,000 Units by mouth 4 (four) times a week. Sat, Sun, Tues and Thursday     No current facility-administered medications for this encounter.   BP 100/60   Pulse 60   Wt 122 kg (269 lb)   SpO2 96%   BMI 32.74 kg/m    Wt Readings from Last 3 Encounters:  07/11/23 122 kg (269 lb)  01/02/23 132 kg (291 lb)  12/08/22 132.5 kg (292 lb)    General: NAD Neck: No JVD, no thyromegaly or thyroid nodule.  Lungs: Clear to auscultation bilaterally with normal respiratory effort. CV: Nondisplaced PMI.  Heart regular S1/S2, no S3/S4, no murmur.  No peripheral edema.  No carotid bruit.  Normal pedal pulses.  Abdomen: Soft, nontender, no hepatosplenomegaly, no distention.  Skin: Intact without lesions or rashes.  Neurologic: Alert and oriented x 3.  Psych: Normal affect. Extremities: No clubbing or cyanosis.  HEENT: Normal.   Assessment/Plan: 1. Atrial fibrillation: Paroxysmal.  Now s/p atrial fibrillation ablation in 10/18.  NSR today.  He is on amiodarone (for PVCs primarily).  - Continue Eliquis for anticoagulation.    2. 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  Echo  in 2/18 with EF < 20%.  EF 30-35% in 6/18 and 35% on repeat echo 12/18. Cardiac MRI in 12/18 to determine need for ICD showed EF 44%. Echo in 1/21 showed EF 35-40%.  Echo in 1/22 with EF about 35-40%.  Cardiac MRI in 3/22 showed LV EF up to 51%.  Echo in 1/23 with EF 45%.  Echo in 3/24 with EF up to 50%.  NYHA class I-II symptoms.  He is not volume overloaded on exam.  - Continue Toprol XL 100 mg bid.  - Continue Entresto 97/103 bid.   - Continue spironolactone 25 daily. BMET today.  - Continue empagliflozin.  - Continue to use Lasix only prn.  - Repeat echo at followup in 6 months.  3. CAD: s/p DES to LCx and LAD in 05/2017.  No chest pain.  - No ASA given stable CAD and use of Eliquis.  - Continue statin.  4. Mitral regurgitation: Minimal on 3/24 echo.  5. PVCs: 12% on 8/23 Zio patch, now back on amiodarone. Zio in 10/23 with PVCs down to 6%.  - Continue Toprol XL.  - Continue amiodarone 100 mg daily.  Check LFTs/TSH today, will need regular eye exam.  6. OSA: Using CPAP.  7. Obesity: He did not tolerate semaglutide.   Followup in 6 months with echo.   Marca Ancona 07/12/2023

## 2023-07-22 ENCOUNTER — Encounter (HOSPITAL_COMMUNITY): Payer: Self-pay | Admitting: Cardiology

## 2023-07-23 ENCOUNTER — Other Ambulatory Visit (HOSPITAL_COMMUNITY): Payer: Self-pay

## 2023-07-23 MED ORDER — EMPAGLIFLOZIN 10 MG PO TABS: 10.0000 mg | ORAL_TABLET | Freq: Every day | ORAL | 3 refills | Status: DC

## 2023-08-29 ENCOUNTER — Other Ambulatory Visit (HOSPITAL_COMMUNITY): Payer: Self-pay | Admitting: Cardiology

## 2023-11-07 ENCOUNTER — Encounter: Payer: Self-pay | Admitting: Cardiology

## 2023-11-07 ENCOUNTER — Other Ambulatory Visit (HOSPITAL_COMMUNITY): Payer: Self-pay | Admitting: Cardiology

## 2023-11-08 ENCOUNTER — Telehealth: Payer: Self-pay

## 2023-11-08 MED ORDER — AMIODARONE HCL 100 MG PO TABS: 100.0000 mg | ORAL_TABLET | Freq: Every day | ORAL | 3 refills | Status: DC

## 2023-11-08 NOTE — Telephone Encounter (Signed)
Refills addressd

## 2024-01-10 ENCOUNTER — Ambulatory Visit (HOSPITAL_COMMUNITY)
Admission: RE | Admit: 2024-01-10 | Discharge: 2024-01-10 | Disposition: A | Discharge status: Home / Self Care | Payer: TRICARE For Life (TFL) | Source: Ambulatory Visit | Attending: Cardiology | Admitting: Cardiology

## 2024-01-10 ENCOUNTER — Ambulatory Visit (HOSPITAL_BASED_OUTPATIENT_CLINIC_OR_DEPARTMENT_OTHER)
Admission: RE | Admit: 2024-01-10 | Discharge: 2024-01-10 | Disposition: A | Discharge status: Home / Self Care | Payer: TRICARE For Life (TFL) | Source: Ambulatory Visit | Attending: Cardiology | Admitting: Cardiology

## 2024-01-10 ENCOUNTER — Encounter (HOSPITAL_COMMUNITY): Payer: Self-pay | Admitting: Cardiology

## 2024-01-10 VITALS — BP 100/64 | HR 67 | Wt 275.0 lb

## 2024-01-10 DIAGNOSIS — N401 Enlarged prostate with lower urinary tract symptoms: Secondary | ICD-10-CM | POA: Diagnosis not present

## 2024-01-10 DIAGNOSIS — I77819 Aortic ectasia, unspecified site: Secondary | ICD-10-CM | POA: Insufficient documentation

## 2024-01-10 DIAGNOSIS — I517 Cardiomegaly: Secondary | ICD-10-CM | POA: Diagnosis not present

## 2024-01-10 DIAGNOSIS — I5022 Chronic systolic (congestive) heart failure: Secondary | ICD-10-CM

## 2024-01-10 DIAGNOSIS — R3914 Feeling of incomplete bladder emptying: Secondary | ICD-10-CM | POA: Diagnosis not present

## 2024-01-10 LAB — LIPID PANEL
Cholesterol: 100 mg/dL (ref 0–200)
HDL: 29 mg/dL — ABNORMAL LOW (ref >40)
LDL Cholesterol: 38 mg/dL (ref 0–99)
Total CHOL/HDL Ratio: 3.4 ratio
Triglycerides: 166 mg/dL — ABNORMAL HIGH (ref <150)
VLDL: 33 mg/dL (ref 0–40)

## 2024-01-10 LAB — COMPREHENSIVE METABOLIC PANEL WITH GFR
ALT: 49 U/L — ABNORMAL HIGH (ref 0–44)
AST: 31 U/L (ref 15–41)
Albumin: 4 g/dL (ref 3.5–5.0)
Alkaline Phosphatase: 60 U/L (ref 38–126)
Anion gap: 7 (ref 5–15)
BUN: 10 mg/dL (ref 8–23)
CO2: 27 mmol/L (ref 22–32)
Calcium: 9.3 mg/dL (ref 8.9–10.3)
Chloride: 105 mmol/L (ref 98–111)
Creatinine, Ser: 1.11 mg/dL (ref 0.61–1.24)
GFR, Estimated: >60 mL/min (ref >60)
Glucose, Bld: 103 mg/dL — ABNORMAL HIGH (ref 70–99)
Potassium: 4.3 mmol/L (ref 3.5–5.1)
Sodium: 139 mmol/L (ref 135–145)
Total Bilirubin: 1.4 mg/dL — ABNORMAL HIGH (ref 0.0–1.2)
Total Protein: 6.6 g/dL (ref 6.5–8.1)

## 2024-01-10 LAB — ECHOCARDIOGRAM COMPLETE
AR max vel: 2.59 cm2
AV Area VTI: 2.77 cm2
AV Area mean vel: 2.5 cm2
AV Mean grad: 2 mmHg
AV Peak grad: 3.9 mmHg
Ao pk vel: 0.98 m/s
Area-P 1/2: 2.87 cm2
Calc EF: 43.4 %
Est EF: 50
S' Lateral: 4 cm
Single Plane A2C EF: 42.5 %
Single Plane A4C EF: 46.7 %

## 2024-01-10 LAB — BRAIN NATRIURETIC PEPTIDE: B Natriuretic Peptide: 31.2 pg/mL (ref 0.0–100.0)

## 2024-01-10 LAB — TSH: TSH: 1.79 u[IU]/mL (ref 0.350–4.500)

## 2024-01-10 NOTE — Patient Instructions (Signed)
 There has been no changes to your medications.  Labs done today, your results will be available in MyChart, we will contact you for abnormal readings.  Your physician recommends that you schedule a follow-up appointment in: 6 months.  If you have any questions or concerns before your next appointment please send [place] a message through [place] or call our office at [phone].    TO LEAVE A MESSAGE FOR THE NURSE SELECT OPTION 2, PLEASE LEAVE A MESSAGE INCLUDING: YOUR NAME DATE OF BIRTH CALL BACK NUMBER REASON FOR CALL**this is important as we prioritize the call backs  YOU WILL RECEIVE A CALL BACK THE SAME DAY AS LONG AS YOU CALL BEFORE 4:00 PM  At the [facility], you and your health needs are our priority. As part of our continuing mission to provide you with exceptional heart care, we have created designated Provider Care Teams. These Care Teams include your primary Cardiologist (physician) and Advanced Practice Providers (APPs- Physician Assistants and Nurse Practitioners) who all work together to provide you with the care you need, when you need it.   You may see any of the following providers on your designated Care Team at your next follow up: Dr Arvilla Meres Dr Marca Ancona Dr. Dorthula Nettles Dr. Clearnce Hasten Amy Filbert Schilder, NP Robbie Lis, [place] [facility] [place], [place] Brynda Peon, NP [place] Lee, NP Clarisa Kindred, NP Karle Plumber, PharmD Enos Fling, PharmD   Please be sure to bring in all your medications bottles to every appointment.    Thank you for choosing [facility]

## 2024-01-11 ENCOUNTER — Telehealth (HOSPITAL_COMMUNITY): Payer: Self-pay

## 2024-01-11 DIAGNOSIS — I5022 Chronic systolic (congestive) heart failure: Secondary | ICD-10-CM

## 2024-01-11 LAB — PSA: Prostatic Specific Antigen: 2.11 ng/mL (ref 0.00–4.00)

## 2024-01-11 NOTE — Telephone Encounter (Signed)
 Patient's lab orders placed and appointment scheduled. In addition, Pt aware, agreeable, and verbalized understanding.

## 2024-01-11 NOTE — Telephone Encounter (Signed)
-----   Message from Marca Ancona sent at 01/10/2024  2:14 PM EDT ----- Good LDL, mildly elevated ALT.  Follow LFTs, repeat 1 month.

## 2024-01-11 NOTE — Progress Notes (Signed)
 PCP: Raliegh Ip, DO Cardiology: Dr. Wyline Mood HF Cardiology: Dr. Shirlee Latch  67 y.o.with history of paroxysmal atrial fibrillation, CAD, and chronic systolic CHF was referred by Dr. Wyline Mood for CHF clinic evaluation.    Patient had no cardiac history prior to 2/18.  In 2/18, he developed the onset of exertional dyspnea. After steadily worsening dyspnea, he went to the ER at [facility] and was admitted.  Echo in 2/18 showed EF < 20% with severe MR.  He went back into NSR spontaneously.  He followed up with Dr. Wyline Mood and was set up for a cath in 3/18.  He was found to have a chronically occluded LCx and a long proximal to mid LAD stenosis.  No intervention was done.  He was noted to be back in rapid atrial fibrillation and was admitted.  He was diuresed and started on amiodarone, he converted back to NSR again spontaneously.    He underwent successful CTO procedure in 8/18 with DES to LCx, DES to pLAD, DES to dLAD.    In 10/18, he had atrial fibrillation ablation.  Now off amiodarone.   Cardiac MRI in 12/18 showed EF up to 44%.  Echo in 12/19 showed EF 40%. Echo in 1/21 showed EF 35-40% with diffuse hypokinesis and normal RV.   Patient had a presyncopal episode at home 10/30/20.  He was lightheaded but did not pass out.  On his pulse ox, HR was down to 30.  He went to the ER, HR was in 60s in the ER.  He was sent home.  Echo in 1/22 showed EF 35-40% range with global hypokinesis, mild LVH, mildly decreased RV systolic function.  Cardiac MRI was done again to more closely quantify EF (?ICD), showed LV EF 51%, RVEF 48%, small area of anteroseptal LGE c/w prior MI.   Echo in 1/23 showed EF 45%, mild LVH, mildly decreased RV systolic function. Zio monitor in 8/23 showed 12% PVCs, amiodarone was started. Zio monitor repeated in 10/23 showed 6% PVCs.    Echo in 3/24 showed EF 50% with normal RV and normal IVC.   Echo was done today and reviewed, EF remains 50% with mild LVH, mildly decreased RV  systolic function.   He returns for followup of CHF.  He is working part-time now driving a school bus.  He is using CPAP.  Unfortunately, his wife passed away from cancer since that the last time I saw him.  He is coping pretty well and has 2 daughters who check in on him.  No dyspnea walking on flat ground.  Has some congestion and wheezing at times from seasonal allergies.  No chest pain.  No orthopnea/PND.    ECG (personally reviewed): NSR, right superior axis  Labs (4/18): K 4.5, creatinine 1.0 Labs (5/18): K 4.2, creatinine 1.25, LFTs normal, TSH normal Labs (6/18): LDL 14, HDL 28 Labs (8/18): K 4.1 => 3.9, creatinine 1.0 => 1.14, LFTs normal, TSH normal Labs (10/18): hgb 13.8, K 4.4, creatinine 1.02 Labs (1/19): hgb 13.6, TSH normal, K 4.6, creatinine 1.05 Labs (02/11/2018): K 4.2 Creatinine 0.99 Labs (9/19): K 4.6, creatinine 0.99, hgb 13.6, LDL 31, HDL 28 Labs (2/20): K 4.5, creatinine 0.86 Labs (7/20): K 4.4, creatinine 1.33 Labs (1/21): K 4.4, creatinine 1.04, LDL 40 Labs (4/21): K 4.5, creatinine 0.99 Labs (1/22): K 4.2, creatinine 0.99, LDL 51, HDL 27 Labs (4/22): K 4.4, creatinine 0.91 Labs (11/22): K 4.9, creatinine 0.98, LDL 25, TGs 164 Labs (6/23): K 4.8, creatinine  1.04 Labs (9/23): LDL 30 Labs (1/24): K 5.6, creatinine 1.19, LFTs normal, hgb 17 Labs (6/24): K 4.3, creatinine 1.28 Labs (9/24): K 4.2, creatinine 1.18, LFTs normal, TSH normal  PMH: 1. Atrial fibrillation: Paroxysmal.  2. 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  - Echo (2/18) with mild LV dilation, EF < 20%, severe mitral regurgitation.  - RHC/LHC (3/18): long 80% proximal-mid LAD stenosis, total occlusion of the LCx with left to left collaterals, RCA ok. Mean RA 7, PA 32/10, mean PCWP 16, LVEDP 12, CI 1.54.  - Echo (6/18) with EF 30-35%, diffuse hypokinesis, normal RV size and systolic function, trivial MR.   - Echo (12/18): EF 35%, diffuse hypokinesis, normal RV size with mildly decreased systolic function.  - Cardiac MRI (12/18): EF 44%, small area of LGE in mid anteroseptal wall suggestive of prior infarction. - Echo (12/19): EF 40%, diffuse hypokinesis, normal RV size with mildly decreased systolic function.    - Echo (8/11): EF 35-40%, diffuse hypokinesis, normal RV size and systolic function.  - Echo (1/22): EF 35-40% range with global hypokinesis, mild LVH, mildly decreased RV systolic function. - Cardiac MRI (3/22): LV EF 51%, RVEF 48%, small area of anteroseptal LGE c/w prior MI.  - Echo (1/23): EF 45%, mild LVH, mildly decreased RV systolic function.  - Echo (3/24): EF 50% with normal RV and normal IVC - Echo (3/25): EF remains 50% with mild LVH, mildly decreased RV systolic function.  3. CAD: LHC (3/18) with long 80% proximal-mid LAD stenosis, total occlusion of the LCx with left to left collaterals, RCA ok. - 8/18: CTO intervention with DES to LCx and DES x 2 to proximal and mid LAD.  4. Mitral regurgitation: Severe on 2/18 echo.  Suspect functional, as MR only trivial on 6/18 echo.  5. Sleep study (6/18) with no OSA but nocturnal hypoxemia.  He was instructed to start 2 liters oxygen at night but his insurance would not cover.   6. Allergic rhinitis 7. PVCs - Zio patch 12/19: 1.1% PVCs, 4 runs NSVT, longest 10 beats.  - Zio patch 2/22: 6% PVCs, 13 runs NSVT, longest 15 beats - Zio patch 8/23: 12% PVCs - Zio patch 10/23: 6% PVCs 8. Polyarthritis with +RF 9. OSA: CPAP  SH: Bus driver and school custodian, lives in [place], quit smoking in 2011, married.   FH: Grandfather with MIs, mother with CABG and CHF.   Review of systems complete and found to be negative unless listed in HPI.    Current Outpatient Medications  Medication Sig Dispense Refill   acetaminophen (TYLENOL) 500 MG tablet Take 1,000 mg by mouth every 6 (six) hours as needed for moderate pain, mild pain or  headache.     amiodarone (PACERONE) 100 MG tablet Take 1 tablet (100 mg total) by mouth daily. 90 tablet 3   atorvastatin (LIPITOR) 80 MG tablet TAKE 1 TABLET DAILY AT 6 P.M. 90 tablet 3   ELIQUIS 5 MG TABS tablet TAKE 1 TABLET TWICE A DAY 180 tablet 3   empagliflozin (JARDIANCE) 10 MG TABS tablet Take 1 tablet (10 mg total) by mouth daily before breakfast. 90 tablet 3   ENTRESTO 97-103 MG TAKE 1 TABLET TWICE A DAY 180 tablet 3   finasteride (PROSCAR) 5 MG tablet Take 1 tablet (5 mg total) by mouth daily. 90 tablet 0   fluticasone (FLONASE) 50 MCG/ACT nasal spray Place 1 spray into both nostrils daily as needed  for rhinitis.     furosemide (LASIX) 20 MG tablet TAKE 1 TABLET DAILY AS NEEDED 90 tablet 3   metoprolol succinate (TOPROL-XL) 100 MG 24 hr tablet Take 1 tablet (100 mg total) by mouth 2 (two) times daily. Take with or immediately following a meal. 180 tablet 2   spironolactone (ALDACTONE) 25 MG tablet TAKE 1 TABLET AT BEDTIME 90 tablet 3   Vitamin D3 (VITAMIN D) 25 MCG tablet Take 1,000 Units by mouth 4 (four) times a week. Sat, Sun, Tues and Thursday     No current facility-administered medications for this encounter.   BP 100/64   Pulse 67   Wt 124.7 kg (275 lb)   SpO2 97%   BMI 33.47 kg/m    Wt Readings from Last 3 Encounters:  01/10/24 124.7 kg (275 lb)  07/11/23 122 kg (269 lb)  01/02/23 132 kg (291 lb)    General: NAD Neck: No JVD, no thyromegaly or thyroid nodule.  Lungs: Clear to auscultation bilaterally with normal respiratory effort. CV: Nondisplaced PMI.  Heart regular S1/S2, no S3/S4, no murmur.  1+ ankle edema.  No carotid bruit.  Normal pedal pulses.  Abdomen: Soft, nontender, no hepatosplenomegaly, no distention.  Skin: Intact without lesions or rashes.  Neurologic: Alert and oriented x 3.  Psych: Normal affect. Extremities: No clubbing or cyanosis.  HEENT: Normal.   Assessment/Plan: 1. Atrial fibrillation: Paroxysmal.  Now s/p atrial fibrillation  ablation in 10/18.  NSR today.  He is on amiodarone (for PVCs primarily).  - Continue Eliquis for anticoagulation.    2. 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  Echo in 2/18 with EF < 20%.  EF 30-35% in 6/18 and 35% on repeat echo 12/18. Cardiac MRI in 12/18 to determine need for ICD showed EF 44%. Echo in 1/21 showed EF 35-40%.  Echo in 1/22 with EF about 35-40%.  Cardiac MRI in 3/22 showed LV EF up to 51%.  Echo in 1/23 with EF 45%.  Echo in 3/24 with EF up to 50%.  Echo in 3/25 with stable EF 50%.  NYHA class I-II symptoms.  He is not volume overloaded on exam.  - Continue Toprol XL 100 mg bid.  - Continue Entresto 97/103 bid.   - Continue spironolactone 25 daily. BMET today.  - Continue empagliflozin.  - Continue to use Lasix only prn.   3. CAD: s/p DES to LCx and LAD in 05/2017.  No chest pain.  - No ASA given stable CAD and use of Eliquis.  - Continue statin, check lipids today.  4. Mitral regurgitation: Minimal on 3/25 echo.  5. PVCs: 12% on 8/23 Zio patch, now back on amiodarone. Zio in 10/23 with PVCs down to 6%.  - Continue Toprol XL.  - Continue amiodarone 100 mg daily.  Check LFTs/TSH today, will need regular eye exam.  6. OSA: Using CPAP.  7. Obesity: He did not tolerate semaglutide.   Followup in 6 months with APP  I spent 31 minutes reviewing records, interviewing/examining patient, and managing orders.     Kevin Oconnell 01/11/2024

## 2024-01-19 ENCOUNTER — Other Ambulatory Visit (HOSPITAL_COMMUNITY): Payer: Self-pay | Admitting: Cardiology

## 2024-02-08 ENCOUNTER — Ambulatory Visit (HOSPITAL_COMMUNITY)
Admission: RE | Admit: 2024-02-08 | Discharge: 2024-02-08 | Disposition: A | Discharge status: Home / Self Care | Source: Ambulatory Visit | Attending: Cardiology | Admitting: Cardiology

## 2024-02-08 DIAGNOSIS — I5022 Chronic systolic (congestive) heart failure: Secondary | ICD-10-CM

## 2024-04-21 ENCOUNTER — Other Ambulatory Visit (HOSPITAL_COMMUNITY): Payer: Self-pay | Admitting: Cardiology

## 2024-06-06 ENCOUNTER — Encounter (HOSPITAL_COMMUNITY): Payer: Self-pay | Admitting: Cardiology

## 2024-06-26 ENCOUNTER — Other Ambulatory Visit (HOSPITAL_COMMUNITY): Payer: Self-pay | Admitting: Cardiology

## 2024-07-11 ENCOUNTER — Telehealth (HOSPITAL_COMMUNITY): Payer: Self-pay

## 2024-07-11 NOTE — Telephone Encounter (Signed)
 Called to confirm/remind patient of their appointment at the [facility] on 07/14/24.   Appointment:   [] Confirmed  [x] Left mess   [] No answer/No voice mail  [] VM Full/unable to leave message  [] Phone not in service  And to bring in all medications and/or complete list.

## 2024-07-11 NOTE — Progress Notes (Signed)
 PCP: No primary care provider on file. Cardiology: Dr. Alvan HF Cardiology: Dr. Rolan  68 y.o.with history of paroxysmal atrial fibrillation, CAD, and chronic systolic CHF was referred by Dr. Alvan for CHF clinic evaluation.    Patient had no cardiac history prior to 2/18.  In 2/18, he developed the onset of exertional dyspnea. After steadily worsening dyspnea, he went to the ER at [facility] and was admitted.  Echo in 2/18 showed EF < 20% with severe MR.  He went back into NSR spontaneously.  He followed up with Dr. Alvan and was set up for a cath in 3/18.  He was found to have a chronically occluded LCx and a long proximal to mid LAD stenosis.  No intervention was done.  He was noted to be back in rapid atrial fibrillation and was admitted.  He was diuresed and started on amiodarone , he converted back to NSR again spontaneously.    He underwent successful CTO procedure in 8/18 with DES to LCx, DES to pLAD, DES to dLAD.    In 10/18, he had atrial fibrillation ablation.  Now off amiodarone .   Cardiac MRI in 12/18 showed EF up to 44%.  Echo in 12/19 showed EF 40%. Echo in 1/21 showed EF 35-40% with diffuse hypokinesis and normal RV.   Patient had a presyncopal episode at home 10/30/20.  He was lightheaded but did not pass out.  On his pulse ox, HR was down to 30.  He went to the ER, HR was in 60s in the ER.  He was sent home.  Echo in 1/22 showed EF 35-40% range with global hypokinesis, mild LVH, mildly decreased RV systolic function.  Cardiac MRI was done again to more closely quantify EF (?ICD), showed LV EF 51%, RVEF 48%, small area of anteroseptal LGE c/w prior MI.   Echo in 1/23 showed EF 45%, mild LVH, mildly decreased RV systolic function. Zio monitor in 8/23 showed 12% PVCs, amiodarone  was started. Zio monitor repeated in 10/23 showed 6% PVCs.    Echo in 3/24 showed EF 50% with normal RV and normal IVC.   Echo 3/25 EF remains 50% with mild LVH, mildly decreased RV systolic  function.   Today he returns for HF follow up. Overall feeling fine. Physically limited by arthritis. He is more SOB today due to seasonal allergies, but otherwise no SOB carrying heavy grocery bags. Denies palpitations, abnormal bleeding, CP, dizziness, edema, or PND/Orthopnea. Appetite ok. Taking all medications. Wife passed from cancer 08/2023, he is thinking of moving back to [place] to be near daughters. 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 Retired [employer]  ECG (personally reviewed): none ordered today  Labs (1/24): K 5.6, creatinine 1.19, LFTs normal, hgb 17 Labs (6/24): K 4.3, creatinine 1.28 Labs (9/24): K 4.2, creatinine 1.18, LFTs normal, TSH normal Labs (3/25): K 4.3, creatinine 1.11, LDL 38, LFTs and TSH normal.  PMH: 1. Atrial fibrillation: Paroxysmal.  2. 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  - Echo (2/18) with mild LV dilation, EF < 20%, severe mitral regurgitation.  - RHC/LHC (3/18): long 80% proximal-mid LAD stenosis, total occlusion of the LCx with left to left collaterals, RCA ok. Mean RA 7, PA 32/10, mean PCWP 16, LVEDP 12, CI 1.54.  - Echo (6/18) with EF 30-35%, diffuse hypokinesis, normal RV size and systolic function, trivial MR.  - Echo (12/18): EF 35%, diffuse hypokinesis,  normal RV size with mildly decreased systolic function.  - Cardiac MRI (12/18): EF 44%, small area of LGE in mid anteroseptal wall suggestive of prior infarction. - Echo (12/19): EF 40%, diffuse hypokinesis, normal RV size with mildly decreased systolic function.    - Echo (8/78): EF 35-40%, diffuse hypokinesis, normal RV size and systolic function.  - Echo (1/22): EF 35-40% range with global hypokinesis, mild LVH, mildly decreased RV systolic function. - Cardiac MRI (3/22): LV EF 51%, RVEF 48%, small area of anteroseptal LGE c/w prior MI.  - Echo (1/23): EF 45%,  mild LVH, mildly decreased RV systolic function.  - Echo (3/24): EF 50% with normal RV and normal IVC - Echo (3/25): EF remains 50% with mild LVH, mildly decreased RV systolic function.  3. CAD: LHC (3/18) with long 80% proximal-mid LAD stenosis, total occlusion of the LCx with left to left collaterals, RCA ok. - 8/18: CTO intervention with DES to LCx and DES x 2 to proximal and mid LAD.  4. Mitral regurgitation: Severe on 2/18 echo.  Suspect functional, as MR only trivial on 6/18 echo.  5. Sleep study (6/18) with no OSA but nocturnal hypoxemia.  He was instructed to start 2 liters oxygen at night but his insurance would not cover.   6. Allergic rhinitis 7. PVCs - Zio patch 12/19: 1.1% PVCs, 4 runs NSVT, longest 10 beats.  - Zio patch 2/22: 6% PVCs, 13 runs NSVT, longest 15 beats - Zio patch 8/23: 12% PVCs - Zio patch 10/23: 6% PVCs 8. Polyarthritis with +RF 9. OSA: CPAP  SH: Bus driver and school custodian, lives in [place], quit smoking in 2011, married.   FH: Grandfather with MIs, mother with CABG and CHF.   Review of systems complete and found to be negative unless listed in HPI.    Current Outpatient Medications  Medication Sig Dispense Refill   acetaminophen  (TYLENOL ) 500 MG tablet Take 1,000 mg by mouth every 6 (six) hours as needed for moderate pain, mild pain or headache.     amiodarone  (PACERONE ) 100 MG tablet Take 1 tablet (100 mg total) by mouth daily. 90 tablet 3   atorvastatin  (LIPITOR ) 80 MG tablet TAKE 1 TABLET DAILY AT 6 P.M. 90 tablet 3   ELIQUIS  5 MG TABS tablet TAKE 1 TABLET TWICE A DAY 180 tablet 3   ENTRESTO  97-103 MG TAKE 1 TABLET TWICE A DAY 180 tablet 3   finasteride  (PROSCAR ) 5 MG tablet Take 1 tablet (5 mg total) by mouth daily. 90 tablet 0   fluticasone  (FLONASE ) 50 MCG/ACT nasal spray Place 1 spray into both nostrils daily as needed for rhinitis.     furosemide  (LASIX ) 20 MG tablet TAKE 1 TABLET DAILY AS NEEDED 90 tablet 3   JARDIANCE  10 MG TABS tablet  TAKE 1 TABLET DAILY BEFORE BREAKFAST 90 tablet 3   metoprolol  succinate (TOPROL -XL) 100 MG 24 hr tablet TAKE 1 TABLET TWICE A DAY WITH OR IMMEDIATELY FOLLOWING MEALS 180 tablet 3   spironolactone  (ALDACTONE ) 25 MG tablet TAKE 1 TABLET AT BEDTIME 90 tablet 3   Vitamin D3 (VITAMIN D ) 25 MCG tablet Take 1,000 Units by mouth 4 (four) times a week. Sat, Sun, Tues and Thursday     No current facility-administered medications for this encounter.   BP 110/84   Pulse 68   Wt 128.5 kg (283 lb 6.4 oz)   SpO2 96%   BMI 34.50 kg/m    Wt Readings from Last 3 Encounters:  07/14/24 128.5 kg (  283 lb 6.4 oz)  01/10/24 124.7 kg (275 lb)  07/11/23 122 kg (269 lb)    Physical Exam General:  NAD. No resp difficulty, walked into clinic HEENT: Normal Neck: Supple. No JVD. Thick neck Cor: Regular rate & rhythm. No rubs, gallops or murmurs. Lungs: Clear Abdomen: Soft, obese, nontender, nondistended.  Extremities: No cyanosis, clubbing, rash, edema Neuro: Alert & oriented x 3, moves all 4 extremities w/o difficulty. Affect pleasant.  Assessment/Plan: 1. Atrial fibrillation: Paroxysmal.  Now s/p atrial fibrillation ablation in 10/18.  He is on amiodarone  (for PVCs primarily). Regular on exam. - Continue Eliquis  for anticoagulation.    2. 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  Echo in 2/18 with EF < 20%.  EF 30-35% in 6/18 and 35% on repeat echo 12/18. Cardiac MRI in 12/18 to determine need for ICD showed EF 44%. Echo in 1/21 showed EF 35-40%.  Echo in 1/22 with EF about 35-40%.  Cardiac MRI in 3/22 showed LV EF up to 51%.  Echo in 1/23 with EF 45%.  Echo in 3/24 with EF up to 50%.  Echo in 3/25 with stable EF 50%.  NYHA class I-II symptoms.  He is not volume overloaded on exam.  - Continue Toprol  XL 100 mg bid.  - Continue Entresto  97/103 bid.   - Continue spironolactone  25 daily. BMET today.  - Continue  empagliflozin .  - Continue Lasix  20 mg PRN  - Update echo next visit 3. CAD: s/p DES to LCx and LAD in 05/2017.  No chest pain.  - No ASA given stable CAD and use of Eliquis .  - Continue atorvastatin  80 mg daily. 4. Mitral regurgitation: Minimal on 3/25 echo.  5. PVCs: 12% on 8/23 Zio patch, now back on amiodarone . Zio in 10/23 with PVCs down to 6%.  - Continue Toprol  XL.  - Continue amiodarone  100 mg daily.  Check LFTs/TSH today, will need regular eye exam.  6. OSA: Encouraged him to improve compliance with CPAP.  7. Obesity: Body mass index is 34.5 kg/m. - He did not tolerate semaglutide .   Follow up in 6 months with Dr. Rolan + echo.    Harlene HERO [facility] FNP-BC 07/14/2024

## 2024-07-14 ENCOUNTER — Ambulatory Visit (HOSPITAL_COMMUNITY): Payer: Self-pay | Admitting: Family Medicine

## 2024-07-14 ENCOUNTER — Encounter (HOSPITAL_COMMUNITY): Payer: Self-pay

## 2024-07-14 ENCOUNTER — Ambulatory Visit (HOSPITAL_COMMUNITY)
Admission: RE | Admit: 2024-07-14 | Discharge: 2024-07-14 | Disposition: A | Source: Ambulatory Visit | Attending: Family Medicine

## 2024-07-14 VITALS — BP 110/84 | HR 68 | Wt 283.4 lb

## 2024-07-14 DIAGNOSIS — I493 Ventricular premature depolarization: Secondary | ICD-10-CM | POA: Insufficient documentation

## 2024-07-14 DIAGNOSIS — Z7901 Long term (current) use of anticoagulants: Secondary | ICD-10-CM | POA: Diagnosis not present

## 2024-07-14 DIAGNOSIS — Z79899 Other long term (current) drug therapy: Secondary | ICD-10-CM | POA: Diagnosis not present

## 2024-07-14 DIAGNOSIS — Z6834 Body mass index (BMI) 34.0-34.9, adult: Secondary | ICD-10-CM | POA: Diagnosis not present

## 2024-07-14 DIAGNOSIS — I340 Nonrheumatic mitral (valve) insufficiency: Secondary | ICD-10-CM | POA: Insufficient documentation

## 2024-07-14 DIAGNOSIS — I480 Paroxysmal atrial fibrillation: Secondary | ICD-10-CM | POA: Insufficient documentation

## 2024-07-14 DIAGNOSIS — I2510 Atherosclerotic heart disease of native coronary artery without angina pectoris: Secondary | ICD-10-CM | POA: Insufficient documentation

## 2024-07-14 DIAGNOSIS — Z8249 Family history of ischemic heart disease and other diseases of the circulatory system: Secondary | ICD-10-CM | POA: Insufficient documentation

## 2024-07-14 DIAGNOSIS — Z955 Presence of coronary angioplasty implant and graft: Secondary | ICD-10-CM | POA: Diagnosis not present

## 2024-07-14 DIAGNOSIS — I5022 Chronic systolic (congestive) heart failure: Secondary | ICD-10-CM | POA: Diagnosis not present

## 2024-07-14 DIAGNOSIS — E669 Obesity, unspecified: Secondary | ICD-10-CM | POA: Insufficient documentation

## 2024-07-14 DIAGNOSIS — I429 Cardiomyopathy, unspecified: Secondary | ICD-10-CM | POA: Insufficient documentation

## 2024-07-14 DIAGNOSIS — G4733 Obstructive sleep apnea (adult) (pediatric): Secondary | ICD-10-CM | POA: Diagnosis not present

## 2024-07-14 LAB — TSH: TSH: 2.748 u[IU]/mL (ref 0.350–4.500)

## 2024-07-14 LAB — COMPREHENSIVE METABOLIC PANEL WITH GFR
ALT: 61 U/L — ABNORMAL HIGH (ref 0–44)
AST: 30 U/L (ref 15–41)
Albumin: 4 g/dL (ref 3.5–5.0)
Alkaline Phosphatase: 71 U/L (ref 38–126)
Anion gap: 10 (ref 5–15)
BUN: 11 mg/dL (ref 8–23)
CO2: 25 mmol/L (ref 22–32)
Calcium: 9.4 mg/dL (ref 8.9–10.3)
Chloride: 104 mmol/L (ref 98–111)
Creatinine, Ser: 1.11 mg/dL (ref 0.61–1.24)
GFR, Estimated: >60 mL/min (ref >60)
Glucose, Bld: 124 mg/dL — ABNORMAL HIGH (ref 70–99)
Potassium: 4.6 mmol/L (ref 3.5–5.1)
Sodium: 139 mmol/L (ref 135–145)
Total Bilirubin: 1.3 mg/dL — ABNORMAL HIGH (ref 0.0–1.2)
Total Protein: 6.7 g/dL (ref 6.5–8.1)

## 2024-07-14 NOTE — Patient Instructions (Addendum)
 Good to see you today!  You can try Zyrtec daily for allergies  Your physician has requested that you have an echocardiogram. Echocardiography is a painless test that uses sound waves to create images of your heart. It provides your doctor with information about the size and shape of your heart and how well your heart's chambers and valves are working. This procedure takes approximately one hour. There are no restrictions for this procedure. Please do NOT wear cologne, perfume, aftershave, or lotions (deodorant is allowed). Please arrive 15 minutes prior to your appointment time.  Please note: We ask at that you not bring children with you during ultrasound (echo/ vascular) testing. Due to room size and safety concerns, children are not allowed in the ultrasound rooms during exams. Our front office staff cannot provide observation of children in our lobby area while testing is being conducted. An adult accompanying a patient to their appointment will only be allowed in the ultrasound room at the discretion of the ultrasound technician under special circumstances. We apologize for any inconvenience.  Labs done today, your results will be available in MyChart, we will contact you for abnormal readings.  Your physician recommends that you schedule a follow-up appointment 6 months with echocardiogram(March 2026) Call office in January to schedule an appointment  If you have any questions or concerns before your next appointment please send us  a message through [place] or call our office at [phone].    TO LEAVE A MESSAGE FOR THE NURSE SELECT OPTION 2, PLEASE LEAVE A MESSAGE INCLUDING: YOUR NAME DATE OF BIRTH CALL BACK NUMBER REASON FOR CALL**this is important as we prioritize the call backs  YOU WILL RECEIVE A CALL BACK THE SAME DAY AS LONG AS YOU CALL BEFORE 4:00 PM At the [facility], you and your health needs are our priority. As part of our continuing mission to provide  you with exceptional heart care, we have created designated Provider Care Teams. These Care Teams include your primary Cardiologist (physician) and Advanced Practice Providers (APPs- Physician Assistants and Nurse Practitioners) who all work together to provide you with the care you need, when you need it.   You may see any of the following providers on your designated Care Team at your next follow up: Dr Toribio Fuel Dr Ezra Shuck Dr. Ria Commander Dr. Morene Brownie Amy Lenetta, NP Caffie Shed, [place] [facility] [place], [place] Beckey Coe, NP [place] Lee, NP Ellouise Class, NP Tinnie Redman, PharmD Jaun Bash, PharmD   Please be sure to bring in all your medications bottles to every appointment.    Thank you for choosing [facility]

## 2024-07-16 ENCOUNTER — Ambulatory Visit: Payer: Self-pay

## 2024-07-21 ENCOUNTER — Ambulatory Visit: Payer: Self-pay | Admitting: Family Medicine

## 2024-07-30 ENCOUNTER — Ambulatory Visit: Payer: Self-pay | Admitting: Family Medicine

## 2024-08-08 ENCOUNTER — Encounter: Payer: Self-pay | Admitting: Family Medicine

## 2024-08-08 ENCOUNTER — Ambulatory Visit (INDEPENDENT_AMBULATORY_CARE_PROVIDER_SITE_OTHER): Payer: Self-pay | Admitting: Family Medicine

## 2024-08-08 VITALS — BP 94/64 | HR 60 | Temp 97.3°F | Ht 76.0 in | Wt 285.0 lb

## 2024-08-08 DIAGNOSIS — R300 Dysuria: Secondary | ICD-10-CM | POA: Insufficient documentation

## 2024-08-08 DIAGNOSIS — M545 Low back pain, unspecified: Secondary | ICD-10-CM | POA: Insufficient documentation

## 2024-08-08 DIAGNOSIS — N400 Enlarged prostate without lower urinary tract symptoms: Secondary | ICD-10-CM | POA: Diagnosis not present

## 2024-08-08 DIAGNOSIS — M25519 Pain in unspecified shoulder: Secondary | ICD-10-CM | POA: Insufficient documentation

## 2024-08-08 DIAGNOSIS — I480 Paroxysmal atrial fibrillation: Secondary | ICD-10-CM

## 2024-08-08 DIAGNOSIS — Z23 Encounter for immunization: Secondary | ICD-10-CM

## 2024-08-08 DIAGNOSIS — Z6834 Body mass index (BMI) 34.0-34.9, adult: Secondary | ICD-10-CM

## 2024-08-08 DIAGNOSIS — E785 Hyperlipidemia, unspecified: Secondary | ICD-10-CM | POA: Insufficient documentation

## 2024-08-08 DIAGNOSIS — G4733 Obstructive sleep apnea (adult) (pediatric): Secondary | ICD-10-CM

## 2024-08-08 DIAGNOSIS — G959 Disease of spinal cord, unspecified: Secondary | ICD-10-CM | POA: Insufficient documentation

## 2024-08-08 DIAGNOSIS — H539 Unspecified visual disturbance: Secondary | ICD-10-CM

## 2024-08-08 DIAGNOSIS — D179 Benign lipomatous neoplasm, unspecified: Secondary | ICD-10-CM | POA: Insufficient documentation

## 2024-08-08 DIAGNOSIS — Z87898 Personal history of other specified conditions: Secondary | ICD-10-CM

## 2024-08-08 DIAGNOSIS — E669 Obesity, unspecified: Secondary | ICD-10-CM | POA: Insufficient documentation

## 2024-08-08 DIAGNOSIS — I5022 Chronic systolic (congestive) heart failure: Secondary | ICD-10-CM

## 2024-08-08 DIAGNOSIS — I10 Essential (primary) hypertension: Secondary | ICD-10-CM | POA: Insufficient documentation

## 2024-08-08 DIAGNOSIS — Z87891 Personal history of nicotine dependence: Secondary | ICD-10-CM

## 2024-08-08 DIAGNOSIS — M779 Enthesopathy, unspecified: Secondary | ICD-10-CM | POA: Insufficient documentation

## 2024-08-08 DIAGNOSIS — M1990 Unspecified osteoarthritis, unspecified site: Secondary | ICD-10-CM | POA: Insufficient documentation

## 2024-08-08 DIAGNOSIS — M0580 Other rheumatoid arthritis with rheumatoid factor of unspecified site: Secondary | ICD-10-CM

## 2024-08-08 DIAGNOSIS — G5623 Lesion of ulnar nerve, bilateral upper limbs: Secondary | ICD-10-CM

## 2024-08-08 DIAGNOSIS — Z130 Encounter for screening for diseases of the blood and blood-forming organs and certain disorders involving the immune mechanism: Secondary | ICD-10-CM

## 2024-08-08 DIAGNOSIS — Z136 Encounter for screening for cardiovascular disorders: Secondary | ICD-10-CM

## 2024-08-08 DIAGNOSIS — R361 Hematospermia: Secondary | ICD-10-CM | POA: Insufficient documentation

## 2024-08-08 LAB — BAYER DCA HB A1C WAIVED: HB A1C (BAYER DCA - WAIVED): 5.8 % — ABNORMAL HIGH (ref 4.8–5.6)
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
## 2024-08-09 LAB — CBC WITH DIFFERENTIAL/PLATELET
Basophils Absolute: 0 x10E3/uL (ref 0.0–0.2)
Basos: 1 %
EOS (ABSOLUTE): 0.2 x10E3/uL (ref 0.0–0.4)
Eos: 4 %
Hematocrit: 49.5 % (ref 37.5–51.0)
Hemoglobin: 16 g/dL (ref 13.0–17.7)
Immature Grans (Abs): 0 x10E3/uL (ref 0.0–0.1)
Immature Granulocytes: 0 %
Lymphocytes Absolute: 2.3 x10E3/uL (ref 0.7–3.1)
Lymphs: 34 %
MCH: 30.4 pg (ref 26.6–33.0)
MCHC: 32.3 g/dL (ref 31.5–35.7)
MCV: 94 fL (ref 79–97)
Monocytes Absolute: 0.4 x10E3/uL (ref 0.1–0.9)
Monocytes: 5 %
Neutrophils Absolute: 3.8 x10E3/uL (ref 1.4–7.0)
Neutrophils: 56 %
Platelets: 294 x10E3/uL (ref 150–450)
RBC: 5.27 x10E6/uL (ref 4.14–5.80)
RDW: 12.7 % (ref 11.6–15.4)
WBC: 6.8 x10E3/uL (ref 3.4–10.8)

## 2024-08-09 LAB — COMPREHENSIVE METABOLIC PANEL WITH GFR
ALT: 61 IU/L — ABNORMAL HIGH (ref 0–44)
AST: 28 IU/L (ref 0–40)
Albumin: 4.2 g/dL (ref 3.9–4.9)
Alkaline Phosphatase: 91 IU/L (ref 47–123)
BUN/Creatinine Ratio: 11 (ref 10–24)
BUN: 12 mg/dL (ref 8–27)
Bilirubin Total: 1 mg/dL (ref 0.0–1.2)
CO2: 23 mmol/L (ref 20–29)
Calcium: 9.3 mg/dL (ref 8.6–10.2)
Chloride: 103 mmol/L (ref 96–106)
Creatinine, Ser: 1.07 mg/dL (ref 0.76–1.27)
Globulin, Total: 1.8 g/dL (ref 1.5–4.5)
Glucose: 108 mg/dL — ABNORMAL HIGH (ref 70–99)
Potassium: 4.8 mmol/L (ref 3.5–5.2)
Sodium: 139 mmol/L (ref 134–144)
Total Protein: 6 g/dL (ref 6.0–8.5)
eGFR: 77 mL/min/1.73 (ref >59)

## 2024-08-09 LAB — LIPID PANEL
Chol/HDL Ratio: 2.8 ratio (ref 0.0–5.0)
Cholesterol, Total: 84 mg/dL — ABNORMAL LOW (ref 100–199)
HDL: 30 mg/dL — ABNORMAL LOW (ref >39)
LDL Chol Calc (NIH): 33 mg/dL (ref 0–99)
Triglycerides: 116 mg/dL (ref 0–149)
VLDL Cholesterol Cal: 21 mg/dL (ref 5–40)

## 2024-08-09 LAB — PSA: Prostate Specific Ag, Serum: 1.8 ng/mL (ref 0.0–4.0)

## 2024-08-12 ENCOUNTER — Ambulatory Visit: Payer: Self-pay | Admitting: Family Medicine

## 2024-08-12 ENCOUNTER — Telehealth: Payer: Self-pay

## 2024-08-12 NOTE — Telephone Encounter (Signed)
 Copied from CRM #[id number]. Topic: Clinical - Lab/Test Results >> Aug 12, 2024 12:09 PM Montie POUR wrote: Reason for CRM:  Kevin Oconnell called back and had no questions about his lab results. He did schedule his 1 month follow up for 09/16/24. Thanks

## 2024-08-12 NOTE — Telephone Encounter (Signed)
 Documented in a separate encounter. LS

## 2024-09-05 ENCOUNTER — Other Ambulatory Visit: Payer: Self-pay | Admitting: *Deleted

## 2024-09-05 ENCOUNTER — Telehealth: Payer: Self-pay | Admitting: *Deleted

## 2024-09-05 DIAGNOSIS — Z87891 Personal history of nicotine dependence: Secondary | ICD-10-CM

## 2024-09-05 DIAGNOSIS — Z122 Encounter for screening for malignant neoplasm of respiratory organs: Secondary | ICD-10-CM

## 2024-09-05 NOTE — Telephone Encounter (Signed)
 Lung Cancer Screening Narrative/Criteria Questionnaire (Cigarette Smokers Only- No Cigars/Pipes/vapes)   Kevin Oconnell   SDMV:09/22/24 9:30  [name]                                           11/02/1955              LDCT: 09/24/24 9:30-AP    68 y.o.   Phone: [phone]  Lung Screening Narrative (confirm age 52-77 yrs Medicare / 50-80 yrs Private pay insurance)   Insurance information:MCR   Referring Provider:Gregson   This screening involves an initial phone call with a team member from our program. It is called a shared decision making visit. The initial meeting is required by insurance and Medicare to make sure you understand the program. This appointment takes about 15-20 minutes to complete. The CT scan will completed at a separate date/time. This scan takes about 5-10 minutes to complete and you may eat and drink before and after the scan.  Criteria questions for Lung Cancer Screening:   Are you a current or former smoker? Former Age began smoking: 19   If you are a former smoker, what year did you quit smoking?  2011 (within 15 yrs)   To calculate your smoking history, I need an accurate estimate of how many packs of cigarettes you smoked per day and for how many years. (Not just the number of PPD you are now smoking)   Years smoking 35 x Packs per day 2-2.5 = Pack years 72   (at least 20 pack yrs)   (Make sure they understand that we need to know how much they have smoked in the past, not just the number of PPD they are smoking now)  Do you have a personal history of cancer?  No    Do you have a family history of cancer? No  Are you coughing up blood?  No  Have you had unexplained weight loss of 15 lbs or more in the last 6 months? No  It looks like you meet all criteria.     Additional information: N/A

## 2024-09-16 ENCOUNTER — Ambulatory Visit: Payer: Self-pay | Admitting: Family Medicine

## 2024-09-16 ENCOUNTER — Other Ambulatory Visit: Payer: Self-pay

## 2024-09-16 DIAGNOSIS — R748 Abnormal levels of other serum enzymes: Secondary | ICD-10-CM

## 2024-09-16 LAB — COMPREHENSIVE METABOLIC PANEL WITH GFR
ALT: 54 IU/L — ABNORMAL HIGH (ref 0–44)
AST: 28 IU/L (ref 0–40)
Albumin: 4.3 g/dL (ref 3.9–4.9)
Alkaline Phosphatase: 81 IU/L (ref 47–123)
BUN/Creatinine Ratio: 16 (ref 10–24)
BUN: 20 mg/dL (ref 8–27)
Bilirubin Total: 0.9 mg/dL (ref 0.0–1.2)
CO2: 22 mmol/L (ref 20–29)
Calcium: 9.6 mg/dL (ref 8.6–10.2)
Chloride: 106 mmol/L (ref 96–106)
Creatinine, Ser: 1.29 mg/dL — ABNORMAL HIGH (ref 0.76–1.27)
Globulin, Total: 1.9 g/dL (ref 1.5–4.5)
Glucose: 111 mg/dL — ABNORMAL HIGH (ref 70–99)
Potassium: 4.9 mmol/L (ref 3.5–5.2)
Sodium: 141 mmol/L (ref 134–144)
Total Protein: 6.2 g/dL (ref 6.0–8.5)
eGFR: 61 mL/min/1.73 (ref >59)

## 2024-09-17 ENCOUNTER — Ambulatory Visit: Payer: Self-pay | Admitting: Family Medicine

## 2024-09-17 NOTE — Progress Notes (Signed)
 Not seen

## 2024-09-22 ENCOUNTER — Encounter: Payer: Self-pay | Admitting: *Deleted

## 2024-09-22 ENCOUNTER — Ambulatory Visit: Admitting: *Deleted

## 2024-09-22 DIAGNOSIS — Z87891 Personal history of nicotine dependence: Secondary | ICD-10-CM | POA: Diagnosis not present

## 2024-09-22 NOTE — Patient Instructions (Signed)

## 2024-09-22 NOTE — Progress Notes (Signed)
   Virtual Visit via Telephone Note  I connected with Kevin Oconnell on 09/22/24 at  9:30 AM EST by telephone and verified that I am speaking with the correct person using two identifiers.  Location: Patient: at home Provider: [address] [place], [place], [address]    I discussed the limitations, risks, security and privacy concerns of performing an evaluation and management service by telephone and the availability of in person appointments. I also discussed with the patient that there may be a patient responsible charge related to this service. The patient expressed understanding and agreed to proceed.   Shared Decision Making Visit Lung Cancer Screening Program [phone])   Eligibility: Age 68 y.o. Pack Years Smoking History Calculation 70 (# packs/per year x # years smoked) Recent History of coughing up blood  no Unexplained weight loss? no ( >Than 15 pounds within the last 6 months ) Prior History Lung / other cancer no (Diagnosis within the last 5 years already requiring surveillance chest CT Scans). Smoking Status Former Smoker Former Smokers: Years since quit: 14 years  Quit Date: 2011  Visit Components: Discussion included one or more decision making aids. yes Discussion included risk/benefits of screening. yes Discussion included potential follow up diagnostic testing for abnormal scans. yes Discussion included meaning and risk of over diagnosis. yes Discussion included meaning and risk of False Positives. yes Discussion included meaning of total radiation exposure. yes  Counseling Included: Importance of adherence to annual lung cancer LDCT screening. yes Impact of comorbidities on ability to participate in the program. yes Ability and willingness to under diagnostic treatment. yes  Smoking Cessation Counseling: Current Smokers:  Discussed importance of smoking cessation. yes Information about tobacco cessation classes and interventions provided to  patient. yes Patient provided with ticket for LDCT Scan. no Symptomatic Patient. no   Diagnosis Code: Tobacco Use Z72.0 Asymptomatic Patient yes   Former Smokers:  Discussed the importance of maintaining cigarette abstinence. yes Diagnosis Code: Personal History of Nicotine Dependence. S12.108 Information about tobacco cessation classes and interventions provided to patient. Yes Patient provided with ticket for LDCT Scan. no Written Order for Lung Cancer Screening with LDCT placed in Epic. Yes (CT Chest Lung Cancer Screening Low Dose W/O CM) [id number] Z12.2-Screening of respiratory organs Z87.891-Personal history of nicotine dependence   Laneta Speaks, RN

## 2024-09-24 ENCOUNTER — Ambulatory Visit: Admitting: Surgical

## 2024-09-24 ENCOUNTER — Other Ambulatory Visit: Payer: Self-pay

## 2024-09-24 ENCOUNTER — Ambulatory Visit (HOSPITAL_COMMUNITY)
Admission: RE | Admit: 2024-09-24 | Discharge: 2024-09-24 | Disposition: A | Source: Ambulatory Visit | Attending: Acute Care | Admitting: Acute Care

## 2024-09-24 ENCOUNTER — Other Ambulatory Visit (HOSPITAL_COMMUNITY): Payer: Self-pay | Admitting: Cardiology

## 2024-09-24 DIAGNOSIS — R200 Anesthesia of skin: Secondary | ICD-10-CM

## 2024-09-24 DIAGNOSIS — M542 Cervicalgia: Secondary | ICD-10-CM | POA: Diagnosis not present

## 2024-09-24 DIAGNOSIS — Z122 Encounter for screening for malignant neoplasm of respiratory organs: Secondary | ICD-10-CM | POA: Insufficient documentation

## 2024-09-24 DIAGNOSIS — Z87891 Personal history of nicotine dependence: Secondary | ICD-10-CM | POA: Diagnosis present

## 2024-09-30 ENCOUNTER — Other Ambulatory Visit: Payer: Self-pay | Admitting: Acute Care

## 2024-09-30 DIAGNOSIS — Z87891 Personal history of nicotine dependence: Secondary | ICD-10-CM

## 2024-09-30 DIAGNOSIS — Z122 Encounter for screening for malignant neoplasm of respiratory organs: Secondary | ICD-10-CM

## 2024-10-01 ENCOUNTER — Other Ambulatory Visit: Payer: Self-pay | Admitting: Family Medicine

## 2024-10-01 DIAGNOSIS — I2510 Atherosclerotic heart disease of native coronary artery without angina pectoris: Secondary | ICD-10-CM

## 2024-10-01 DIAGNOSIS — I5022 Chronic systolic (congestive) heart failure: Secondary | ICD-10-CM

## 2024-10-01 MED ORDER — TIRZEPATIDE-WEIGHT MANAGEMENT 2.5 MG/0.5ML ~~LOC~~ SOAJ: 2.5000 mg | SUBCUTANEOUS | 0 refills | Status: AC

## 2024-10-02 NOTE — Telephone Encounter (Signed)
 FYI for PCP.

## 2024-10-05 ENCOUNTER — Encounter: Payer: Self-pay | Admitting: Surgical
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
## 2024-10-15 ENCOUNTER — Other Ambulatory Visit (HOSPITAL_COMMUNITY): Payer: Self-pay | Admitting: Cardiology

## 2024-10-19 ENCOUNTER — Other Ambulatory Visit: Payer: Self-pay | Admitting: Cardiology

## 2024-11-05 ENCOUNTER — Other Ambulatory Visit: Payer: Self-pay

## 2024-11-05 ENCOUNTER — Ambulatory Visit: Admitting: Surgical

## 2024-11-05 ENCOUNTER — Encounter: Payer: Self-pay | Admitting: Surgical

## 2024-11-05 DIAGNOSIS — R200 Anesthesia of skin: Secondary | ICD-10-CM | POA: Diagnosis not present

## 2024-11-05 MED ORDER — METHYLPREDNISOLONE ACETATE 40 MG/ML IJ SUSP
20.0000 mg | INTRAMUSCULAR | Status: AC | PRN
  Administered 2024-11-05: 20 mg

## 2024-11-05 MED ORDER — LIDOCAINE HCL 1 % IJ SOLN
3.0000 mL | INTRAMUSCULAR | Status: AC | PRN
  Administered 2024-11-05: 3 mL

## 2024-11-05 MED ORDER — BUPIVACAINE HCL 0.25 % IJ SOLN
0.5000 mL | INTRAMUSCULAR | Status: AC | PRN
  Administered 2024-11-05: .5 mL

## 2024-11-05 NOTE — Progress Notes (Signed)
" ° °  Procedure Note  Patient: Kevin Oconnell             Date of Birth: 12/13/1955           MRN: [id number]             Visit Date: 11/05/2024  Procedures: Visit Diagnoses:  1. Numbness in both hands     Right cubital tunnel injection on 11/05/2024 11:20 AM Indications: diagnostic, pain and therapeutic Details: 25 G needle, ultrasound-guided ulnar approach Medications: 3 mL lidocaine  1 %; 0.5 mL bupivacaine  0.25 %; 20 mg methylPREDNISolone  acetate 40 MG/ML Outcome: tolerated well, no immediate complications Procedure, treatment alternatives, risks and benefits explained, specific risks discussed. Consent was given by the patient. Immediately prior to procedure a time out was called to verify the correct patient, procedure, equipment, support staff and site/side marked as required. Patient was prepped and draped in the usual sterile fashion.        "

## 2024-11-07 ENCOUNTER — Encounter: Payer: Self-pay | Admitting: Urology

## 2024-11-07 ENCOUNTER — Telehealth: Payer: Self-pay | Admitting: Surgical

## 2024-11-07 ENCOUNTER — Ambulatory Visit (INDEPENDENT_AMBULATORY_CARE_PROVIDER_SITE_OTHER): Admitting: Urology

## 2024-11-07 VITALS — BP 99/59 | HR 69

## 2024-11-07 DIAGNOSIS — N401 Enlarged prostate with lower urinary tract symptoms: Secondary | ICD-10-CM

## 2024-11-07 DIAGNOSIS — R972 Elevated prostate specific antigen [PSA]: Secondary | ICD-10-CM

## 2024-11-07 DIAGNOSIS — R351 Nocturia: Secondary | ICD-10-CM | POA: Diagnosis not present

## 2024-11-07 LAB — URINALYSIS, ROUTINE W REFLEX MICROSCOPIC
Bilirubin, UA: NEGATIVE
Ketones, UA: NEGATIVE
Leukocytes,UA: NEGATIVE
Nitrite, UA: NEGATIVE
Protein,UA: NEGATIVE
RBC, UA: NEGATIVE
Specific Gravity, UA: 1.01 (ref 1.005–1.030)
Urobilinogen, Ur: 1 mg/dL (ref 0.2–1.0)
pH, UA: 5.5 (ref 5.0–7.5)

## 2024-11-07 LAB — BLADDER SCAN AMB NON-IMAGING: Scan Result: 4

## 2024-11-07 MED ORDER — ALFUZOSIN HCL ER 10 MG PO TB24: 10.0000 mg | ORAL_TABLET | Freq: Every day | ORAL | 11 refills | Status: DC

## 2024-11-07 NOTE — Patient Instructions (Signed)
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
## 2024-11-07 NOTE — Progress Notes (Signed)
   Patient can void prior to the bladder scan. Bladder scan result: 4  Performed By: [facility] LPN

## 2024-11-07 NOTE — Telephone Encounter (Signed)
 Pt called saying that the injection worked well and wants to proceed with the next one. Call back number is [phone].

## 2024-11-12 NOTE — Telephone Encounter (Signed)
 Called and scheduled

## 2024-11-18 ENCOUNTER — Other Ambulatory Visit: Payer: Self-pay

## 2024-11-18 ENCOUNTER — Telehealth: Payer: Self-pay | Admitting: Urology

## 2024-11-18 ENCOUNTER — Telehealth: Payer: Self-pay

## 2024-11-18 DIAGNOSIS — N401 Enlarged prostate with lower urinary tract symptoms: Secondary | ICD-10-CM

## 2024-11-18 MED ORDER — ALFUZOSIN HCL ER 10 MG PO TB24: 10.0000 mg | ORAL_TABLET | Freq: Every day | ORAL | 3 refills | Status: AC

## 2024-11-18 NOTE — Telephone Encounter (Signed)
 Needs 90 day supply not 30 day he pays more for 30 day, [facility]

## 2024-11-18 NOTE — Telephone Encounter (Signed)
 Patient left a voice message 11-17-2024.  Requesting alfuzosin  (UROXATRAL ) 10 MG 24 hr tablet be changed to a 90 day supply to [facility].  Please advise.  Call:  [phone]

## 2024-11-18 NOTE — Telephone Encounter (Signed)
Tried calling pt with no answer. 

## 2024-11-19 ENCOUNTER — Ambulatory Visit: Admitting: Surgical

## 2024-11-19 ENCOUNTER — Other Ambulatory Visit: Payer: Self-pay

## 2024-11-19 ENCOUNTER — Encounter: Payer: Self-pay | Admitting: Surgical

## 2024-11-19 DIAGNOSIS — R200 Anesthesia of skin: Secondary | ICD-10-CM

## 2024-11-19 MED ORDER — TRIAMCINOLONE ACETONIDE 40 MG/ML IJ SUSP
20.0000 mg | INTRAMUSCULAR | Status: AC | PRN
  Administered 2024-11-19: 20 mg

## 2024-11-19 MED ORDER — LIDOCAINE HCL 1 % IJ SOLN
3.0000 mL | INTRAMUSCULAR | Status: AC | PRN
  Administered 2024-11-19: 3 mL

## 2024-11-19 MED ORDER — BUPIVACAINE HCL 0.25 % IJ SOLN
0.5000 mL | INTRAMUSCULAR | Status: AC | PRN
  Administered 2024-11-19: .5 mL

## 2024-11-19 NOTE — Progress Notes (Cosign Needed)
" ° °  Procedure Note  Patient: Kevin Oconnell             Date of Birth: 07-14-1956           MRN: [id number]             Visit Date: 11/19/2024  Procedures: Visit Diagnoses:  1. Numbness in both hands     Left cubital tunnel injection on 11/19/2024 10:05 AM Indications: diagnostic, pain and therapeutic Details: 25 G needle, ultrasound-guided volar approach Medications: 3 mL lidocaine  1 %; 0.5 mL bupivacaine  0.25 %; 20 mg triamcinolone  acetonide 40 MG/ML Outcome: tolerated well, no immediate complications  Patient returns following right cubital tunnel injection which gave him about 90% of relief of all of his right upper extremity symptoms.  He is here today for similar injection in the left cubital tunnel.  This was administered under ultrasound guidance and care was taken to avoid needle penetration into the ulnar nerve.  Tolerated procedure well without complication.  He is planning to go back to [place]  in May or June so we will see how these injections do for him and based on how long they last, next step would be either repeating the injection either 1 more time max versus consultation with hand surgery versus neurosurgery for ulnar nerve decompression Procedure, treatment alternatives, risks and benefits explained, specific risks discussed. Consent was given by the patient. Immediately prior to procedure a time out was called to verify the correct patient, procedure, equipment, support staff and site/side marked as required. Patient was prepped and draped in the usual sterile fashion.        "

## 2024-12-02 ENCOUNTER — Ambulatory Visit

## 2024-12-11 ENCOUNTER — Ambulatory Visit (HOSPITAL_COMMUNITY)

## 2025-02-06 ENCOUNTER — Ambulatory Visit: Payer: Self-pay | Admitting: Family Medicine

## 2025-05-21 ENCOUNTER — Other Ambulatory Visit

## 2025-05-27 ENCOUNTER — Ambulatory Visit: Admitting: Urology
